# Patient Record
Sex: Female | Born: 1935 | Race: White | Hispanic: No | Smoking: Former smoker
Health system: Southern US, Community
[De-identification: ages and names within clinical notes are randomized; demographics above are authoritative.]

## PROBLEM LIST (undated history)

## (undated) DIAGNOSIS — I739 Peripheral vascular disease, unspecified: Secondary | ICD-10-CM

## (undated) DIAGNOSIS — J449 Chronic obstructive pulmonary disease, unspecified: Secondary | ICD-10-CM

## (undated) HISTORY — DX: Peripheral vascular disease, unspecified: I73.9

---

## 1968-11-02 HISTORY — PX: TUBAL LIGATION: SHX77

## 1978-11-02 HISTORY — PX: ABDOMINAL HYSTERECTOMY: SHX81

## 1998-05-17 ENCOUNTER — Ambulatory Visit: Admission: RE | Admit: 1998-05-17 | Discharge: 1998-05-17 | Payer: Self-pay | Admitting: Pulmonary Disease

## 1999-02-03 ENCOUNTER — Other Ambulatory Visit: Admission: RE | Admit: 1999-02-03 | Discharge: 1999-02-03 | Payer: Self-pay | Admitting: Gynecology

## 2000-06-18 ENCOUNTER — Encounter: Payer: Self-pay | Admitting: Internal Medicine

## 2000-06-18 ENCOUNTER — Encounter: Admission: RE | Admit: 2000-06-18 | Discharge: 2000-06-18 | Payer: Self-pay | Admitting: Internal Medicine

## 2000-07-12 ENCOUNTER — Other Ambulatory Visit: Admission: RE | Admit: 2000-07-12 | Discharge: 2000-07-12 | Payer: Self-pay | Admitting: Gynecology

## 2001-09-01 ENCOUNTER — Other Ambulatory Visit: Admission: RE | Admit: 2001-09-01 | Discharge: 2001-09-01 | Payer: Self-pay | Admitting: Gynecology

## 2001-09-08 ENCOUNTER — Encounter: Payer: Self-pay | Admitting: Gynecology

## 2001-09-08 ENCOUNTER — Encounter: Admission: RE | Admit: 2001-09-08 | Discharge: 2001-09-08 | Payer: Self-pay | Admitting: Gynecology

## 2001-12-10 ENCOUNTER — Encounter: Payer: Self-pay | Admitting: Emergency Medicine

## 2001-12-10 ENCOUNTER — Emergency Department (HOSPITAL_COMMUNITY): Admission: EM | Admit: 2001-12-10 | Discharge: 2001-12-11 | Payer: Self-pay | Admitting: Emergency Medicine

## 2001-12-11 ENCOUNTER — Encounter: Payer: Self-pay | Admitting: Emergency Medicine

## 2001-12-13 ENCOUNTER — Inpatient Hospital Stay (HOSPITAL_COMMUNITY): Admission: EM | Admit: 2001-12-13 | Discharge: 2001-12-22 | Payer: Self-pay | Admitting: Emergency Medicine

## 2001-12-13 ENCOUNTER — Encounter (INDEPENDENT_AMBULATORY_CARE_PROVIDER_SITE_OTHER): Payer: Self-pay

## 2001-12-13 ENCOUNTER — Encounter: Payer: Self-pay | Admitting: Emergency Medicine

## 2001-12-15 ENCOUNTER — Encounter: Payer: Self-pay | Admitting: Critical Care Medicine

## 2001-12-16 ENCOUNTER — Encounter: Payer: Self-pay | Admitting: Internal Medicine

## 2001-12-19 ENCOUNTER — Encounter: Payer: Self-pay | Admitting: Critical Care Medicine

## 2001-12-20 ENCOUNTER — Encounter: Payer: Self-pay | Admitting: Critical Care Medicine

## 2001-12-21 ENCOUNTER — Encounter: Payer: Self-pay | Admitting: Critical Care Medicine

## 2001-12-24 ENCOUNTER — Inpatient Hospital Stay (HOSPITAL_COMMUNITY): Admission: EM | Admit: 2001-12-24 | Discharge: 2002-01-03 | Payer: Self-pay | Admitting: Emergency Medicine

## 2001-12-24 ENCOUNTER — Encounter: Payer: Self-pay | Admitting: Emergency Medicine

## 2001-12-26 ENCOUNTER — Encounter: Payer: Self-pay | Admitting: Pulmonary Disease

## 2001-12-27 ENCOUNTER — Encounter: Payer: Self-pay | Admitting: Critical Care Medicine

## 2002-09-07 ENCOUNTER — Other Ambulatory Visit: Admission: RE | Admit: 2002-09-07 | Discharge: 2002-09-07 | Payer: Self-pay | Admitting: Gynecology

## 2002-09-18 ENCOUNTER — Encounter: Payer: Self-pay | Admitting: Gynecology

## 2002-09-18 ENCOUNTER — Encounter: Admission: RE | Admit: 2002-09-18 | Discharge: 2002-09-18 | Payer: Self-pay | Admitting: Gynecology

## 2003-10-23 ENCOUNTER — Encounter: Admission: RE | Admit: 2003-10-23 | Discharge: 2003-10-23 | Payer: Self-pay | Admitting: Gynecology

## 2004-09-18 ENCOUNTER — Encounter: Admission: RE | Admit: 2004-09-18 | Discharge: 2004-09-18 | Payer: Self-pay | Admitting: Internal Medicine

## 2004-09-23 ENCOUNTER — Inpatient Hospital Stay (HOSPITAL_COMMUNITY): Admission: EM | Admit: 2004-09-23 | Discharge: 2004-09-26 | Payer: Self-pay | Admitting: Emergency Medicine

## 2004-09-23 ENCOUNTER — Ambulatory Visit: Payer: Self-pay | Admitting: Internal Medicine

## 2004-10-01 ENCOUNTER — Ambulatory Visit: Payer: Self-pay | Admitting: Internal Medicine

## 2004-10-15 ENCOUNTER — Ambulatory Visit: Payer: Self-pay | Admitting: Internal Medicine

## 2004-11-12 ENCOUNTER — Ambulatory Visit: Payer: Self-pay | Admitting: Internal Medicine

## 2004-11-20 ENCOUNTER — Encounter: Admission: RE | Admit: 2004-11-20 | Discharge: 2004-11-20 | Payer: Self-pay | Admitting: Gynecology

## 2004-11-28 ENCOUNTER — Ambulatory Visit: Payer: Self-pay | Admitting: Internal Medicine

## 2004-12-12 ENCOUNTER — Ambulatory Visit: Payer: Self-pay | Admitting: Internal Medicine

## 2005-02-02 ENCOUNTER — Ambulatory Visit: Payer: Self-pay | Admitting: Internal Medicine

## 2005-03-17 ENCOUNTER — Ambulatory Visit: Payer: Self-pay | Admitting: Internal Medicine

## 2005-06-05 ENCOUNTER — Ambulatory Visit: Payer: Self-pay | Admitting: Internal Medicine

## 2005-06-25 ENCOUNTER — Other Ambulatory Visit: Admission: RE | Admit: 2005-06-25 | Discharge: 2005-06-25 | Payer: Self-pay | Admitting: Gynecology

## 2005-09-03 ENCOUNTER — Ambulatory Visit: Payer: Self-pay | Admitting: Internal Medicine

## 2005-10-29 ENCOUNTER — Ambulatory Visit: Payer: Self-pay | Admitting: Internal Medicine

## 2005-12-10 ENCOUNTER — Ambulatory Visit: Payer: Self-pay | Admitting: Internal Medicine

## 2006-01-06 ENCOUNTER — Encounter: Admission: RE | Admit: 2006-01-06 | Discharge: 2006-01-06 | Payer: Self-pay | Admitting: Gynecology

## 2006-02-12 ENCOUNTER — Ambulatory Visit: Payer: Self-pay | Admitting: Internal Medicine

## 2006-04-22 ENCOUNTER — Ambulatory Visit: Payer: Self-pay | Admitting: Internal Medicine

## 2006-07-06 ENCOUNTER — Ambulatory Visit: Payer: Self-pay | Admitting: Internal Medicine

## 2006-07-16 ENCOUNTER — Ambulatory Visit: Payer: Self-pay | Admitting: Pulmonary Disease

## 2006-07-26 ENCOUNTER — Ambulatory Visit: Payer: Self-pay | Admitting: Internal Medicine

## 2006-08-24 ENCOUNTER — Ambulatory Visit: Payer: Self-pay | Admitting: Internal Medicine

## 2006-10-05 ENCOUNTER — Ambulatory Visit: Payer: Self-pay | Admitting: Internal Medicine

## 2006-10-21 ENCOUNTER — Ambulatory Visit: Payer: Self-pay | Admitting: Internal Medicine

## 2006-12-02 ENCOUNTER — Encounter: Admission: RE | Admit: 2006-12-02 | Discharge: 2006-12-02 | Payer: Self-pay | Admitting: Internal Medicine

## 2006-12-03 ENCOUNTER — Emergency Department (HOSPITAL_COMMUNITY): Admission: EM | Admit: 2006-12-03 | Discharge: 2006-12-03 | Payer: Self-pay | Admitting: Emergency Medicine

## 2007-01-19 ENCOUNTER — Ambulatory Visit: Payer: Self-pay | Admitting: Internal Medicine

## 2007-03-23 ENCOUNTER — Encounter: Admission: RE | Admit: 2007-03-23 | Discharge: 2007-03-23 | Payer: Self-pay | Admitting: Gynecology

## 2007-09-05 DIAGNOSIS — J438 Other emphysema: Secondary | ICD-10-CM

## 2007-09-05 DIAGNOSIS — E039 Hypothyroidism, unspecified: Secondary | ICD-10-CM | POA: Insufficient documentation

## 2007-09-05 DIAGNOSIS — J209 Acute bronchitis, unspecified: Secondary | ICD-10-CM

## 2007-09-05 DIAGNOSIS — J449 Chronic obstructive pulmonary disease, unspecified: Secondary | ICD-10-CM

## 2007-09-05 DIAGNOSIS — M26629 Arthralgia of temporomandibular joint, unspecified side: Secondary | ICD-10-CM

## 2008-01-21 ENCOUNTER — Emergency Department (HOSPITAL_COMMUNITY): Admission: EM | Admit: 2008-01-21 | Discharge: 2008-01-21 | Payer: Self-pay | Admitting: Emergency Medicine

## 2008-04-10 ENCOUNTER — Encounter: Admission: RE | Admit: 2008-04-10 | Discharge: 2008-04-10 | Payer: Self-pay | Admitting: Gynecology

## 2009-04-18 ENCOUNTER — Encounter: Admission: RE | Admit: 2009-04-18 | Discharge: 2009-04-18 | Payer: Self-pay | Admitting: Gynecology

## 2010-03-15 ENCOUNTER — Emergency Department (HOSPITAL_COMMUNITY): Admission: EM | Admit: 2010-03-15 | Discharge: 2010-03-15 | Payer: Self-pay | Admitting: Emergency Medicine

## 2010-04-25 ENCOUNTER — Encounter: Admission: RE | Admit: 2010-04-25 | Discharge: 2010-04-25 | Payer: Self-pay | Admitting: Gynecology

## 2011-03-20 NOTE — Discharge Summary (Signed)
[facility]  Patient:    Sharon Mcdaniel, Sharon Mcdaniel Visit Number: [id number] MRN: [id number]          Service Type: MED Location: [id number] [phone] Attending Physician:  Sharon Mcdaniel Dictated by:   Sharon Favor, RN, MSN, ACNP Admit Date:  12/24/2001 Discharge Date: 01/03/2002   CC:         Sharon Mcdaniel. Sharon Mcdaniel., M.D.   Discharge Summary  DATE OF BIRTH:  19-Feb-1936.  DISCHARGE DIAGNOSIS: 1. Chronic obstructive pulmonary disease. 2. 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 3. Allergic reaction.  HISTORY OF PRESENT ILLNESS:  The patient is a 75 year old white female patient of Sharon Mcdaniel who was discharged from the hospital December 22, 2001, after a prolonged hospitalization of pneumonia complicated by acute lung injury and asthmatic bronchitis.  She was seen in the office following discharge and at that time had developed a drug rash that was noted to be on the torso.  The majority of her pharmaceuticals were discontinued at that time including hydrocodone, vioxx, Diflucan, and tequin.  Despite the change in her medications she became extremely arrhythmic to the point of being painful. She also became anxious and quite fearful, therefore she was admitted for further evaluation and treatment.  LABORATORY DATA:  ANA is negative.  WBC is 24.1, hemoglobin 12.0, hematocrit 34.9, white blood cell 455, ESR is 30.  Sodium 131, potassium 4.0, chloride 97, CO2 is 27, glucose 181.  BUN 16, creatinine 0.8, calcium 8.1, total protein 5.5, albumin 2.3, AST 33, ALT 44, ALP 110, total bilirubin 0.4, IgE serum is 10.2.  Chest x-ray 2 views shows slight suggestion of some improvement of aeration on the right with an area of patchy dense lesions in the left upper lobe regions which may represent increased bronchopneumonia, dated December 26, 2001.  CT of the chest on December 27, 2001, further improvement of right upper  lobe pneumonia, almost complete, clearing the right middle lobe pneumonia, increased interstitial pneumonitis in both upper lobes in the superior segment of the left lower lobe and may be secondary to patients allergic reaction to antimicrobial therapy.  HOSPITAL COURSE:  #1 - CHRONIC OBSTRUCTIVE PULMONARY DISEASE WITH RECENT PNEUMONIA:  Sharon Mcdaniel was admitted to [facility] after having been treated earlier in the month with pneumonia and acute lung injury.  She continued on prednisone therapy and it was no longer on antimicrobial therapy secondary to an allergic reaction.  She was treated with nebulized bronchodilators along with prednisone therapy and required oxygen therapy for O2 saturations of 86 on room air.  She reached maximal hospital benefit by discharge day and was discharged home in improved condition.  #2 - ACUTE LUNG INJURY VERSUS BRONCHIOLITIS OBLITERANS ORGANIZED PNEUMONIA: The patient was started on high dose steroids for suspected BOOP.  She was continued on a very slow steroid taper after discharge.  CT scan was suggestive of allergic respiratory inflammatory response therefore again she will be on a slow steroid taper.  #3 - ALLERGIC SKIN REACTION:  The patient was evaluated in the office and in the hospital with a truncal rash that was extremely uncomfortable for her. She started on high dose IV steroids and then on slow p.o. steroid taper.  Her rash that was truncal and maculopapular in nature had been completely obliterated by the day of discharge and therefore no longer a contributing factor to her illness by the day of discharge.  MEDICATIONS: 1. Oxygen 2 liters 24 hours a day.  2. Albuterol 2.5 hand-held nebulizer 4 times a day. 3. Atrovent 0.5 hand-held nebulizer 4 times a day. 4. Synthroid 125 mcg 1 q.d. 5. Ventolin 20 mg 1 q.d. 6. Climara patch 0.05 mg change daily. 7. Atarax 50 mg 1 tab q.8h. p.r.n. as needed for rash. 8. Prednisone 10 mg  tablets 40 mg for 5 days, 35 mg for 5 days, 30 mg for 5    days, 25 mg for 5 days, 20 mg a day and then stay.  ACTIVITY:  As tolerated.  DIET:  No restriction.  WOUND CARE:  Not applicable.  FOLLOWUP:  Follow up with Sharon Favor, RN, MSN, ACNP, on January 06, 2002.  CONDITION ON DISCHARGE:  She was discharged home in improved condition.  Her rash is completely resolved.  Her hypoxia has been addressed with the continuation of her oxygen.  Her chronic obstructive pulmonary disease has been addressed with nebulized bronchodilators.  She is discharged home in improved condition. Dictated by:   Sharon Favor, RN, MSN, ACNP Attending Physician:  Sharon Mcdaniel DD:  01/09/02 TD:  01/09/02 Job: [id number]

## 2011-03-20 NOTE — Assessment & Plan Note (Signed)
[facility]                               PULMONARY OFFICE NOTE   Sharon Mcdaniel, Sharon Mcdaniel                        MRN:          [id number]  DATE:07/26/2006                            DOB:          Oct 16, 1936    This is a pulmonary/acute extended office visit.   HISTORY:  A 75 year old white female who was just seen on September 4 with  relatively well controlled COPD, albeit with a 15% improvement after  bronchodilators consistent with an asthmatic component for which she has  been maintained on Pulmicort nebulizer 0.25 mg b.i.d. along with DuoNeb  b.i.d. plus extra doses p.r.n.  She comes in today acutely worse since the  13th with sore throat, congestion, fever, yellow sputum, for which she was  seen by a nurse practitioner on the 14th and given a course of Omnicef.  She  had already placed herself on a tapering course of prednisone, Mucinex DM,  and tramadol.   She comes back today saying she is no better.  Most of her problem is loss  of voice now and also sensation of head and sinus congestion.  She denies  any dyspnea, fever, chills, sweats, orthopnea, PND, chest pain, or leg  swelling.   For a full list of her medications, please see face sheet dated July 26, 2006.   PHYSICAL EXAMINATION:  GENERAL:  She is a pleasant, ambulatory white female  in no acute distress.  VITAL SIGNS:  She is afebrile with normal vital signs.  HEENT:  Moderate __________ oropharynx.  She does have upper and lower  dentures in place but no thrush.  NECK:  Supple without cervical adenopathy or tinnitus.  LUNGS:  Lung fields reveal inspiratory and expiratory wheezes that are  mostly upper in airway and more pseudo than true.  HEART:  Regular rhythm without murmur, rub or gallop.  ABDOMEN:  Soft, benign.  EXTREMITIES:  Warm without calf tenderness, clubbing, cyanosis or edema.   IMPRESSION:  Upper and lower airway instability, consistent with rhinitis,  sinusitis, and a component of asthmatic bronchitis in a patient previously  documented to have chronic obstructive pulmonary disease with significant  reversible component despite being maintained on Pulmicort, albeit in low  doses, by our most recent PFTs dated July 06, 2006.   There are multiple issues today that I discussed with the patient as follows  and in writing:  1)  We have not eradicated the purulent secretions from her  head, although she has no sore throat of fever, she continues to have marked  hoarseness and yellow secretions.  I recommended Levaquin 750 for five more  days and then follow up with a sinus CT scan if her symptoms are not better.  2)  Chronic obstructive pulmonary disease with an asthma exacerbation.  I  reviewed with her an optimal way to taper prednisone off.  3)  Because there  is so much upper airway stability, she needs to continue to use Zegerid, not  only when she has symptoms of acid reflux but also for any upper respiratory  symptoms.  I specifically gave her samples and asked her to take 40 mg per  day until completely better off prednisone and back to baseline.  4)  Finally, because she still has an asthmatic component based on her most  recent PFTs, I am going to increase the Pulmicort dose to 0.5 b.i.d.  and  review this issue as well with her in writing.   Followup will be at her regularly scheduled appointment unless she fails to  respond to the above measures back to baseline.                                   Sharon Mcdaniel. Sharon Sires, MD, [facility]   MBW/MedQ  DD:  07/26/2006  DT:  07/27/2006  Job #:  [id number]   cc:   [name], M.D.

## 2011-03-20 NOTE — H&P (Signed)
[facility]  Patient:    Sharon Mcdaniel, Sharon Mcdaniel Visit Number: [id number] MRN: [id number]          Service Type: MED Location: [phone] Attending Physician:  Caleb Popp Dictated by:   Oley Balm Sung Amabile, M.D. [facility] Admit Date:  12/24/2001   CC:         Sharon Mcdaniel. Sharon Mcdaniel., M.D.  Sharon Mcdaniel, M.D. [facility]   History and Physical  DATE OF BIRTH:  May 20, 1936  ADMISSION DIAGNOSIS:  Severe drug eruption.  HISTORY OF PRESENT ILLNESS:  [name] was discharged home on December 22, 2001, after a prolonged hospitalization for pneumonia complicated by acute lung injury and asthmatic bronchitis.  On the day following discharge and on the day prior to this admission she was seen in the office with a mild rash. It was felt to be a drug rash, and she was told to discontinue the antibiotic, Tequin.  She was also told to discontinue hydrocodone, Vioxx, and Diflucan. The rash continued to worsen and became extremely erythematous and painful. It affects her trunk predominantly.  It also itches.  She was instructed on the day prior to this evaluation to come to the emergency department if she developed any shortness of breath.  She did, indeed, develop shortness of breath on the morning of admission and, consequently, was seen in the emergency department where she was noted to have room air oxygen saturation of 89%.  This improved some after nebulized bronchodilators.  At this point she is quite anxious and fearful of going back home until the present problem resolves sufficiently.  Therefore, she is admitted for further evaluation and management.  She denies any further fevers, chills, or sweats.  She has no chest pain, pressure, tightness, or heaviness.  There is no cough or sputum production.  No nausea, vomiting, diarrhea, or dysuria.  She does note generalized weakness since her discharge, not surprising after a 12-day hospitalization with a serious  illness.  She also notes dyspnea, as described above.  PAST MEDICAL HISTORY: 1. Hypothyroidism. 2. Asthmatic bronchitis. 3. Also during her hospitalization she did have syndrome of inappropriate    antidiuretic hormone secretion which was mild and was resolving by the    time of discharge.  MEDICATIONS:  Two days ago she was discharged home on: 1. Nebulized bronchodilators.  2. Remeron 7.5 mg q.h.s.  3. Synthroid 125 mcg q.d.  4. Os-Cal + D 500 mg q.d.  5. Protonix 40 mg q.d.  6. Climera 0.5 mg patch twice a week.  7. Diflucan 100 mg p.o. q.d.  8. Tequin 400 mg p.o. q.d.  9. Vioxx 25 mg p.o. q.d. 10. Humibid L.A. 600 mg b.i.d. 11. Pulmicort 4 inhalations b.i.d. 12. Vicodin 1 p.o. q.6h. p.r.n. 13. Prednisone taper.  SOCIAL HISTORY:  She smoked a pack of cigarettes a day up until the time of her recent hospitalization.  Therefore, she has been off of cigarettes for two weeks now.  She has no history of significant occupational or environmental exposure.  FAMILY HISTORY:  This has been reviewed previously and is noncontributory.  REVIEW OF SYSTEMS:  This is as per the history of present illness.  PHYSICAL EXAMINATION:  VITAL SIGNS:  Afebrile, normal vital signs.  Oxygen saturation is 94% on 2 L by nasal cannula.  GENERAL:  She is somewhat anxious-appearing but in no acute cardiac or respiratory distress.  HEENT:  No acute abnormalities.  NECK:  Supple.  Without adenopathy or  jugular venous distention.  CHEST:  Normal percussion noted throughout.  Breath sounds are mildly to moderately diminished.  No wheezes or other adventitious sounds.  She did have some rhonchi, which cleared nicely with cough.  No findings of consolidation are noted.  CARDIAC:  Regular rate and rhythm, with no murmurs.  ABDOMEN:  Soft, with normal bowel sounds.  EXTREMITIES:  No clubbing, cyanosis, or edema.  NEUROLOGIC:  Cranial nerves II-XII intact.  Motor and sensory are normal. Deep  tendon reflexes are 2+ and symmetric.  SKIN:  Diffuse confluent, erythematous rash on the trunk with relative sparing of the extremities, neck, and face.  LABORATORY DATA:  Chest x-ray reveals hyperinflation consistent with emphysema.  She has a minimal residual nodular infiltrate in the right upper lobe.  Her chest x-ray is markedly improved compared to prior films during her recent hospitalization.  Normal chemistries except sodium of 132.  CBC is normal except for a white blood cell count of 17,000 with a predominant of neutrophils.  Eosinophil count is 500.  IMPRESSION AND PLAN: 1. Classic-appearing drug rash:  She was discharged on a multitude of    medications, none of which she had been on previously.  She did have    documented previous allergy to CODEINE, though this was a nonspecific    reaction of lightheadedness.  I think the most likely culprits are Tequin,    Vioxx, hydrocodone, and Diflucan.  Far less likely would be the medications    Protonix, prednisone, or any of the other medications listed above.  For    now, all medications will be discontinued as I really do not think that she    has a need for any of these medications on a chronic basis.  She will be    maintained on inhaled bronchodilators.  She will be treated with    around-the-clock steroids and histamine blockers. 2. Chronic obstructive pulmonary disease:  Low-flow oxygen and bronchodilators    will be administered. 3. Hypothyroidism:  Synthroid will be continued. Dictated by:   Oley Balm Sung Amabile, M.D. [facility] Attending Physician:  Caleb Popp DD:  12/24/01 TD:  12/25/01 Job: [phone] [id number]

## 2011-03-20 NOTE — Assessment & Plan Note (Signed)
[facility]                             PULMONARY OFFICE NOTE   Sharon, Mcdaniel                        MRN:          [id number]  DATE:10/05/2006                            DOB:          Nov 30, 1935    PULMONARY/FOLLOWUP OFFICE VISIT:  The patient is a 75 year old white  female, status post successful smoking cessation in 2003, and generally  doing much better with COPD with asthmatic component on her combination  Pulmicort 0.5 mg b.i.d. plus DuoNeb b.i.d. and then she does have extra  DuoNeb doses to take p.r.n., but has not needed them at all.  She has  had no problems this fall with any flare-ups of coughing, dyspnea or  subjective wheeze on this regimen, which was reviewed with her in  detail.  Note that she did travel to [place] and back without any  problem.   She also has multiple p.r.n. medications, but has not found anything but  occasional nasal saline needed from her list of p.r.n. medications.   PHYSICAL EXAMINATION:  GENERAL:  She is a pleasant, ambulatory, white  female, in no acute distress.  VITAL SIGNS:  Afebrile, normal vital signs.  HEENT reveals minimal nonspecific turbinate edema.  Oropharynx is clear.  No evidence of excessive postnasal drainage.  NECK:  Supple, without cervical adenopathy or tenderness.  Trachea is  midline without thyromegaly.  LUNGS:  Lung fields reveal minimal rhonchi bilaterally, both inspiratory  and expiratory in the bases posteriorly, more in the bases.  Overall air  movement is, however, adequate.  There is regular rhythm without murmur,  gallop or rub.  ABDOMEN:  Soft, benign.  EXTREMITIES:  Warm without calf tenderness, cyanosis, clubbing or edema.   IMPRESSION:  Chronic obstructive pulmonary disease of moderate nature  with definite improvement after bronchodilators, documented July 06, 2006.  My feeling is as long as she maintains off cigarettes and we  continue to treat the asthmatic  component with topical steroids, she  should do fine with no decline in best-day performance nor increased  need for DuoNeb over baseline.   One option would be for the patient to stay on the same regimen and see  Dr. Lendell Caprice for refill or return here for refills.  If this is the  case, I will see her every three to four months.  Alternatively, I would  be happy to defer the refills to Dr. Pincus Sanes capable hands,  especially since the patient has been so stable since she quit smoking.    [name]. [name], MD, [facility]  Electronically Signed   MBW/MedQ  DD: 10/05/2006  DT: 10/06/2006  Job #: [id number]   cc:   [name], M.D.

## 2011-03-20 NOTE — Discharge Summary (Signed)
[facility]  Patient:    Sharon Mcdaniel, Sharon Mcdaniel Visit Number: [id number] MRN: [id number]          Service Type: MED Location: 3W [phone] Attending Physician:  Vashti Hey Dictated by:   Earley Favor, RN, MSN, ACNP Admit Date:  12/13/2001 Disc. Date: 12/22/01   CC:         [name], M.D.   Discharge Summary  DATE OF BIRTH:  May 20, 1936  DISCHARGE DIAGNOSES: 1. Community acquired pneumonia, organisms unspecified. 2. Acute lung injury. 3. Asthmatic bronchitis. 4. Debilitation. 5. Syndrome of inappropriate antidiuretic hormone secretion.  HISTORY OF PRESENT ILLNESS:  Sharon Mcdaniel is a 75 year old white female with past medical history of tobacco abuse who developed right shoulder pain for three days prior to admission.  She was seen in the [facility] Emergency Department and chest x-ray done which showed possible right middle lobe mass. Follow up CT showed that this is most likely secondary to inflammatory process.  She was given Zithromax for her shortness of breath.  Cough progressed.  Her cough is productive of brownish thick phlegm.  She has had bronchitis times two in the past year and with pneumonia in late December. Due to the refractory nature of her disease she was admitted for further evaluation by Dr. Dimas Alexandria.  Due to her continued pulmonary symptomatology, pulmonary critical care was asked to assume her care, ie Dr. Shan Levans.  LABORATORY DATA:  Legionella via sputum is negative.  Bronchoalveolar lavage demonstrates mild yeast.  Arterial blood gas 21%, pH 7.44, pCO2 40, pO2 61. WBC is 22.9, RBC 3.59, hemoglobin 10.8, hematocrit 30.9, platelets 383.  INR is 1.1, PTT 39.  Sodium is 131, potassium 4.8, chloride 95, CO2 29, BUN 19, creatinine 0.9, glucose 103.  Of note, her sodium reached a low of 124 on December 14, 2001.  It reached a high of 134 on December 19, 2001.  AST is 27, ALT 22,  ALP 123.  CK 29, CK MB 6.9, troponin I 0.02.  Blood cultures were negative times two.  Sputum evaluation was negative for Legionella.  Sputum culture positive for abundant yeast.  No AFB was appreciated.  Bronchoalveolar lavage was negative.  CT of the chest shows no pulmonary emboli.  Improved right upper lobe pneumonia.  Interval development pneumonitis left upper lobe, left lower lobe, right middle lobe and right lower lobe.  Chest x-ray shows continued improvement in bilateral pneumonia, only mild residual right upper lobe consolidation persistent.  Bilateral pleural effusions have decreased in size from four days ago.  Post bronchoscopy chest x-ray shows diffuse bilateral pneumonia, superimposed pulmonary edema suspected.  PROCEDURES:  Fiberoptic bronchoscopy per Dr. Shan Levans which showed diffuse tracheal bronchitis without any overt endobronchial lesions.  HOSPITAL COURSE:  #1 - COMMUNITY ACQUIRED PNEUMONIA, NO ORGANISMS SPECIFIED:  Sharon Mcdaniel was admitted to [facility] on December 13, 2001 initially by Dr. Dimas Alexandria.  Pulmonary critical care was asked to follow due to her extensive pneumonia.  She underwent a fiberoptic bronchoscopy with bronchoalveolar lavage being negative.  Airway inspection showed diffuse tracheal bronchitis without overt endobronchial lesions.  She was maintained on appropriate antibiotics along with steroid therapy.  She reached maximal hospital benefit by December 22, 2001 and was discharged home.  #2 - ACUTE LUNG INJURY:  Acute lung injury was evident on CT scan and chest x-rays.  This again was being treated with slow steroid taper.  Her pO2 was adequate.  She does not require supplemental oxygen at this time.  #3 - ASTHMATIC BRONCHITIS:  Asthmatic bronchitis is being addressed with continued antimicrobial therapy along with nebulized bronchodilators and the addition of high dose steroids.  #4 - DEBILITATION:  Debilitation is  being addressed with Home Health R.N. and Home Health PT.  #5 - SYNDROME OF INAPPROPRIATE ANTIDIURETIC HORMONE SECRETION:  Sodium had reached a low of 124.  It was 131 on the day of discharge.  Will be monitored on an outpatient basis.  DISCHARGE MEDICATIONS:  She will be on albuterol and Atrovent 2.5 and 0.5 respectively four times a day.  Remeron 1/2 of 15 mg tablet q.h.s.   Synthroid 120 mcg q.d.  Os-Cal Plus D 500 mg one q.d.   Protonix 40 mg one q.d.  Climara 0.5 mg patch change twice weekly use for villa.  Diflucan 100 mg one q.d. Tequin 400 mg one q.d.  Vioxx 25 mg one q.d.   Humibid LA 2 tablets b.i.d. Pulmicort four puffs b.i.d.  Vicodin one tablet q.6h. p.r.n. for pain. Prednisone on taper 60 mg for four days, 50 mg for four days, 40 mg for four days, 30 mg for four days, 20 mg for four days, 10 mg for four days and then stop.  She has a Home Health PT follow up with [facility].  DIET:  As tolerated.  No restrictions.  FOLLOW-UP:  Appointment with Nurse Practitioner Minor on December 29, 2001 at 2:15 p.m. with a follow up chest x-ray.  DISPOSITION/CONDITION ON DISCHARGE:  The patient no longer requires supplemental oxygen.  Pulmonary status is improved.  She is able to ambulate, although she is weakened from her prolonged hospitalization.  Her other medical problems have been addressed.  Her hypernatremia has been evaluated. She is being discharged home in improved condition. Dictated by:   Earley Favor, RN, MSN, ACNP Attending Physician:  Vashti Hey DD:  12/22/01 TD:  12/22/01 Job: [id number]

## 2011-03-20 NOTE — Discharge Summary (Signed)
NAME:  Sharon Mcdaniel, Sharon Mcdaniel                 ACCOUNT NO.:  [id number]   MEDICAL RECORD NO.:  [id number]          PATIENT TYPE:  INP   LOCATION:  4734                         FACILITY:  [facility]   PHYSICIAN:  [name]. [name], M.D. [facility] OF BIRTH:  08/11/1936   DATE OF ADMISSION:  09/23/2004  DATE OF DISCHARGE:  09/26/2004                                 DISCHARGE SUMMARY   FINAL DIAGNOSES:  1.  Chronic obstructive pulmonary disease exacerbation, with secondary      hypoxemic respiratory failure.  2.  Chronic rhinitis, by CT scan this admission; with no evidence of acute      sinusitis.  3.  Hypothyroidism.  Noted, her TSH was 0.094 this admission and may need to      be adjusted downward as an outpatient.   HISTORY:  Please see H&P.  This is a 75 year old white female, former  smoker, with her first recent episode of COPD exacerbation and associated  with retained secretions.  These were initially purulent; they were  refractory to Biaxin and seemed to clear on Ketek in terms of color, but did  have continued difficulty with severe coughing paroxysms and very congested  cough.  She was therefore admitted for inpatient therapy.   HOSPITAL COURSE:  She received a combination of Solu-Medrol, round-the-clock  nebulizers, supplemental oxygen, as well as Tramadol to eliminate (what I  thought) was a component of functional/cyclical coughing; and, was also  taught how to use a flutter valve.   DISPOSITION:  She dramatically improved during the admission, to the point  where she was ambulate in the hallway without significant dyspnea or cough.  Therefore, she is discharged in improved condition; to maintain a regular  diet and increase activity as tolerated.   LABORATORY DATA:  (On admission) White count 17,000, hematocrit 41%, sodium  128 (which improved to 136 during the hospitalization, with normal BMET at  time of discharge).  Normal BNP.  TSH 0.094 (suggesting excessive  suppression with her  present dose of Synthroid, which will be adjusted as an  outpatient).   DISCHARGE MEDICATIONS:  1.  Ketek take two after supper until gone.  2.  Prednisone four tablets q.a.m. for three days; then three for three      days; two for three days and one for three days then off.  3.  Albuterol and Atrovent per nebulizer q.i.d. (she already has this).  4.  Synthroid 125 mcg (as before) one q.d.  5.  __________ patch as before.  6.  __________ XDM 2B ID.  7.  Protonix 40 mg before breakfast empirically to cover reflux, as the      cause of her refractory cough.  8.  Tramadol 50 mg q.i.d. to eliminate cyclical coughing.  9.  Flutter valve training.  She can use this up to every hour.  10. Oxygen to be worn at bedtime and then p.r.n. during day (This has      already been arranged prior to discharge, so we did not do a nocturnal      pulse oximetry prior to discharge on  room air; however, we did ambulate      her with adequate saturations documented while walking the hallway).      Oxygen during the day should be p.r.n.   FOLLOWUP:  In one week.  She is asked to bring all of her medications, and  will at that time determine long-term recommendations.       MBW/MEDQ  D:  09/26/2004  T:  09/26/2004  Job:  [id number]

## 2011-03-20 NOTE — Procedures (Signed)
[place]  Patient:    Sharon Mcdaniel, Sharon Mcdaniel Visit Number: [id number] MRN: [id number]          Service Type: MED Location: 3W [phone] Attending Physician:  Vashti Hey Dictated by:   Charlcie Cradle Delford Field, M.D. [facility] Proc. Date: 12/19/01 Admit Date:  12/13/2001   CC:         Marcy Salvo C. Eloise Harman., M.D.   Procedure Report  PROCEDURE PERFORMED:  Bronchoscopy.  ENDOSCOPIST:  Charlcie Cradle. Delford Field, M.D. [facility]  CHIEF COMPLAINT:  Bilateral pulmonary infiltrates, evaluate for cause.  ANESTHESIA:  1% Xylocaine local.  PREOP MEDICATIONS:  Versed 4 mg IV push.   Demerol 30 mg IV push.  DESCRIPTION OF PROCEDURE:  The Pentax video bronchoscope was introduced through the right naris.  The upper airways were visualized and were unremarkable.  The entire tracheobronchial tree was visualized and revealed diffuse tracheobronchitis with mucous plugging.  No endobronchial lesions were seen.  Particularly, attention was paid to the right upper lobe and right middle lobe orifices and these areas were free of endobronchial lesion or obstruction.  Bronchial washings were obtained from the right middle lobe.  COMPLICATIONS:  None.  IMPRESSION:  Community acquired pneumonia with associated asthmatic bronchitis and mucous plugging.  RECOMMENDATIONS:  Continue antibiotic therapy, corticosteroid therapy and follow patient expectantly. Dictated by:   Charlcie Cradle Delford Field, M.D. [facility] Attending Physician:  Vashti Hey DD:  12/19/01 TD:  12/19/01 Job: [id number]

## 2011-03-20 NOTE — Assessment & Plan Note (Signed)
[facility]                               PULMONARY OFFICE NOTE   Sharon Mcdaniel, Sharon Mcdaniel                        MRN:          [id number]  DATE:07/06/2006                            DOB:          [birth year]    HISTORY:  A 75 year old white female with severe COPD with minimum asthmatic  component, returns for follow-up stating she can do pretty much everything  she wants as long as she paces herself.  When I asked her if there were  things that she would like to do more of or faster, she said, No, I've  gotten used to living with limitations.   She denies any nocturnal wheeze or variability in terms of exercise  tolerance, fevers, chills, sweats, exertional chest pain, PND or leg  swelling or significant morning coughing.   I did review all the medications in detail on the face sheet column dated  July 06, 2006, noting that her nocturnal oxygen has been stopped since  her previous visit but she continues to use both Pulmicort 0.25 mg and  DuoNeb b.i.d. with minimum need for a rescue therapy.   PHYSICAL EXAMINATION:  GENERAL:  She is a somber but not overtly depressed  ambulatory white female in no acute distress.  VITAL SIGNS:  She has stable vital signs.  HEENT:  Unremarkable.  NECK:  Clear.  CHEST:  Lung fields reveal diminished breath sounds with no wheezing.  CARDIAC:  There is a regular rhythm without murmur, gallop, or rub.  ABDOMEN:  Soft, benign.  EXTREMITIES:  Warm without calf tenderness, cyanosis, clubbing or edema.   Chest x-ray shows COPD only.  PFTs show no decline in FEV1 of around 1.11 or  58% of predicted, since her previous study.  She does have a 15% improvement  after bronchodilators, after last using her nebulizer greater than 6 hours  ago.   IMPRESSION:  Chronic obstructive pulmonary disease with a definite asthmatic  component that could be treated more aggressively if the patient were more  enthusiastic about a higher  level of activity.  Specifically, the option  might be to use q.i.d. nebulizers or switch her over to combination therapy  with Advair and Symbicort to control the asthmatic component to the  __________ more aggressively.  However, the patient is happy with her  present level of function and I left this as an option for later if she  develops any interest in higher-level activity  and needs a higher ventilatory ceiling than presently afforded by the  b.i.d. triple combination therapy she is using.                                   [name]. [name], MD, [facility]   MBW/MedQ  DD:  07/06/2006  DT:  07/07/2006  Job #:  [id number]   cc:   [name], M.D.

## 2011-03-20 NOTE — Assessment & Plan Note (Signed)
[facility]                             PULMONARY OFFICE NOTE   Sharon Mcdaniel, Sharon Mcdaniel                        MRN:          [id number]  DATE:10/21/2006                            DOB:          1936-10-16    PULMONARY/EXTENDED FOLLOW UP OFFICE VISIT.   HISTORY:  This is a very nice 75 year old white female remote smoker  with moderately severe COPD, most recent FEV1 at 58% predicted  documented July 06, 2006.  Comes in today with her first recent  exacerbation with coughing with green sputum production and increased  dyspnea with exertion but not at rest.  She has already followed  instructions regarding the optimal use of Mucinex, tramadol and Nasacort  along with Afrin to improve nasal congestion.   PHYSICAL EXAMINATION:  She is a pleasant ambulatory white female in no  acute distress.  She is afebrile, normal vital signs.  HEENT:  Significant for a full set of dentures.  There is moderate  turbinate edema with erythema and slightly mucopurulent secretions  present with cobblestoning in the upper airway as well.  LUNG FIELDS:  Reveal diminished breath sounds with trace expiratory  wheeze.  Upper airway movement was adequate though.  There was a regular  rate and rhythm without murmur, gallop or rub.  ABDOMEN:  Soft, benign.  EXTREMITIES:  Warm without any calf tenderness, cyanosis, clubbing,  edema.   IMPRESSION:  Acute exacerbation of chronic obstructive pulmonary disease  in the setting of purulent rhinitis/tracheobronchitis.  Despite what  would otherwise be a typical trigger that might land her in the  hospital, she is actually doing well using the contingency plans that  were reviewed with her in writing.  The only changes I made were as  follows:  1. Augmentin 875 for 10 days with precautions regarding trying to      avoid diarrhea and yeast infection by eating yogurt between doses.  2. I reviewed with her in detail in writing both her  maintenance and      her p.r.n.'s from the medication calendars, I spent an extra 15      minutes of a 25 minute visit, in the event that her COPD continues      to exacerbate despite a short course of prednisone that was also      prescribed, 10 mg tablets, #14, to be tapered off over 6 days.      Overall though I think given how well she has already begun using      the medication calendar she should be able to get herself through      this acute illness without the requirement for hospitalization,      which was the rule, rather than the exception, on her previous      exacerbations.   FOLLOW UP:  Every 3 months as previously scheduled.     [name]. [name], MD, [facility]  Electronically Signed   MBW/MedQ  DD: 10/21/2006  DT: 10/22/2006  Job #: [id number]   cc:   [name], M.D.

## 2011-03-20 NOTE — Assessment & Plan Note (Signed)
[facility]                               PULMONARY OFFICE NOTE   Sharon Mcdaniel, Sharon Mcdaniel                        MRN:          [id number]  DATE:07/16/2006                            DOB:          08-Jul-1936    HISTORY OF PRESENT ILLNESS:  The patient is a 75 year old white female  patient of Dr. Thurston Hole, who has a known history of COPD and asthmatic  bronchitis, presents with a 1-week history of productive cough with thick,  yellow sputum, sore throat, nasal congestion and wheezing.  The patient  denies any hemoptysis, chest pain, orthopnea, PND or leg swelling.  The  patient has started using her p.r.n. medications, including Mucinex DM,  tramadol and a prednisone taper, beginning with 20 mg.  The patient reports  that she continues to have thick, yellow sputum.   PHYSICAL EXAMINATION:  The patient is a pleasant female in no acute  distress.  She is afebrile, stable vital signs.  HEENT:  Nasal mucosa shows mild erythema.  The posterior pharynx is clear.  NECK:  Supple without adenopathy.  LUNGS:  Lung sounds reveal coarse breath sounds bilaterally.  No wheezing  noted.  CARDIAC:  Regular rate and rhythm.  ABDOMEN:  Soft and benign.  EXTREMITIES:  Warm without any edema.   IMPRESSION AND PLAN:  Acute exacerbation of asthmatic bronchitis.  The  patient is to begin [facility] for 10 days.  Continue on her regimen of Mucinex  DM, tramadol and a prednisone taper as recommended.  The patient will return  here as scheduled with Dr. [name], or sooner if needed.                                   Rubye Oaks, NP                                [name]. [name], MD, [facility]   TP/MedQ  DD:  07/16/2006  DT:  07/18/2006  Job #:  [id number]

## 2011-03-20 NOTE — Assessment & Plan Note (Signed)
[facility]                             PULMONARY OFFICE NOTE   Sharon Mcdaniel, Sharon Mcdaniel                        MRN:          [id number]  DATE:01/19/2007                            DOB:          11/07/1935    This is a final follow-up office visit.   HISTORY:  This is a very nice 75 year old white female former smoker who  returns as requested for regular follow-up but doing great on the  combination of Pulmicort 0.5 b.i.d. along with b.i.d. Duo-Neb with the  option to use extra Duo-Neb if needed during the day but never needs it.  She denies any significant dyspnea or cough with excellent activity  level.  For a full inventory of medication please see the face sheet  dated January 19, 2007.  Reviewed the patient in detail and corrected as listed with minimum need  for the p.r.n.'s listed at the bottom of the sheet.   PHYSICAL EXAMINATION:  She is a pleasant ambulatory white female in no  acute distress. She has normal vital signs.  HEENT: Unremarkable, anicteric sclerae.  LUNGS: Lung fields reveal diminished breath sounds bilaterally, no  wheezing.  HEART: Regular rhythm without murmurs, rubs, or gallops.  ABDOMEN: Soft, benign.  EXTREMITIES: Warm without calf tenderness, no clubbing, cyanosis or  edema.   Pulmonary function tests reviewed from July 06, 2006 indicated an  FEV1 of 58% predicted with a ratio of 56% consistent with moderate COPD  and 15% improvement after bronchodilator consistent with an asthmatic  component.   IMPRESSION:  This patient has chronic obstructive pulmonary disease with  a definite asthmatic component that seems to be benefitting from the  combination of  Duo-Neb and Pulmicort b.i.d. with minimum use of p.r.n.  Duo-Neb in between the b.i.d. dosing. She has met all criteria for  optimal management of airways disease and therefore follow-up can be on  a p.r.n. basis.   I did go over her contingency plans with her  including the increased Duo-  Neb, Mucinex, and also short courses of prednisone if needed for  exacerbations plus Augmentin to have on hand for a 10-day course p.r.n.  purulent sputum.   Pulmonary follow-up can be p.r.n. or for refills which ever comes first,  or her refills can be arranged through her primary care physician.     [name]. [name], MD, [facility]  Electronically Signed    MBW/MedQ  DD: 01/19/2007  DT: 01/19/2007  Job #: [id number]

## 2011-07-03 ENCOUNTER — Other Ambulatory Visit: Payer: Self-pay | Admitting: Orthopedic Surgery

## 2011-07-03 DIAGNOSIS — M549 Dorsalgia, unspecified: Secondary | ICD-10-CM

## 2011-07-04 HISTORY — PX: BACK SURGERY: SHX140

## 2011-07-07 ENCOUNTER — Ambulatory Visit
Admission: RE | Admit: 2011-07-07 | Discharge: 2011-07-07 | Disposition: A | Discharge status: Home / Self Care | Payer: Medicare Other | Source: Ambulatory Visit | Attending: Orthopedic Surgery | Admitting: Orthopedic Surgery

## 2011-07-07 DIAGNOSIS — M549 Dorsalgia, unspecified: Secondary | ICD-10-CM

## 2011-07-07 DIAGNOSIS — M545 Low back pain: Secondary | ICD-10-CM

## 2011-07-07 MED ORDER — IOHEXOL 180 MG/ML  SOLN
15.0000 mL | Freq: Once | INTRAMUSCULAR | Status: AC | PRN
  Administered 2011-07-07: 15 mL via INTRATHECAL

## 2011-07-07 MED ORDER — DIAZEPAM 2 MG PO TABS
5.0000 mg | ORAL_TABLET | Freq: Once | ORAL | Status: AC
  Administered 2011-07-07: 5 mg via ORAL

## 2011-07-07 NOTE — Progress Notes (Addendum)
Pt states she took 13-hour prep as prescribed, including Benadryl at 8:30 this morning.  jkl

## 2011-07-07 NOTE — Progress Notes (Signed)
Patient resting quietly without complaint.

## 2011-07-10 ENCOUNTER — Inpatient Hospital Stay (INDEPENDENT_AMBULATORY_CARE_PROVIDER_SITE_OTHER)
Admission: RE | Admit: 2011-07-10 | Discharge: 2011-07-10 | Disposition: A | Discharge status: Home / Self Care | Payer: Medicare Other | Source: Ambulatory Visit | Attending: Family Medicine | Admitting: Family Medicine

## 2011-07-10 DIAGNOSIS — T50995A Adverse effect of other drugs, medicaments and biological substances, initial encounter: Secondary | ICD-10-CM

## 2011-07-10 DIAGNOSIS — I10 Essential (primary) hypertension: Secondary | ICD-10-CM

## 2011-07-23 ENCOUNTER — Ambulatory Visit (HOSPITAL_COMMUNITY)
Admission: RE | Admit: 2011-07-23 | Discharge: 2011-07-23 | Disposition: A | Discharge status: Home / Self Care | Payer: Medicare Other | Source: Ambulatory Visit | Attending: Orthopedic Surgery | Admitting: Orthopedic Surgery

## 2011-07-23 ENCOUNTER — Other Ambulatory Visit: Payer: Self-pay | Admitting: Orthopedic Surgery

## 2011-07-23 ENCOUNTER — Encounter (HOSPITAL_COMMUNITY): Payer: Medicare Other

## 2011-07-23 DIAGNOSIS — Z01812 Encounter for preprocedural laboratory examination: Secondary | ICD-10-CM | POA: Insufficient documentation

## 2011-07-23 DIAGNOSIS — M4800 Spinal stenosis, site unspecified: Secondary | ICD-10-CM | POA: Insufficient documentation

## 2011-07-23 DIAGNOSIS — M47817 Spondylosis without myelopathy or radiculopathy, lumbosacral region: Secondary | ICD-10-CM | POA: Insufficient documentation

## 2011-07-23 DIAGNOSIS — Z01818 Encounter for other preprocedural examination: Secondary | ICD-10-CM | POA: Insufficient documentation

## 2011-07-23 DIAGNOSIS — M545 Low back pain, unspecified: Secondary | ICD-10-CM | POA: Insufficient documentation

## 2011-07-23 DIAGNOSIS — M48061 Spinal stenosis, lumbar region without neurogenic claudication: Secondary | ICD-10-CM

## 2011-07-23 LAB — COMPREHENSIVE METABOLIC PANEL
AST: 18 U/L (ref 0–37)
Albumin: 3.7 g/dL (ref 3.5–5.2)
Alkaline Phosphatase: 55 U/L (ref 39–117)
BUN: 12 mg/dL (ref 6–23)
CO2: 30 mEq/L (ref 19–32)
Chloride: 91 mEq/L — ABNORMAL LOW (ref 96–112)
Creatinine, Ser: 0.74 mg/dL (ref 0.50–1.10)
GFR calc non Af Amer: >60 mL/min (ref >60)
Potassium: 4.3 mEq/L (ref 3.5–5.1)
Total Bilirubin: 0.3 mg/dL (ref 0.3–1.2)

## 2011-07-23 LAB — CBC
HCT: 43.3 % (ref 36.0–46.0)
MCH: 29.4 pg (ref 26.0–34.0)
MCHC: 33.3 g/dL (ref 30.0–36.0)
MCV: 88.4 fL (ref 78.0–100.0)
Platelets: 197 10*3/uL (ref 150–400)
RDW: 14.2 % (ref 11.5–15.5)

## 2011-07-23 LAB — URINALYSIS, ROUTINE W REFLEX MICROSCOPIC
Bilirubin Urine: NEGATIVE
Hgb urine dipstick: NEGATIVE
Ketones, ur: NEGATIVE mg/dL
Nitrite: NEGATIVE
Urobilinogen, UA: 0.2 mg/dL (ref 0.0–1.0)

## 2011-07-23 LAB — TYPE AND SCREEN
ABO/RH(D): O NEG
Antibody Screen: NEGATIVE

## 2011-07-23 LAB — DIFFERENTIAL
Eosinophils Absolute: 0.1 10*3/uL (ref 0.0–0.7)
Eosinophils Relative: 1 % (ref 0–5)
Lymphocytes Relative: 22 % (ref 12–46)
Lymphs Abs: 3.2 10*3/uL (ref 0.7–4.0)
Monocytes Absolute: 1.5 10*3/uL — ABNORMAL HIGH (ref 0.1–1.0)
Monocytes Relative: 11 % (ref 3–12)

## 2011-07-23 LAB — SURGICAL PCR SCREEN: Staphylococcus aureus: NEGATIVE

## 2011-07-23 LAB — ABO/RH: ABO/RH(D): O NEG

## 2011-07-24 ENCOUNTER — Inpatient Hospital Stay (HOSPITAL_COMMUNITY): Payer: Medicare Other

## 2011-07-24 ENCOUNTER — Inpatient Hospital Stay (HOSPITAL_COMMUNITY)
Admission: RE | Admit: 2011-07-24 | Discharge: 2011-07-26 | DRG: 491 | Disposition: A | Discharge status: Home / Self Care | Payer: Medicare Other | Source: Ambulatory Visit | Attending: Orthopedic Surgery | Admitting: Orthopedic Surgery

## 2011-07-24 ENCOUNTER — Other Ambulatory Visit: Payer: Self-pay | Admitting: Orthopedic Surgery

## 2011-07-24 DIAGNOSIS — Z79890 Hormone replacement therapy (postmenopausal): Secondary | ICD-10-CM

## 2011-07-24 DIAGNOSIS — Z79899 Other long term (current) drug therapy: Secondary | ICD-10-CM

## 2011-07-24 DIAGNOSIS — J4489 Other specified chronic obstructive pulmonary disease: Secondary | ICD-10-CM | POA: Diagnosis present

## 2011-07-24 DIAGNOSIS — M7130 Other bursal cyst, unspecified site: Secondary | ICD-10-CM | POA: Diagnosis present

## 2011-07-24 DIAGNOSIS — J449 Chronic obstructive pulmonary disease, unspecified: Secondary | ICD-10-CM | POA: Diagnosis present

## 2011-07-24 DIAGNOSIS — E039 Hypothyroidism, unspecified: Secondary | ICD-10-CM | POA: Diagnosis present

## 2011-07-24 DIAGNOSIS — M48061 Spinal stenosis, lumbar region without neurogenic claudication: Principal | ICD-10-CM | POA: Diagnosis present

## 2011-07-24 DIAGNOSIS — M069 Rheumatoid arthritis, unspecified: Secondary | ICD-10-CM | POA: Diagnosis present

## 2011-07-24 DIAGNOSIS — Z01812 Encounter for preprocedural laboratory examination: Secondary | ICD-10-CM

## 2011-07-24 DIAGNOSIS — Z91041 Radiographic dye allergy status: Secondary | ICD-10-CM

## 2011-07-24 DIAGNOSIS — IMO0002 Reserved for concepts with insufficient information to code with codable children: Secondary | ICD-10-CM

## 2011-07-25 NOTE — Op Note (Signed)
Sharon Mcdaniel, Sharon Mcdaniel                 ACCOUNT NO.:  [id number]  MEDICAL RECORD NO.:  [id number]  LOCATION:  1618                         FACILITY:  [facility]  PHYSICIAN:  Georges Lynch. Alexxis Mackert, M.D.DATE OF BIRTH:  September 15, 1936  DATE OF PROCEDURE:  07/24/2011 DATE OF DISCHARGE:                              OPERATIVE REPORT   SURGEON:  Georges Lynch. Darrelyn Hillock, M.D.  ASSISTANT:  Jene Every, M.D.  PREOPERATIVE DIAGNOSES: 1. Severe spinal stenosis, almost a complete block at L4-5. 2. She has a large synovial cyst at L4-5 on the right with right leg     pain only.  OPERATION: 1. Complete decompressive lumbar laminectomy at L4-5 for spinal     stenosis. 2. Excision of a large synovial cyst at L4-5 on the right.  PROCEDURE:  Under general anesthesia with the patient on the spinal frame, a routine orthopedic prep and draping of the back was carried out.  The patient had 1 gram of IV Ancef preop.  Note, the appropriate time-out was carried in the operating room before any procedure was done.  Also, I marked the right side of her back in the holding area. After sterile prep and draping, two needles were placed in the back for localization purposes.  X-ray was taken.  I then carried out an incision over L4-5 and extended it proximally and distally.  Bleeders identified and cauterized.  I stripped the muscle from the lamina and spinous processes bilaterally.  Following that, two clamps were placed on the spinous processes of L4-L5.  Another x-ray was taken.  I then removed the spinous process of L4, a portion of L3, and went down centrally and did a central decompressive lumbar laminectomy, the microscope was used to highlight that.  At this time, the dura was protected with a cottonoid and we completed the decompression by removing the ligamentum flavum.  She had a large synovial cyst that was adherent to the right side of the dura.  We gently teased that off the dura without any side effects.  The  dura now was completely free.  We removed the synovial cyst and sent it to the lab.  Also, we did foraminotomies to make sure the roots of L5 and L4 were open.  We stopped our decompression proximally and distally after we made sure that we had complete freedom of the hockey-stick to pass distally and proximally, so we did not have to go any further distally or proximally with a decompression.  I thoroughly irrigated out the area.  Another x-ray was taken with a hockey-stick and a Penfield #4 in place to further isolate the disk space.  There was no herniated disk.  After irrigating the wound out removing the fluid, I then loosely applied some thrombin-soaked Gelfoam and closed the wound layers in usual fashion.  I left a small distal and proximal deep part of the wound open for drainage purposes.  Subcu was closed with 0 Vicryl.  Skin with metal staples.  A sterile Neosporin dressing was applied.  Estimated blood loss was about 50 cc.  SPECIMEN:  Sent to the lab.  It was a synovial cyst on the right, was sent to pathology.  ______________________________ Georges Lynch Darrelyn Hillock, M.D.     RAG/MEDQ  D:  07/24/2011  T:  07/25/2011  Job:  [id number]  cc:   Massie Maroon, MD Fax: [phone]  Electronically Signed by Ranee Gosselin M.D. on 07/25/2011 09:13:10 AM

## 2011-07-27 LAB — I-STAT 8, (EC8 V) (CONVERTED LAB)
Chloride: 104
HCT: 44
Hemoglobin: 15
Potassium: 3.6
Sodium: 134 — ABNORMAL LOW
TCO2: 26
pH, Ven: 7.412 — ABNORMAL HIGH

## 2011-07-27 LAB — DIFFERENTIAL
Basophils Absolute: 0.1
Basophils Relative: 1
Lymphocytes Relative: 17
Monocytes Absolute: 0.8
Neutro Abs: 8.3 — ABNORMAL HIGH
Neutrophils Relative %: 75

## 2011-07-27 LAB — POCT I-STAT CREATININE
Creatinine, Ser: 0.8
Operator id: 196461

## 2011-07-27 LAB — CBC
MCHC: 34.2
Platelets: 164
RDW: 13.5

## 2011-08-03 NOTE — H&P (Signed)
  NAME:  Sharon Mcdaniel, Sharon Mcdaniel                 ACCOUNT NO.:  [id number]  MEDICAL RECORD NO.:  [id number]  LOCATION:                               FACILITY:  [facility]  PHYSICIAN:  Georges Lynch. Aireal Slater, M.D.DATE OF BIRTH:  14-Oct-1936  DATE OF ADMISSION:  07/24/2011 DATE OF DISCHARGE:                             HISTORY & PHYSICAL   CHIEF COMPLAINT:  Right-sided radiculopathy with lumbar pain.  HISTORY OF PRESENT ILLNESS:  The patient is a 75 year old female with right-sided lumbar pain with radiculopathy down to the right foot.  The patient has known lumbar stenosis and was elected to have a lumbar laminectomy by Dr. Ranee Gosselin to decrease pain and increase function.  PAST MEDICAL HISTORY: 1. Hypothyroidism. 2. COPD.  FAMILY HISTORY:  Congestive heart failure.  SOCIAL HISTORY:  Patient of Dr. Selena Batten.  Does not smoke or use alcohol.  ALLERGIES: 1. TEQUIN. 2. AVELOX. 3. CIPRO. 4. LEVAQUIN. 5. FLUOROQUINOLONE. 6. CONTRAST CT DYE.  MEDICATIONS: 1. Synthroid 112 mcg daily. 2. Albuterol inhaler p.r.n. 3. Vivelle 0.37 mg daily. 4. Oxycodone 5/325 one to two tablets q.4-6 hours p.r.n. pain. 5. Budesonide 0.25 mg daily.  PHYSICAL EXAMINATION:  VITAL SIGNS:  Pulse 70, respiration 14, blood pressure 130/80. GENERAL:  The patient is a healthy-appearing, 75 year old female, in no acute distress.  Pleasant and affect.  Alert and oriented x3. HEAD AND NECK:  She has full range of motion without difficulty. NEURO:  Cranial nerves II through XII grossly intact. CHEST:  Active breath sounds bilaterally.  No wheezes, rhonchi, or rales. HEART:  Regular rate and rhythm.  No murmur. ABDOMEN:  Nontender, nondistended.  Active bowel sounds. EXTREMITIES:  She had moderate pain at the right-sided paraspinal muscles and also some mild pain into the right foot, but her sensations intact distally.  Neurovascularly she is intact distally.  She does have a normal heel-toe gait.  X-rays and MRI showed lumbar  stenosis.  IMPRESSION:  Lumbar stenosis with right-sided radiculopathy.  PLAN OF ACTION:  Lumbar laminectomy with Dr. Ranee Gosselin.     Thomas B. Dixon, P.A.   ______________________________ Georges Lynch Darrelyn Hillock, M.D.    TBD/MEDQ  D:  07/22/2011  T:  07/22/2011  Job:  [id number]  Electronically Signed by Standley Dakins P.A. on 07/27/2011 10:04:36 AM Electronically Signed by Ranee Gosselin M.D. on 08/03/2011 08:12:30 PM

## 2011-08-15 NOTE — Discharge Summary (Signed)
  Sharon Mcdaniel, Sharon Mcdaniel                 ACCOUNT NO.:  [id number]  MEDICAL RECORD NO.:  [id number]  LOCATION:  1618                         FACILITY:  [facility]  PHYSICIAN:  Georges Lynch. Summerlynn Glauser, M.D.DATE OF BIRTH:  [birth year]  DATE OF ADMISSION:  07/24/2011 DATE OF DISCHARGE:  07/26/2011                              DISCHARGE SUMMARY   She was taken to surgery by me on July 24, 2011.  At that time, I did excision of a large synovial cyst at L4-5 on the right.  Postop, her leg pain was markedly improved.  We did a complete decompressive lumbar laminectomy at L4-5 also for spinal stenosis as far as the progress. The following day, she was improved.  She was allowed up to ambulate. On July 25, 2011, she was evaluated by French Ana A. Shuford, P.A., who saw the patient and Foley was discontinued.  The plan was to have her discharge the following day on July 26, 2011.  The following day, she was discharged to be followed in our office.  The patient was recovered.  She was up ambulating, had no specific problem.  The pathology report showed synovial cyst.  It was benign.  She had multiple x-rays done in the operating room to localize the position at the time of surgery.  The pertinent lab studies are really not on the chart, then I confined at this point.  DISCHARGE CONDITION:  Improved.  DISCHARGE DIET:  Remain on the preop diet.  DISCHARGE INSTRUCTIONS:  See me in the office 2 weeks or prior to if there is a problem for suture removal.  The discharge medications were all listed in the discharge manager.  I am sorry, I cannot give you anything else on that.  I cannot find the discharge labs of all the labs on discharge.  The only thing that is on the dictation is the pathology report, otherwise on labs, there is no other labs as far as CBC etc.          ______________________________ Georges Lynch. Darrelyn Hillock, M.D.     RAG/MEDQ  D:  08/11/2011  T:  08/11/2011  Job:   [id number]  Electronically Signed by Ranee Gosselin M.D. on 08/15/2011 10:03:19 AM

## 2011-08-30 ENCOUNTER — Emergency Department (HOSPITAL_COMMUNITY): Payer: Medicare Other

## 2011-08-30 ENCOUNTER — Emergency Department (HOSPITAL_COMMUNITY)
Admission: EM | Admit: 2011-08-30 | Discharge: 2011-08-30 | Disposition: A | Discharge status: Home / Self Care | Payer: Medicare Other | Attending: Emergency Medicine | Admitting: Emergency Medicine

## 2011-08-30 DIAGNOSIS — J45909 Unspecified asthma, uncomplicated: Secondary | ICD-10-CM | POA: Insufficient documentation

## 2011-08-30 DIAGNOSIS — K219 Gastro-esophageal reflux disease without esophagitis: Secondary | ICD-10-CM | POA: Insufficient documentation

## 2011-08-30 DIAGNOSIS — R197 Diarrhea, unspecified: Secondary | ICD-10-CM | POA: Insufficient documentation

## 2011-08-30 DIAGNOSIS — R112 Nausea with vomiting, unspecified: Secondary | ICD-10-CM | POA: Insufficient documentation

## 2011-08-30 DIAGNOSIS — R1013 Epigastric pain: Secondary | ICD-10-CM | POA: Insufficient documentation

## 2011-08-30 DIAGNOSIS — Z79899 Other long term (current) drug therapy: Secondary | ICD-10-CM | POA: Insufficient documentation

## 2011-08-30 LAB — COMPREHENSIVE METABOLIC PANEL
AST: 22 U/L (ref 0–37)
Albumin: 3.2 g/dL — ABNORMAL LOW (ref 3.5–5.2)
Calcium: 9.4 mg/dL (ref 8.4–10.5)
Chloride: 93 mEq/L — ABNORMAL LOW (ref 96–112)
Creatinine, Ser: 0.58 mg/dL (ref 0.50–1.10)
Sodium: 127 mEq/L — ABNORMAL LOW (ref 135–145)

## 2011-08-30 LAB — URINALYSIS, ROUTINE W REFLEX MICROSCOPIC
Ketones, ur: NEGATIVE mg/dL
Leukocytes, UA: NEGATIVE
Nitrite: NEGATIVE
Protein, ur: NEGATIVE mg/dL
pH: 8 (ref 5.0–8.0)

## 2011-08-30 LAB — DIFFERENTIAL
Basophils Absolute: 0 10*3/uL (ref 0.0–0.1)
Eosinophils Relative: 1 % (ref 0–5)
Lymphocytes Relative: 20 % (ref 12–46)
Lymphs Abs: 1 10*3/uL (ref 0.7–4.0)
Neutro Abs: 3.2 10*3/uL (ref 1.7–7.7)
Neutrophils Relative %: 62 % (ref 43–77)

## 2011-08-30 LAB — CBC
HCT: 37.6 % (ref 36.0–46.0)
RBC: 4.29 MIL/uL (ref 3.87–5.11)
RDW: 13.9 % (ref 11.5–15.5)
WBC: 5.2 10*3/uL (ref 4.0–10.5)

## 2011-08-30 LAB — POCT I-STAT TROPONIN I

## 2011-08-31 LAB — URINE CULTURE

## 2011-10-20 ENCOUNTER — Other Ambulatory Visit: Payer: Self-pay | Admitting: Gynecology

## 2011-10-20 DIAGNOSIS — Z1231 Encounter for screening mammogram for malignant neoplasm of breast: Secondary | ICD-10-CM

## 2011-11-03 ENCOUNTER — Other Ambulatory Visit: Payer: Self-pay

## 2011-11-03 ENCOUNTER — Emergency Department (HOSPITAL_COMMUNITY): Payer: Medicare Other

## 2011-11-03 ENCOUNTER — Inpatient Hospital Stay (HOSPITAL_COMMUNITY)
Admission: EM | Admit: 2011-11-03 | Discharge: 2011-11-06 | DRG: 194 | Disposition: A | Discharge status: Home / Self Care | Payer: Medicare Other | Attending: Internal Medicine | Admitting: Internal Medicine

## 2011-11-03 DIAGNOSIS — J40 Bronchitis, not specified as acute or chronic: Secondary | ICD-10-CM

## 2011-11-03 DIAGNOSIS — J438 Other emphysema: Secondary | ICD-10-CM

## 2011-11-03 DIAGNOSIS — J984 Other disorders of lung: Secondary | ICD-10-CM | POA: Diagnosis not present

## 2011-11-03 DIAGNOSIS — J209 Acute bronchitis, unspecified: Secondary | ICD-10-CM

## 2011-11-03 DIAGNOSIS — R05 Cough: Secondary | ICD-10-CM | POA: Diagnosis not present

## 2011-11-03 DIAGNOSIS — J09X2 Influenza due to identified novel influenza A virus with other respiratory manifestations: Secondary | ICD-10-CM | POA: Diagnosis present

## 2011-11-03 DIAGNOSIS — M26629 Arthralgia of temporomandibular joint, unspecified side: Secondary | ICD-10-CM

## 2011-11-03 DIAGNOSIS — E236 Other disorders of pituitary gland: Secondary | ICD-10-CM | POA: Diagnosis present

## 2011-11-03 DIAGNOSIS — R059 Cough, unspecified: Secondary | ICD-10-CM | POA: Diagnosis not present

## 2011-11-03 DIAGNOSIS — J441 Chronic obstructive pulmonary disease with (acute) exacerbation: Secondary | ICD-10-CM | POA: Diagnosis present

## 2011-11-03 DIAGNOSIS — E871 Hypo-osmolality and hyponatremia: Secondary | ICD-10-CM | POA: Diagnosis not present

## 2011-11-03 DIAGNOSIS — E039 Hypothyroidism, unspecified: Secondary | ICD-10-CM | POA: Diagnosis not present

## 2011-11-03 DIAGNOSIS — R0602 Shortness of breath: Secondary | ICD-10-CM | POA: Diagnosis not present

## 2011-11-03 DIAGNOSIS — M2669 Other specified disorders of temporomandibular joint: Secondary | ICD-10-CM | POA: Diagnosis not present

## 2011-11-03 DIAGNOSIS — J449 Chronic obstructive pulmonary disease, unspecified: Secondary | ICD-10-CM

## 2011-11-03 HISTORY — DX: Chronic obstructive pulmonary disease, unspecified: J44.9

## 2011-11-03 LAB — URINALYSIS, ROUTINE W REFLEX MICROSCOPIC
Glucose, UA: NEGATIVE mg/dL
Ketones, ur: NEGATIVE mg/dL
Leukocytes, UA: NEGATIVE
Nitrite: NEGATIVE
Protein, ur: NEGATIVE mg/dL
Urobilinogen, UA: 0.2 mg/dL (ref 0.0–1.0)

## 2011-11-03 LAB — DIFFERENTIAL
Basophils Relative: 0 % (ref 0–1)
Lymphs Abs: 0.9 10*3/uL (ref 0.7–4.0)
Monocytes Absolute: 1.4 10*3/uL — ABNORMAL HIGH (ref 0.1–1.0)
Monocytes Relative: 10 % (ref 3–12)
Neutro Abs: 11.8 10*3/uL — ABNORMAL HIGH (ref 1.7–7.7)

## 2011-11-03 LAB — COMPREHENSIVE METABOLIC PANEL
Albumin: 3.3 g/dL — ABNORMAL LOW (ref 3.5–5.2)
BUN: 15 mg/dL (ref 6–23)
Chloride: 92 mEq/L — ABNORMAL LOW (ref 96–112)
Creatinine, Ser: 0.61 mg/dL (ref 0.50–1.10)
GFR calc Af Amer: >90 mL/min (ref >90)
GFR calc non Af Amer: 88 mL/min — ABNORMAL LOW (ref >90)
Glucose, Bld: 112 mg/dL — ABNORMAL HIGH (ref 70–99)
Total Bilirubin: 0.2 mg/dL — ABNORMAL LOW (ref 0.3–1.2)

## 2011-11-03 LAB — CBC
HCT: 40.3 % (ref 36.0–46.0)
Hemoglobin: 13.8 g/dL (ref 12.0–15.0)
MCH: 29.7 pg (ref 26.0–34.0)
MCHC: 34.2 g/dL (ref 30.0–36.0)

## 2011-11-03 LAB — INFLUENZA PANEL BY PCR (TYPE A & B): Influenza A By PCR: POSITIVE — AB

## 2011-11-03 MED ORDER — IPRATROPIUM BROMIDE 0.02 % IN SOLN
0.5000 mg | Freq: Once | RESPIRATORY_TRACT | Status: DC
  Filled 2011-11-03: qty 2.5

## 2011-11-03 MED ORDER — POLYETHYL GLYCOL-PROPYL GLYCOL 0.4-0.3 % OP SOLN: 1.0000 [drp] | OPHTHALMIC | Status: DC | PRN

## 2011-11-03 MED ORDER — DEXTROSE 5 % IV SOLN
500.0000 mg | Freq: Once | INTRAVENOUS | Status: AC
  Administered 2011-11-03: 500 mg via INTRAVENOUS
  Filled 2011-11-03: qty 500

## 2011-11-03 MED ORDER — DEXTROSE 5 % IV SOLN
1.0000 g | Freq: Once | INTRAVENOUS | Status: AC
  Administered 2011-11-03: 1 g via INTRAVENOUS
  Filled 2011-11-03: qty 10

## 2011-11-03 MED ORDER — ALBUTEROL SULFATE (5 MG/ML) 0.5% IN NEBU
2.5000 mg | INHALATION_SOLUTION | Freq: Two times a day (BID) | RESPIRATORY_TRACT | Status: DC
  Administered 2011-11-03 – 2011-11-04 (×2): 2.5 mg via RESPIRATORY_TRACT
  Filled 2011-11-03 (×2): qty 0.5

## 2011-11-03 MED ORDER — ACETAMINOPHEN 650 MG RE SUPP: 650.0000 mg | Freq: Four times a day (QID) | RECTAL | Status: DC | PRN

## 2011-11-03 MED ORDER — ESTRADIOL 0.05 MG/24HR TD PTWK
0.0500 mg | MEDICATED_PATCH | TRANSDERMAL | Status: DC
  Administered 2011-11-03: 0.05 mg via TRANSDERMAL
  Filled 2011-11-03 (×2): qty 1

## 2011-11-03 MED ORDER — IPRATROPIUM-ALBUTEROL 0.5-2.5 (3) MG/3ML IN SOLN: 3.0000 mL | Freq: Two times a day (BID) | RESPIRATORY_TRACT | Status: DC

## 2011-11-03 MED ORDER — IPRATROPIUM BROMIDE 0.02 % IN SOLN
0.5000 mg | Freq: Two times a day (BID) | RESPIRATORY_TRACT | Status: DC
  Administered 2011-11-03 – 2011-11-04 (×2): 0.5 mg via RESPIRATORY_TRACT
  Filled 2011-11-03 (×2): qty 2.5

## 2011-11-03 MED ORDER — ALBUTEROL SULFATE (5 MG/ML) 0.5% IN NEBU
5.0000 mg | INHALATION_SOLUTION | Freq: Once | RESPIRATORY_TRACT | Status: AC
  Administered 2011-11-03: 5 mg via RESPIRATORY_TRACT
  Filled 2011-11-03: qty 1

## 2011-11-03 MED ORDER — SODIUM CHLORIDE 0.9 % IV BOLUS (SEPSIS)
500.0000 mL | Freq: Once | INTRAVENOUS | Status: AC
  Administered 2011-11-03: 500 mL via INTRAVENOUS

## 2011-11-03 MED ORDER — LEVOTHYROXINE SODIUM 112 MCG PO TABS
112.0000 ug | ORAL_TABLET | Freq: Every day | ORAL | Status: DC
  Administered 2011-11-04 – 2011-11-06 (×3): 112 ug via ORAL
  Filled 2011-11-03 (×5): qty 1

## 2011-11-03 MED ORDER — AZITHROMYCIN 250 MG PO TABS
250.0000 mg | ORAL_TABLET | Freq: Every day | ORAL | Status: DC
  Administered 2011-11-04 – 2011-11-05 (×2): 250 mg via ORAL
  Filled 2011-11-03 (×2): qty 1

## 2011-11-03 MED ORDER — ALBUTEROL SULFATE (5 MG/ML) 0.5% IN NEBU
5.0000 mg | INHALATION_SOLUTION | Freq: Once | RESPIRATORY_TRACT | Status: AC
  Administered 2011-11-03: 5 mg via RESPIRATORY_TRACT
  Filled 2011-11-03: qty 0.5

## 2011-11-03 MED ORDER — OSELTAMIVIR PHOSPHATE 75 MG PO CAPS
75.0000 mg | ORAL_CAPSULE | Freq: Two times a day (BID) | ORAL | Status: DC
  Administered 2011-11-03 – 2011-11-06 (×6): 75 mg via ORAL
  Filled 2011-11-03 (×7): qty 1

## 2011-11-03 MED ORDER — PREDNISONE 20 MG PO TABS
30.0000 mg | ORAL_TABLET | Freq: Every day | ORAL | Status: DC
  Administered 2011-11-03 – 2011-11-06 (×4): 30 mg via ORAL
  Filled 2011-11-03 (×4): qty 1

## 2011-11-03 MED ORDER — ACETAMINOPHEN 325 MG PO TABS: 650.0000 mg | ORAL_TABLET | Freq: Four times a day (QID) | ORAL | Status: DC | PRN

## 2011-11-03 MED ORDER — POLYVINYL ALCOHOL 1.4 % OP SOLN
1.0000 [drp] | OPHTHALMIC | Status: DC | PRN
  Filled 2011-11-03: qty 15

## 2011-11-03 MED ORDER — SODIUM CHLORIDE 0.9 % IJ SOLN: 3.0000 mL | INTRAMUSCULAR | Status: DC | PRN

## 2011-11-03 MED ORDER — ENOXAPARIN SODIUM 40 MG/0.4ML ~~LOC~~ SOLN
40.0000 mg | SUBCUTANEOUS | Status: DC
  Administered 2011-11-03 – 2011-11-05 (×3): 40 mg via SUBCUTANEOUS
  Filled 2011-11-03 (×5): qty 0.4

## 2011-11-03 MED ORDER — SODIUM CHLORIDE 0.9 % IJ SOLN
3.0000 mL | Freq: Two times a day (BID) | INTRAMUSCULAR | Status: DC
  Administered 2011-11-03 – 2011-11-06 (×7): 3 mL via INTRAVENOUS

## 2011-11-03 MED ORDER — DEXTROSE 5 % IV SOLN
1.0000 g | INTRAVENOUS | Status: DC
  Administered 2011-11-04: 1 g via INTRAVENOUS
  Filled 2011-11-03 (×2): qty 10

## 2011-11-03 MED ORDER — SODIUM CHLORIDE 0.9 % IV SOLN: 250.0000 mL | INTRAVENOUS | Status: DC | PRN

## 2011-11-03 MED ORDER — ALBUTEROL SULFATE (5 MG/ML) 0.5% IN NEBU
2.5000 mg | INHALATION_SOLUTION | RESPIRATORY_TRACT | Status: DC | PRN
  Administered 2011-11-04: 2.5 mg via RESPIRATORY_TRACT

## 2011-11-03 NOTE — ED Notes (Signed)
Patient transported to X-ray 

## 2011-11-03 NOTE — ED Notes (Signed)
Report received from [facility].  Pt. Moved to bed 26 in TCU.  Alert and oriented.  No resp. Distress.  Lungs clear.  Family at bedside.

## 2011-11-03 NOTE — ED Provider Notes (Signed)
History     CSN: [id number]  Arrival date & time 11/03/11  1121   First MD Initiated Contact with Patient 11/03/11 1138   12:28 PM HPI Patient reports upper respiratory tract infection for 3 days. States it has gradually been worsening. Reports last night became severely short of breath. Reports shortness of breath is now constant. Symptoms associated with nasal congestion. Denies chest pain, fever or, sore throat, back pain. Patient has a significant history of COPD and pneumonia, denies CHF. Reports she has several admissions for pneumonia in the past. States Dr. Wyn Quaker started her on a protocol should she have  upper respiratory tract infections again. Patient states as soon as coughing began she started using her albuterol and taking prednisone. States this has not helped. Patient is a 76 y.o. female presenting with cough. The history is provided by the patient.  Cough This is a new problem. Episode onset: 3 days ago. The problem occurs every few minutes. The problem has been rapidly worsening. The cough is productive of sputum. There has been no fever. Associated symptoms include rhinorrhea, sore throat, shortness of breath and wheezing. Pertinent negatives include no chest pain, no chills, no ear congestion, no ear pain, no headaches and no myalgias. Treatments tried: Prednisone and albuterol inhaler. The treatment provided mild relief. Her past medical history is significant for COPD and asthma.    Past Medical History  Diagnosis Date  . COPD (chronic obstructive pulmonary disease)     Past Surgical History  Procedure Date  . Back surgery 07/2011    synovial cyst removal    No family history on file.  History  Substance Use Topics  . Smoking status: Former Smoker    Quit date: 11/03/2003  . Smokeless tobacco: Not on file  . Alcohol Use: No    OB History    Grav Para Term Preterm Abortions TAB SAB Ect Mult Living                  Review of Systems  Constitutional:  Positive for fatigue. Negative for fever and chills.  HENT: Positive for congestion, sore throat and rhinorrhea. Negative for ear pain, neck pain, neck stiffness and sinus pressure.   Respiratory: Positive for cough, shortness of breath and wheezing.   Cardiovascular: Negative for chest pain.  Gastrointestinal: Negative for nausea, vomiting and abdominal pain.  Genitourinary: Negative for dysuria, urgency and frequency.  Musculoskeletal: Negative for myalgias and back pain.  Neurological: Negative for dizziness, numbness and headaches.  All other systems reviewed and are negative.    Allergies  Diflucan; Iodinated diagnostic agents; Quinolones; Tequin; and Codeine  Home Medications   Current Outpatient Rx  Name Route Sig Dispense Refill  . ESTRADIOL 0.0375 MG/24HR TD PTTW Transdermal Place 1 patch onto the skin 2 (two) times a week. Wednesday & Sundays    . IPRATROPIUM-ALBUTEROL 0.5-2.5 (3) MG/3ML IN SOLN Nebulization Take 3 mLs by nebulization 2 (two) times daily.      Marland Kitchen LEVOTHYROXINE SODIUM 112 MCG PO TABS Oral Take 112 mcg by mouth daily.     Frazier Butt OP Ophthalmic Apply 1 drop to eye as needed. For dry eyes     . PREDNISONE 10 MG PO TABS Oral Take 20 mg by mouth as needed. For shortness of breath       BP 144/60  Pulse 90  Temp(Src) 97.2 F (36.2 C) (Oral)  Resp 24  Ht 5\' 2"  (1.575 m)  Wt 147 lb (66.679 kg)  BMI  26.89 kg/m2  SpO2 92%  Physical Exam  Vitals reviewed. Constitutional: She is oriented to person, place, and time. Vital signs are normal. She appears well-developed and well-nourished. She appears ill.  HENT:  Head: Normocephalic and atraumatic.  Nose: Nose normal.  Mouth/Throat: Uvula is midline, oropharynx is clear and moist and mucous membranes are normal.  Eyes: Conjunctivae are normal. Pupils are equal, round, and reactive to light.  Neck: Normal range of motion. Neck supple.  Cardiovascular: Normal rate, regular rhythm and normal heart sounds.  Exam  reveals no friction rub.   No murmur heard. Pulmonary/Chest: Tachypnea noted. She has wheezes (diffuse). She has rhonchi (diffuse). She has no rales. She exhibits no tenderness.  Musculoskeletal: Normal range of motion.  Neurological: She is alert and oriented to person, place, and time. Coordination normal.  Skin: Skin is warm and dry. No rash noted. No erythema. No pallor.    ED Course  Procedures   Results for orders placed during the hospital encounter of 11/03/11  CBC      Component Value Range   WBC 14.2 (*) 4.0 - 10.5 (K/uL)   RBC 4.64  3.87 - 5.11 (MIL/uL)   Hemoglobin 13.8  12.0 - 15.0 (g/dL)   HCT 16.1  09.6 - 04.5 (%)   MCV 86.9  78.0 - 100.0 (fL)   MCH 29.7  26.0 - 34.0 (pg)   MCHC 34.2  30.0 - 36.0 (g/dL)   RDW 40.9  81.1 - 91.4 (%)   Platelets 176  150 - 400 (K/uL)  DIFFERENTIAL      Component Value Range   Neutrophils Relative 84 (*) 43 - 77 (%)   Neutro Abs 11.8 (*) 1.7 - 7.7 (K/uL)   Lymphocytes Relative 6 (*) 12 - 46 (%)   Lymphs Abs 0.9  0.7 - 4.0 (K/uL)   Monocytes Relative 10  3 - 12 (%)   Monocytes Absolute 1.4 (*) 0.1 - 1.0 (K/uL)   Eosinophils Relative 0  0 - 5 (%)   Eosinophils Absolute 0.0  0.0 - 0.7 (K/uL)   Basophils Relative 0  0 - 1 (%)   Basophils Absolute 0.0  0.0 - 0.1 (K/uL)  COMPREHENSIVE METABOLIC PANEL      Component Value Range   Sodium 130 (*) 135 - 145 (mEq/L)   Potassium 3.9  3.5 - 5.1 (mEq/L)   Chloride 92 (*) 96 - 112 (mEq/L)   CO2 29  19 - 32 (mEq/L)   Glucose, Bld 112 (*) 70 - 99 (mg/dL)   BUN 15  6 - 23 (mg/dL)   Creatinine, Ser 7.82  0.50 - 1.10 (mg/dL)   Calcium 9.2  8.4 - 95.6 (mg/dL)   Total Protein 6.7  6.0 - 8.3 (g/dL)   Albumin 3.3 (*) 3.5 - 5.2 (g/dL)   AST 25  0 - 37 (U/L)   ALT 24  0 - 35 (U/L)   Alkaline Phosphatase 88  39 - 117 (U/L)   Total Bilirubin 0.2 (*) 0.3 - 1.2 (mg/dL)   GFR calc non Af Amer 88 (*) >90 (mL/min)   GFR calc Af Amer >90  >90 (mL/min)  URINALYSIS, ROUTINE W REFLEX MICROSCOPIC       Component Value Range   Color, Urine YELLOW  YELLOW    APPearance CLOUDY (*) CLEAR    Specific Gravity, Urine 1.021  1.005 - 1.030    pH 6.0  5.0 - 8.0    Glucose, UA NEGATIVE  NEGATIVE (mg/dL)   Hgb urine  dipstick NEGATIVE  NEGATIVE    Bilirubin Urine NEGATIVE  NEGATIVE    Ketones, ur NEGATIVE  NEGATIVE (mg/dL)   Protein, ur NEGATIVE  NEGATIVE (mg/dL)   Urobilinogen, UA 0.2  0.0 - 1.0 (mg/dL)   Nitrite NEGATIVE  NEGATIVE    Leukocytes, UA NEGATIVE  NEGATIVE    Dg Chest 2 View  11/03/2011  *RADIOLOGY REPORT*  Clinical Data: Cough  CHEST - 2 VIEW  Comparison: 07/16/2011  Findings: Cardiomediastinal silhouette is stable.  Stable probable chronic mild interstitial prominence.  No focal infiltrate or pulmonary edema.  Stable osteopenia and mild degenerative changes thoracic spine. Again noted bilateral apical scarring.  IMPRESSION: No active disease.  Stable probable chronic mild interstitial prominence.  Original Report Authenticated By: Natasha Mead, M.D.   ED ECG REPORT   Date: 11/03/2011  EKG Time: 2:40 PM  Rate: 85  Rhythm: normal sinus rhythm,  unchanged from previous tracings  Axis: nml  Intervals:none  ST&T Change: Nonspecific [place] changes  Narrative Interpretation: no cignificant changes since 08/30/2011           MDM    Spoke with Dr. Orson Slick, [facility] hospitalist. Will come see the patient for admission. It admit to team 6. Discussed plan with family and patient and they agree. Reports shortness of breath and wheezing has not improved after one dose of albuterol will order another dose as well as Rocephin and days azithromycin. Dr. Orson Slick requested a flu PCR     Thomasene Lot, [place] 11/03/11 1411  Thomasene Lot, [place] 11/03/11 1441

## 2011-11-03 NOTE — ED Notes (Signed)
Vital signs stable. 

## 2011-11-03 NOTE — ED Notes (Addendum)
Family at bedside.  Placed on cardiac monitor and pulse ox.  Placed on 2L/[place] of o2 for RA sats 91-91%.

## 2011-11-03 NOTE — ED Notes (Signed)
Patient denies pain and is resting comfortably.  

## 2011-11-03 NOTE — ED Notes (Signed)
MD at bedside. 

## 2011-11-03 NOTE — ED Notes (Signed)
Family at bedside. 

## 2011-11-03 NOTE — ED Notes (Signed)
Patient is resting comfortably. 

## 2011-11-03 NOTE — ED Provider Notes (Signed)
Medical screening examination/treatment/procedure(s) were conducted as a shared visit with non-physician practitioner(s) and myself.  I personally evaluated the patient during the encounter  Pt seen and examined.  Bilateral expiratory wheezing.  Some improvement after neb treatment, but this was only transient.  Pt admitted to [facility] for further management  Ethelda Chick, MD 11/03/11 1515

## 2011-11-04 DIAGNOSIS — J438 Other emphysema: Secondary | ICD-10-CM | POA: Diagnosis not present

## 2011-11-04 DIAGNOSIS — E871 Hypo-osmolality and hyponatremia: Secondary | ICD-10-CM | POA: Diagnosis not present

## 2011-11-04 LAB — BASIC METABOLIC PANEL
CO2: 28 mEq/L (ref 19–32)
Chloride: 94 mEq/L — ABNORMAL LOW (ref 96–112)
Creatinine, Ser: 0.52 mg/dL (ref 0.50–1.10)

## 2011-11-04 LAB — CBC
HCT: 39 % (ref 36.0–46.0)
MCV: 86.9 fL (ref 78.0–100.0)
RBC: 4.49 MIL/uL (ref 3.87–5.11)
RDW: 13.3 % (ref 11.5–15.5)
WBC: 12.3 10*3/uL — ABNORMAL HIGH (ref 4.0–10.5)

## 2011-11-04 MED ORDER — ALBUTEROL SULFATE (5 MG/ML) 0.5% IN NEBU
2.5000 mg | INHALATION_SOLUTION | Freq: Four times a day (QID) | RESPIRATORY_TRACT | Status: DC
  Administered 2011-11-04 – 2011-11-06 (×8): 2.5 mg via RESPIRATORY_TRACT
  Filled 2011-11-04 (×9): qty 0.5

## 2011-11-04 MED ORDER — IPRATROPIUM BROMIDE 0.02 % IN SOLN
0.5000 mg | Freq: Four times a day (QID) | RESPIRATORY_TRACT | Status: DC
  Administered 2011-11-04 – 2011-11-06 (×8): 0.5 mg via RESPIRATORY_TRACT
  Filled 2011-11-04 (×9): qty 2.5

## 2011-11-04 NOTE — H&P (Signed)
Sharon Mcdaniel is an 76 y.o. female.   Chief Complaint: Shortness of breath and cough HPI: Sharon Mcdaniel is a very pleasant 76 yo woman with a history of COPD and hypothyroidism who presents to the ED with several days of worsening cough and shortness of breath.  About three days ago she developed symptoms consistent with an upper respiratory infection but the day before admission, she became significantly more short of breath. She had some prednisone at home which she took (10 or 20 mg) but did not get any relief, nor did her nebs help.  Until this recent URI, her COPD symptoms had been pretty stable.  She is not on oxygen at home. She did get her flu shot this year. She denies recent sick contacts.  Past Medical History  Diagnosis Date  . COPD (chronic obstructive pulmonary disease)     Past Surgical History  Procedure Date  . Back surgery 07/2011    synovial cyst removal    History reviewed. No pertinent family history. Social History:  reports that she quit smoking about 8 years ago. She has never used smokeless tobacco. She reports that she does not drink alcohol or use illicit drugs.  Allergies:  Allergies  Allergen Reactions  . Diflucan Hives and Shortness Of Breath  . Iodinated Diagnostic Agents Hives and Shortness Of Breath  . Quinolones Hives and Shortness Of Breath  . Tequin Hives and Shortness Of Breath  . Codeine Other (See Comments)    Makes her jittery     Medications Prior to Admission  Medication Dose Route Frequency Provider Last Rate Last Dose  . 0.9 %  sodium chloride infusion  250 mL Intravenous PRN Tana Felts      . acetaminophen (TYLENOL) tablet 650 mg  650 mg Oral Q6H PRN Mystic Labo       Or  . acetaminophen (TYLENOL) suppository 650 mg  650 mg Rectal Q6H PRN Tana Felts      . albuterol (PROVENTIL) (5 MG/ML) 0.5% nebulizer solution 2.5 mg  2.5 mg Nebulization Q2H PRN Darius Lundberg   2.5 mg at 11/04/11 1113  . albuterol (PROVENTIL) (5 MG/ML) 0.5%  nebulizer solution 2.5 mg  2.5 mg Nebulization Q6H Novlet Jarrett-Davis      . albuterol (PROVENTIL) (5 MG/ML) 0.5% nebulizer solution 5 mg  5 mg Nebulization Once Thomasene Lot, PA   5 mg at 11/03/11 1250  . albuterol (PROVENTIL) (5 MG/ML) 0.5% nebulizer solution 5 mg  5 mg Nebulization Once Ethelda Chick, MD   5 mg at 11/03/11 1713  . azithromycin (ZITHROMAX) 500 mg in dextrose 5 % 250 mL IVPB  500 mg Intravenous Once Thomasene Lot, PA   500 mg at 11/03/11 1621  . azithromycin (ZITHROMAX) tablet 250 mg  250 mg Oral Daily Sharon Mcdaniel   250 mg at 11/04/11 0924  . cefTRIAXone (ROCEPHIN) 1 g in dextrose 5 % 50 mL IVPB  1 g Intravenous Once Thomasene Lot, PA   1 g at 11/03/11 1515  . cefTRIAXone (ROCEPHIN) 1 g in dextrose 5 % 50 mL IVPB  1 g Intravenous Q24H Juliah Scadden   1 g at 11/04/11 1313  . enoxaparin (LOVENOX) injection 40 mg  40 mg Subcutaneous Q24H Rhiannan Kievit   40 mg at 11/03/11 2053  . estradiol (CLIMARA - Dosed in mg/24 hr) patch 0.05 mg  0.05 mg Transdermal Weekly Avayah Raffety   0.05 mg at 11/03/11 2053  . ipratropium (ATROVENT) nebulizer solution 0.5 mg  0.5  mg Nebulization Q6H Novlet Jarrett-Davis      . levothyroxine (SYNTHROID, LEVOTHROID) tablet 112 mcg  112 mcg Oral Daily Doyce Saling   112 mcg at 11/04/11 0924  . oseltamivir (TAMIFLU) capsule 75 mg  75 mg Oral BID Gery Pray, MD   75 mg at 11/04/11 0924  . polyvinyl alcohol (LIQUIFILM TEARS) 1.4 % ophthalmic solution 1 drop  1 drop Both Eyes PRN Tana Felts      . predniSONE (DELTASONE) tablet 30 mg  30 mg Oral Q breakfast Kesa Birky   30 mg at 11/04/11 0841  . sodium chloride 0.9 % bolus 500 mL  500 mL Intravenous Once Thomasene Lot, PA   500 mL at 11/03/11 1253  . sodium chloride 0.9 % injection 3 mL  3 mL Intravenous Q12H Coraline Talwar   3 mL at 11/04/11 0924  . sodium chloride 0.9 % injection 3 mL  3 mL Intravenous PRN Tana Felts      . DISCONTD: albuterol (PROVENTIL) (5 MG/ML) 0.5% nebulizer  solution 2.5 mg  2.5 mg Nebulization BID Sharlon Pfohl   2.5 mg at 11/04/11 0557  . DISCONTD: ipratropium (ATROVENT) nebulizer solution 0.5 mg  0.5 mg Nebulization Once Ethelda Chick, MD      . DISCONTD: ipratropium (ATROVENT) nebulizer solution 0.5 mg  0.5 mg Nebulization BID Darlene Brozowski   0.5 mg at 11/04/11 0556  . DISCONTD: ipratropium-albuterol (DUONEB) 0.5-2.5 (3) MG/3ML nebulizer solution 3 mL  3 mL Nebulization BID Tana Felts      . DISCONTD: Polyethyl Glycol-Propyl Glycol 0.4-0.3 % SOLN 1 drop  1 drop Ophthalmic PRN Tana Felts       No current outpatient prescriptions on file as of 11/04/2011.    Results for orders placed during the hospital encounter of 11/03/11 (from the past 48 hour(s))  URINALYSIS, ROUTINE W REFLEX MICROSCOPIC     Status: Abnormal   Collection Time   11/03/11 12:20 PM      Component Value Range Comment   Color, Urine YELLOW  YELLOW     APPearance CLOUDY (*) CLEAR     Specific Gravity, Urine 1.021  1.005 - 1.030     pH 6.0  5.0 - 8.0     Glucose, UA NEGATIVE  NEGATIVE (mg/dL)    Hgb urine dipstick NEGATIVE  NEGATIVE     Bilirubin Urine NEGATIVE  NEGATIVE     Ketones, ur NEGATIVE  NEGATIVE (mg/dL)    Protein, ur NEGATIVE  NEGATIVE (mg/dL)    Urobilinogen, UA 0.2  0.0 - 1.0 (mg/dL)    Nitrite NEGATIVE  NEGATIVE     Leukocytes, UA NEGATIVE  NEGATIVE  MICROSCOPIC NOT DONE ON URINES WITH NEGATIVE PROTEIN, BLOOD, LEUKOCYTES, NITRITE, OR GLUCOSE <1000 mg/dL.  CBC     Status: Abnormal   Collection Time   11/03/11  1:00 PM      Component Value Range Comment   WBC 14.2 (*) 4.0 - 10.5 (K/uL)    RBC 4.64  3.87 - 5.11 (MIL/uL)    Hemoglobin 13.8  12.0 - 15.0 (g/dL)    HCT 96.0  45.4 - 09.8 (%)    MCV 86.9  78.0 - 100.0 (fL)    MCH 29.7  26.0 - 34.0 (pg)    MCHC 34.2  30.0 - 36.0 (g/dL)    RDW 11.9  14.7 - 82.9 (%)    Platelets 176  150 - 400 (K/uL)   DIFFERENTIAL     Status: Abnormal   Collection Time  11/03/11  1:00 PM      Component Value Range  Comment   Neutrophils Relative 84 (*) 43 - 77 (%)    Neutro Abs 11.8 (*) 1.7 - 7.7 (K/uL)    Lymphocytes Relative 6 (*) 12 - 46 (%)    Lymphs Abs 0.9  0.7 - 4.0 (K/uL)    Monocytes Relative 10  3 - 12 (%)    Monocytes Absolute 1.4 (*) 0.1 - 1.0 (K/uL)    Eosinophils Relative 0  0 - 5 (%)    Eosinophils Absolute 0.0  0.0 - 0.7 (K/uL)    Basophils Relative 0  0 - 1 (%)    Basophils Absolute 0.0  0.0 - 0.1 (K/uL)   COMPREHENSIVE METABOLIC PANEL     Status: Abnormal   Collection Time   11/03/11  1:00 PM      Component Value Range Comment   Sodium 130 (*) 135 - 145 (mEq/L)    Potassium 3.9  3.5 - 5.1 (mEq/L)    Chloride 92 (*) 96 - 112 (mEq/L)    CO2 29  19 - 32 (mEq/L)    Glucose, Bld 112 (*) 70 - 99 (mg/dL)    BUN 15  6 - 23 (mg/dL)    Creatinine, Ser 1.61  0.50 - 1.10 (mg/dL)    Calcium 9.2  8.4 - 10.5 (mg/dL)    Total Protein 6.7  6.0 - 8.3 (g/dL)    Albumin 3.3 (*) 3.5 - 5.2 (g/dL)    AST 25  0 - 37 (U/L) SLIGHT HEMOLYSIS   ALT 24  0 - 35 (U/L)    Alkaline Phosphatase 88  39 - 117 (U/L)    Total Bilirubin 0.2 (*) 0.3 - 1.2 (mg/dL)    GFR calc non Af Amer 88 (*) >90 (mL/min)    GFR calc Af Amer >90  >90 (mL/min)   INFLUENZA PANEL BY PCR     Status: Abnormal   Collection Time   11/03/11  4:09 PM      Component Value Range Comment   Influenza A By PCR POSITIVE (*) NEGATIVE     Influenza B By PCR NEGATIVE  NEGATIVE     H1N1 flu by pcr NOT DETECTED  NOT DETECTED    BASIC METABOLIC PANEL     Status: Abnormal   Collection Time   11/04/11  3:55 AM      Component Value Range Comment   Sodium 131 (*) 135 - 145 (mEq/L)    Potassium 3.9  3.5 - 5.1 (mEq/L)    Chloride 94 (*) 96 - 112 (mEq/L)    CO2 28  19 - 32 (mEq/L)    Glucose, Bld 137 (*) 70 - 99 (mg/dL)    BUN 9  6 - 23 (mg/dL)    Creatinine, Ser 0.96  0.50 - 1.10 (mg/dL)    Calcium 8.8  8.4 - 10.5 (mg/dL)    GFR calc non Af Amer >90  >90 (mL/min)    GFR calc Af Amer >90  >90 (mL/min)   CBC     Status: Abnormal   Collection Time    11/04/11  3:55 AM      Component Value Range Comment   WBC 12.3 (*) 4.0 - 10.5 (K/uL)    RBC 4.49  3.87 - 5.11 (MIL/uL)    Hemoglobin 13.1  12.0 - 15.0 (g/dL)    HCT 04.5  40.9 - 81.1 (%)    MCV 86.9  78.0 - 100.0 (fL)  MCH 29.2  26.0 - 34.0 (pg)    MCHC 33.6  30.0 - 36.0 (g/dL)    RDW 16.1  09.6 - 04.5 (%)    Platelets 175  150 - 400 (K/uL)    Dg Chest 2 View  11/03/2011  *RADIOLOGY REPORT*  Clinical Data: Cough  CHEST - 2 VIEW  Comparison: 07/16/2011  Findings: Cardiomediastinal silhouette is stable.  Stable probable chronic mild interstitial prominence.  No focal infiltrate or pulmonary edema.  Stable osteopenia and mild degenerative changes thoracic spine. Again noted bilateral apical scarring.  IMPRESSION: No active disease.  Stable probable chronic mild interstitial prominence.  Original Report Authenticated By: Natasha Mead, M.D.    Review of Systems  Constitutional: Positive for fever and malaise/fatigue. Negative for weight loss and diaphoresis.  HENT: Positive for congestion. Negative for hearing loss, ear pain, sore throat, tinnitus and ear discharge.   Eyes: Negative.   Respiratory: Positive for cough, sputum production, shortness of breath and wheezing. Negative for hemoptysis and stridor.   Cardiovascular: Negative.   Gastrointestinal: Negative.   Genitourinary: Negative.   Musculoskeletal: Negative.   Skin: Negative.   Neurological: Negative.  Negative for weakness and headaches.  Psychiatric/Behavioral: Negative.     Blood pressure 144/77, pulse 78, temperature 97.8 F (36.6 C), temperature source Oral, resp. rate 20, height 5\' 2"  (1.575 m), weight 65.998 kg (145 lb 8 oz), SpO2 96.00%. Physical Exam  Constitutional: She is oriented to person, place, and time. She appears well-developed and well-nourished. No distress.       Appears tired and ill but able to speak in full sentences.  HENT:  Head: Normocephalic and atraumatic.  Nose: Nose normal.  Mouth/Throat:  Oropharynx is clear and moist. No oropharyngeal exudate.  Eyes: Conjunctivae and EOM are normal. Pupils are equal, round, and reactive to light. No scleral icterus.  Neck: Normal range of motion. Neck supple.  Cardiovascular: Normal rate, regular rhythm, normal heart sounds and intact distal pulses.  Exam reveals no gallop and no friction rub.   No murmur heard. Respiratory: Effort normal. No stridor. No respiratory distress. She has wheezes. She has rales (coarse breath sounds throughout). She exhibits no tenderness.  GI: Soft. Bowel sounds are normal. She exhibits no distension and no mass. There is no tenderness.  Musculoskeletal: Normal range of motion.  Lymphadenopathy:    She has no cervical adenopathy.  Neurological: She is alert and oriented to person, place, and time. No cranial nerve deficit.  Skin: Skin is warm and dry. No rash noted. She is not diaphoretic.  Psychiatric: She has a normal mood and affect. Her behavior is normal. Judgment and thought content normal.     Assessment/Plan This is a 76 yo woman with COPD experiencing a COPD exacerbation in the context of a likely viral respiratory infection.  We will cover her for community acquired pneumonia and treat for COPD and if her flu test is positive we will treat for this as well.  1. COPD exacerbation: - Start prednisone 30 mg daily. - Nebs prn - Continuous pulse oximetry - Supplemental O2 as needed.  2. Respiratory infection: - Ceftriaxone and azithromycin for CAP. - Test for flu, will treat if positive (ADDENDUM: flu screen was positive for influenza A and the patient was started on oseltamivir on the night of admission). - Sputum culture.  3. Hypothyroidism: Appears euthyroid. Will continue home levothyroxine dose.  Estiven Kohan 11/04/2011, 1:45 PM

## 2011-11-04 NOTE — Progress Notes (Addendum)
Subjective: States that she feels much better compared to yesterday  Objective: Vital signs in last 24 hours: Filed Vitals:   11/03/11 2035 11/03/11 2107 11/04/11 0549 11/04/11 0604  BP: 133/62  144/77   Pulse: 84  78   Temp: 98.6 F (37 C)  97.8 F (36.6 C)   TempSrc: Oral  Oral   Resp: 20  20   Height:      Weight:      SpO2: 93% 95% 96% 94%    Intake/Output Summary (Last 24 hours) at 11/04/11 1041 Last data filed at 11/04/11 0924  Gross per 24 hour  Intake    983 ml  Output   1600 ml  Net   -617 ml    Weight change:   General: Alert, awake, oriented x3, in no acute distress. HEENT: No bruits, no goiter. Heart: Regular rate and rhythm, without murmurs, rubs, gallops. Lungs: Clear to auscultation bilaterally. Abdomen: Soft, nontender, nondistended, positive bowel sounds. Extremities: No clubbing cyanosis or edema with positive pedal pulses. Neuro: Grossly intact, nonfocal.    Lab Results: Results for orders placed during the hospital encounter of 11/03/11 (from the past 24 hour(s))  URINALYSIS, ROUTINE W REFLEX MICROSCOPIC     Status: Abnormal   Collection Time   11/03/11 12:20 PM      Component Value Range   Color, Urine YELLOW  YELLOW    APPearance CLOUDY (*) CLEAR    Specific Gravity, Urine 1.021  1.005 - 1.030    pH 6.0  5.0 - 8.0    Glucose, UA NEGATIVE  NEGATIVE (mg/dL)   Hgb urine dipstick NEGATIVE  NEGATIVE    Bilirubin Urine NEGATIVE  NEGATIVE    Ketones, ur NEGATIVE  NEGATIVE (mg/dL)   Protein, ur NEGATIVE  NEGATIVE (mg/dL)   Urobilinogen, UA 0.2  0.0 - 1.0 (mg/dL)   Nitrite NEGATIVE  NEGATIVE    Leukocytes, UA NEGATIVE  NEGATIVE   CBC     Status: Abnormal   Collection Time   11/03/11  1:00 PM      Component Value Range   WBC 14.2 (*) 4.0 - 10.5 (K/uL)   RBC 4.64  3.87 - 5.11 (MIL/uL)   Hemoglobin 13.8  12.0 - 15.0 (g/dL)   HCT 16.1  09.6 - 04.5 (%)   MCV 86.9  78.0 - 100.0 (fL)   MCH 29.7  26.0 - 34.0 (pg)   MCHC 34.2  30.0 - 36.0 (g/dL)   RDW 40.9  81.1 - 91.4 (%)   Platelets 176  150 - 400 (K/uL)  DIFFERENTIAL     Status: Abnormal   Collection Time   11/03/11  1:00 PM      Component Value Range   Neutrophils Relative 84 (*) 43 - 77 (%)   Neutro Abs 11.8 (*) 1.7 - 7.7 (K/uL)   Lymphocytes Relative 6 (*) 12 - 46 (%)   Lymphs Abs 0.9  0.7 - 4.0 (K/uL)   Monocytes Relative 10  3 - 12 (%)   Monocytes Absolute 1.4 (*) 0.1 - 1.0 (K/uL)   Eosinophils Relative 0  0 - 5 (%)   Eosinophils Absolute 0.0  0.0 - 0.7 (K/uL)   Basophils Relative 0  0 - 1 (%)   Basophils Absolute 0.0  0.0 - 0.1 (K/uL)  COMPREHENSIVE METABOLIC PANEL     Status: Abnormal   Collection Time   11/03/11  1:00 PM      Component Value Range   Sodium 130 (*) 135 - 145 (  mEq/L)   Potassium 3.9  3.5 - 5.1 (mEq/L)   Chloride 92 (*) 96 - 112 (mEq/L)   CO2 29  19 - 32 (mEq/L)   Glucose, Bld 112 (*) 70 - 99 (mg/dL)   BUN 15  6 - 23 (mg/dL)   Creatinine, Ser 1.19  0.50 - 1.10 (mg/dL)   Calcium 9.2  8.4 - 14.7 (mg/dL)   Total Protein 6.7  6.0 - 8.3 (g/dL)   Albumin 3.3 (*) 3.5 - 5.2 (g/dL)   AST 25  0 - 37 (U/L)   ALT 24  0 - 35 (U/L)   Alkaline Phosphatase 88  39 - 117 (U/L)   Total Bilirubin 0.2 (*) 0.3 - 1.2 (mg/dL)   GFR calc non Af Amer 88 (*) >90 (mL/min)   GFR calc Af Amer >90  >90 (mL/min)  INFLUENZA PANEL BY PCR     Status: Abnormal   Collection Time   11/03/11  4:09 PM      Component Value Range   Influenza A By PCR POSITIVE (*) NEGATIVE    Influenza B By PCR NEGATIVE  NEGATIVE    H1N1 flu by pcr NOT DETECTED  NOT DETECTED   BASIC METABOLIC PANEL     Status: Abnormal   Collection Time   11/04/11  3:55 AM      Component Value Range   Sodium 131 (*) 135 - 145 (mEq/L)   Potassium 3.9  3.5 - 5.1 (mEq/L)   Chloride 94 (*) 96 - 112 (mEq/L)   CO2 28  19 - 32 (mEq/L)   Glucose, Bld 137 (*) 70 - 99 (mg/dL)   BUN 9  6 - 23 (mg/dL)   Creatinine, Ser 8.29  0.50 - 1.10 (mg/dL)   Calcium 8.8  8.4 - 56.2 (mg/dL)   GFR calc non Af Amer >90  >90 (mL/min)   GFR  calc Af Amer >90  >90 (mL/min)  CBC     Status: Abnormal   Collection Time   11/04/11  3:55 AM      Component Value Range   WBC 12.3 (*) 4.0 - 10.5 (K/uL)   RBC 4.49  3.87 - 5.11 (MIL/uL)   Hemoglobin 13.1  12.0 - 15.0 (g/dL)   HCT 13.0  86.5 - 78.4 (%)   MCV 86.9  78.0 - 100.0 (fL)   MCH 29.2  26.0 - 34.0 (pg)   MCHC 33.6  30.0 - 36.0 (g/dL)   RDW 69.6  29.5 - 28.4 (%)   Platelets 175  150 - 400 (K/uL)     Micro: No results found for this or any previous visit (from the past 240 hour(s)).  Studies/Results: Dg Chest 2 View  11/03/2011  *RADIOLOGY REPORT*  Clinical Data: Cough  CHEST - 2 VIEW  Comparison: 07/16/2011  Findings: Cardiomediastinal silhouette is stable.  Stable probable chronic mild interstitial prominence.  No focal infiltrate or pulmonary edema.  Stable osteopenia and mild degenerative changes thoracic spine. Again noted bilateral apical scarring.  IMPRESSION: No active disease.  Stable probable chronic mild interstitial prominence.  Original Report Authenticated By: Natasha Mead, M.D.    Medications:  Scheduled Meds:   . albuterol  2.5 mg Nebulization Q6H  . albuterol  5 mg Nebulization Once  . albuterol  5 mg Nebulization Once  . azithromycin (ZITHROMAX) 500 MG IVPB  500 mg Intravenous Once  . azithromycin  250 mg Oral Daily  . cefTRIAXone (ROCEPHIN)  IV  1 g Intravenous Once  . cefTRIAXone (ROCEPHIN)  IV  1 g Intravenous Q24H  . enoxaparin  40 mg Subcutaneous Q24H  . estradiol  0.05 mg Transdermal Weekly  . ipratropium  0.5 mg Nebulization Q6H  . levothyroxine  112 mcg Oral Daily  . oseltamivir  75 mg Oral BID  . predniSONE  30 mg Oral Q breakfast  . sodium chloride  500 mL Intravenous Once  . sodium chloride  3 mL Intravenous Q12H  . DISCONTD: albuterol  2.5 mg Nebulization BID  . DISCONTD: ipratropium  0.5 mg Nebulization Once  . DISCONTD: ipratropium  0.5 mg Nebulization BID  . DISCONTD: ipratropium-albuterol  3 mL Nebulization BID   Continuous Infusions:   PRN Meds:.sodium chloride, acetaminophen, acetaminophen, albuterol, polyvinyl alcohol, sodium chloride, DISCONTD: Polyethyl Glycol-Propyl Glycol Assessment:   #1 COPD exacerbation continue with empiric antibiotics and urine and, positive for influenza, continue Tamiflu. 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

## 2011-11-05 DIAGNOSIS — J438 Other emphysema: Secondary | ICD-10-CM | POA: Diagnosis not present

## 2011-11-05 DIAGNOSIS — E871 Hypo-osmolality and hyponatremia: Secondary | ICD-10-CM | POA: Diagnosis not present

## 2011-11-05 MED ORDER — DOXYCYCLINE HYCLATE 100 MG PO TABS
100.0000 mg | ORAL_TABLET | Freq: Two times a day (BID) | ORAL | Status: DC
  Administered 2011-11-05 – 2011-11-06 (×3): 100 mg via ORAL
  Filled 2011-11-05 (×4): qty 1

## 2011-11-05 MED ORDER — MOXIFLOXACIN HCL 400 MG PO TABS: 400.0000 mg | ORAL_TABLET | Freq: Every day | ORAL | Status: DC

## 2011-11-05 NOTE — Progress Notes (Signed)
Subjective: Feels better, still complains of some cough and chest congestion  Objective: Vital signs in last 24 hours: Filed Vitals:   11/04/11 2107 11/05/11 0143 11/05/11 0511 11/05/11 0934  BP: 119/71  138/73   Pulse: 70  65   Temp: 98.5 F (36.9 C)  98 F (36.7 C)   TempSrc: Oral  Oral   Resp: 16  18   Height:      Weight:      SpO2: 94% 96% 97% 99%    Intake/Output Summary (Last 24 hours) at 11/05/11 1001 Last data filed at 11/05/11 0908  Gross per 24 hour  Intake   1206 ml  Output   2150 ml  Net   -944 ml    Weight change:    General: Alert, awake, oriented x3, in no acute distress. HEENT: No bruits, no goiter. Heart: Regular rate and rhythm, without murmurs, rubs, gallops. Lungs basilar wheezing and rhonchi Abdomen: Soft, nontender, nondistended, positive bowel sounds. Extremities: No clubbing cyanosis or edema with positive pedal pulses. Neuro: Grossly intact, nonfocal.   Lab Results: No results found for this or any previous visit (from the past 24 hour(s)).   Micro: No results found for this or any previous visit (from the past 240 hour(s)).  Studies/Results: Dg Chest 2 View  11/03/2011  *RADIOLOGY REPORT*  Clinical Data: Cough  CHEST - 2 VIEW  Comparison: 07/16/2011  Findings: Cardiomediastinal silhouette is stable.  Stable probable chronic mild interstitial prominence.  No focal infiltrate or pulmonary edema.  Stable osteopenia and mild degenerative changes thoracic spine. Again noted bilateral apical scarring.  IMPRESSION: No active disease.  Stable probable chronic mild interstitial prominence.  Original Report Authenticated By: Natasha Mead, M.D.    Medications:  Scheduled Meds:   . albuterol  2.5 mg Nebulization Q6H  . enoxaparin  40 mg Subcutaneous Q24H  . estradiol  0.05 mg Transdermal Weekly  . ipratropium  0.5 mg Nebulization Q6H  . levothyroxine  112 mcg Oral Daily  . moxifloxacin  400 mg Oral q1800  . oseltamivir  75 mg Oral BID  .  predniSONE  30 mg Oral Q breakfast  . sodium chloride  3 mL Intravenous Q12H  . DISCONTD: azithromycin  250 mg Oral Daily  . DISCONTD: cefTRIAXone (ROCEPHIN)  IV  1 g Intravenous Q24H   Continuous Infusions:  PRN Meds:.sodium chloride, acetaminophen, acetaminophen, albuterol, polyvinyl alcohol, sodium chloride   Assessment:   1. COPD exacerbation:  - Continue prednisone by mouth - Nebs prn  - Continuous pulse oximetry  - Supplemental O2 as needed.   2. Respiratory infection:  - Ceftriaxone and azithromycin for CAP. These have been discontinued. Start Avelox by mouth Continue Tamiflu for 5 days - Sputum culture.   3. Hypothyroidism: Appears euthyroid. Will continue home levothyroxine dose.    disposition check oxygen saturation on room air, set up home O2 prescription if indicated. Anticipate discharge tomorrow     LOS: 2 days   [facility] 11/05/2011, 10:01 AM

## 2011-11-05 NOTE — Progress Notes (Signed)
Spoke with patient at bedside. Still congested but states improved, appetite is good. States lives at home alone, very independent, works a 40h week. Has access to meds, PCP is Dr. Selena Batten. No anticipated d/c needs, will continue to follow. Patient appreciative of visit.

## 2011-11-05 NOTE — Progress Notes (Signed)
Patient ambulated on Room air, O2 sat 97%.

## 2011-11-06 DIAGNOSIS — E871 Hypo-osmolality and hyponatremia: Secondary | ICD-10-CM | POA: Diagnosis not present

## 2011-11-06 DIAGNOSIS — J438 Other emphysema: Secondary | ICD-10-CM | POA: Diagnosis not present

## 2011-11-06 MED ORDER — PREDNISONE 10 MG PO TABS: ORAL_TABLET | ORAL | Status: DC

## 2011-11-06 MED ORDER — OSELTAMIVIR PHOSPHATE 75 MG PO CAPS: 75.0000 mg | ORAL_CAPSULE | Freq: Two times a day (BID) | ORAL | Status: AC

## 2011-11-06 MED ORDER — NYSTATIN 100000 UNIT/GM EX CREA
TOPICAL_CREAM | Freq: Two times a day (BID) | CUTANEOUS | Status: DC
  Administered 2011-11-06: 14:00:00 via TOPICAL
  Filled 2011-11-06: qty 15

## 2011-11-06 MED ORDER — DOXYCYCLINE HYCLATE 100 MG PO TABS: 100.0000 mg | ORAL_TABLET | Freq: Two times a day (BID) | ORAL | Status: AC

## 2011-11-06 NOTE — Discharge Summary (Signed)
Physician Discharge Summary  RYLEAH MIRAMONTES MRN: [id number] DOB/AGE: 1936-09-11 76 y.o.  PCP: Pearson Grippe, MD, MD   Admit date: 11/03/2011 Discharge date: 11/06/2011  Discharge Diagnoses:  COPD exacerbation Hypothyroidism Influenza A    Current Discharge Medication List    START taking these medications   Details  doxycycline (VIBRA-TABS) 100 MG tablet Take 1 tablet (100 mg total) by mouth 2 (two) times daily. Qty: 10 tablet, Refills: 0    oseltamivir (TAMIFLU) 75 MG capsule Take 1 capsule (75 mg total) by mouth 2 (two) times daily. Qty: 4 capsule, Refills: 0      CONTINUE these medications which have CHANGED   Details  predniSONE (DELTASONE) 10 MG tablet 10 mg tablets, 4 tablets for 4 days, 3 tablets for 4 days, 2 tablets for 4 days, one tablet for 4 days, half tablet for 4 days then DC Qty: 100 tablet, Refills: 0      CONTINUE these medications which have NOT CHANGED   Details  ipratropium-albuterol (DUONEB) 0.5-2.5 (3) MG/3ML SOLN Take 3 mLs by nebulization 2 (two) times daily.      levothyroxine (SYNTHROID, LEVOTHROID) 112 MCG tablet Take 112 mcg by mouth daily.     Polyethyl Glycol-Propyl Glycol (SYSTANE OP) Apply 1 drop to eye as needed. For dry eyes       STOP taking these medications     estradiol (VIVELLE-DOT) 0.0375 MG/24HR         Discharge Condition: Stable Disposition: Home or Self Care   Consults: None  Significant Diagnostic Studies: Dg Chest 2 View  11/03/2011  *RADIOLOGY REPORT*  Clinical Data: Cough  CHEST - 2 VIEW  Comparison: 07/16/2011  Findings: Cardiomediastinal silhouette is stable.  Stable probable chronic mild interstitial prominence.  No focal infiltrate or pulmonary edema.  Stable osteopenia and mild degenerative changes thoracic spine. Again noted bilateral apical scarring.  IMPRESSION: No active disease.  Stable probable chronic mild interstitial prominence.  Original Report Authenticated By: Natasha Mead, M.D.      Microbiology: No results found for this or any previous visit (from the past 240 hour(s)).   Labs: No results found for this or any previous visit (from the past 48 hour(s)).   HPI :Ms. Barthel is a very pleasant 76 yo woman with a history of COPD and hypothyroidism who presents to the ED with several days of worsening cough and shortness of breath. About three days ago she developed symptoms consistent with an upper respiratory infection but the day before admission, she became significantly more short of breath. She had some prednisone at home which she took (10 or 20 mg) but did not get any relief, nor did her nebs help. Until this recent URI, her COPD symptoms had been pretty stable. She is not on oxygen at home. She did get her flu shot this year. She denies recent sick contacts.  HOSPITAL COURSE:   #1 COPD exacerbation influenza A PCR was positive, she was treated with Tamiflu, Rocephin and Zithromax. She has been switched to by mouth doxycycline. She will complete Tamiflu on January 6. She will continue with her nebulizer treatments and prednisone taper. Her oxygen requirements were assessed. She was 97% on room air with ambulation. She is being discharged home today.   Discharge Exam:  Blood pressure 122/71, pulse 66, temperature 97.8 F (36.6 C), temperature source Oral, resp. rate 16, height 5\' 2"  (1.575 m), weight 65.998 kg (145 lb 8 oz), SpO2 97.00%.   General: Alert, awake, oriented x3,  in no acute distress. HEENT: No bruits, no goiter. Heart: Regular rate and rhythm, without murmurs, rubs, gallops. Lungs: Clear to auscultation bilaterally. Abdomen: Soft, nontender, nondistended, positive bowel sounds. Extremities: No clubbing cyanosis or edema with positive pedal pulses. Neuro: Grossly intact, nonfocal.    Discharge Orders    Future Orders Please Complete By Expires   Diet - low sodium heart healthy      Increase activity slowly      Call MD for:  temperature >100.4       Call MD for:  severe uncontrolled pain      Call MD for:  difficulty breathing, headache or visual disturbances         Follow-up Information    Follow up with Pearson Grippe, MD .one week          Signed: Richarda Overlie 11/06/2011, 10:36 AM

## 2011-11-06 NOTE — Progress Notes (Signed)
Patient discharged to home, all discharge medications and instructions reviewed and questions answered. Pt to be assisted to vehicle by wheelchair.

## 2011-11-11 DIAGNOSIS — J449 Chronic obstructive pulmonary disease, unspecified: Secondary | ICD-10-CM | POA: Diagnosis not present

## 2012-01-11 DIAGNOSIS — E039 Hypothyroidism, unspecified: Secondary | ICD-10-CM | POA: Diagnosis not present

## 2012-01-11 DIAGNOSIS — E785 Hyperlipidemia, unspecified: Secondary | ICD-10-CM | POA: Diagnosis not present

## 2012-01-11 DIAGNOSIS — Z79899 Other long term (current) drug therapy: Secondary | ICD-10-CM | POA: Diagnosis not present

## 2012-01-14 DIAGNOSIS — E871 Hypo-osmolality and hyponatremia: Secondary | ICD-10-CM | POA: Diagnosis not present

## 2012-01-14 DIAGNOSIS — R209 Unspecified disturbances of skin sensation: Secondary | ICD-10-CM | POA: Diagnosis not present

## 2012-01-14 DIAGNOSIS — J8410 Pulmonary fibrosis, unspecified: Secondary | ICD-10-CM | POA: Diagnosis not present

## 2012-01-14 DIAGNOSIS — E039 Hypothyroidism, unspecified: Secondary | ICD-10-CM | POA: Diagnosis not present

## 2012-01-14 DIAGNOSIS — R238 Other skin changes: Secondary | ICD-10-CM | POA: Diagnosis not present

## 2012-01-22 DIAGNOSIS — H40019 Open angle with borderline findings, low risk, unspecified eye: Secondary | ICD-10-CM | POA: Diagnosis not present

## 2012-01-29 ENCOUNTER — Ambulatory Visit
Admission: RE | Admit: 2012-01-29 | Discharge: 2012-01-29 | Disposition: A | Discharge status: Home / Self Care | Payer: Medicare Other | Source: Ambulatory Visit | Attending: Gynecology | Admitting: Gynecology

## 2012-01-29 DIAGNOSIS — Z1231 Encounter for screening mammogram for malignant neoplasm of breast: Secondary | ICD-10-CM

## 2012-02-02 ENCOUNTER — Other Ambulatory Visit: Payer: Self-pay | Admitting: Gynecology

## 2012-02-02 DIAGNOSIS — M5126 Other intervertebral disc displacement, lumbar region: Secondary | ICD-10-CM | POA: Diagnosis not present

## 2012-02-02 DIAGNOSIS — R928 Other abnormal and inconclusive findings on diagnostic imaging of breast: Secondary | ICD-10-CM

## 2012-02-04 DIAGNOSIS — M899 Disorder of bone, unspecified: Secondary | ICD-10-CM | POA: Diagnosis not present

## 2012-02-04 DIAGNOSIS — M949 Disorder of cartilage, unspecified: Secondary | ICD-10-CM | POA: Diagnosis not present

## 2012-02-04 DIAGNOSIS — Z01419 Encounter for gynecological examination (general) (routine) without abnormal findings: Secondary | ICD-10-CM | POA: Diagnosis not present

## 2012-02-04 DIAGNOSIS — E2839 Other primary ovarian failure: Secondary | ICD-10-CM | POA: Diagnosis not present

## 2012-02-05 ENCOUNTER — Ambulatory Visit
Admission: RE | Admit: 2012-02-05 | Discharge: 2012-02-05 | Disposition: A | Discharge status: Home / Self Care | Payer: Medicare Other | Source: Ambulatory Visit | Attending: Gynecology | Admitting: Gynecology

## 2012-02-05 DIAGNOSIS — R928 Other abnormal and inconclusive findings on diagnostic imaging of breast: Secondary | ICD-10-CM

## 2012-02-10 DIAGNOSIS — M5126 Other intervertebral disc displacement, lumbar region: Secondary | ICD-10-CM | POA: Diagnosis not present

## 2012-02-16 DIAGNOSIS — Z961 Presence of intraocular lens: Secondary | ICD-10-CM | POA: Diagnosis not present

## 2012-02-16 DIAGNOSIS — H40029 Open angle with borderline findings, high risk, unspecified eye: Secondary | ICD-10-CM | POA: Diagnosis not present

## 2012-02-16 DIAGNOSIS — H04129 Dry eye syndrome of unspecified lacrimal gland: Secondary | ICD-10-CM | POA: Diagnosis not present

## 2012-02-16 DIAGNOSIS — D3130 Benign neoplasm of unspecified choroid: Secondary | ICD-10-CM | POA: Diagnosis not present

## 2012-08-05 DIAGNOSIS — H4011X0 Primary open-angle glaucoma, stage unspecified: Secondary | ICD-10-CM | POA: Diagnosis not present

## 2012-10-03 DIAGNOSIS — M999 Biomechanical lesion, unspecified: Secondary | ICD-10-CM | POA: Diagnosis not present

## 2012-10-03 DIAGNOSIS — IMO0002 Reserved for concepts with insufficient information to code with codable children: Secondary | ICD-10-CM | POA: Diagnosis not present

## 2012-10-03 DIAGNOSIS — M5126 Other intervertebral disc displacement, lumbar region: Secondary | ICD-10-CM | POA: Diagnosis not present

## 2012-10-04 DIAGNOSIS — IMO0002 Reserved for concepts with insufficient information to code with codable children: Secondary | ICD-10-CM | POA: Diagnosis not present

## 2012-10-04 DIAGNOSIS — M999 Biomechanical lesion, unspecified: Secondary | ICD-10-CM | POA: Diagnosis not present

## 2012-10-04 DIAGNOSIS — M5126 Other intervertebral disc displacement, lumbar region: Secondary | ICD-10-CM | POA: Diagnosis not present

## 2012-10-06 DIAGNOSIS — M999 Biomechanical lesion, unspecified: Secondary | ICD-10-CM | POA: Diagnosis not present

## 2012-10-06 DIAGNOSIS — M5126 Other intervertebral disc displacement, lumbar region: Secondary | ICD-10-CM | POA: Diagnosis not present

## 2012-10-06 DIAGNOSIS — IMO0002 Reserved for concepts with insufficient information to code with codable children: Secondary | ICD-10-CM | POA: Diagnosis not present

## 2012-10-10 DIAGNOSIS — M999 Biomechanical lesion, unspecified: Secondary | ICD-10-CM | POA: Diagnosis not present

## 2012-10-10 DIAGNOSIS — M5126 Other intervertebral disc displacement, lumbar region: Secondary | ICD-10-CM | POA: Diagnosis not present

## 2012-10-10 DIAGNOSIS — IMO0002 Reserved for concepts with insufficient information to code with codable children: Secondary | ICD-10-CM | POA: Diagnosis not present

## 2012-10-17 DIAGNOSIS — M999 Biomechanical lesion, unspecified: Secondary | ICD-10-CM | POA: Diagnosis not present

## 2012-10-17 DIAGNOSIS — M5020 Other cervical disc displacement, unspecified cervical region: Secondary | ICD-10-CM | POA: Diagnosis not present

## 2012-10-17 DIAGNOSIS — M5124 Other intervertebral disc displacement, thoracic region: Secondary | ICD-10-CM | POA: Diagnosis not present

## 2012-10-17 DIAGNOSIS — M9981 Other biomechanical lesions of cervical region: Secondary | ICD-10-CM | POA: Diagnosis not present

## 2012-10-19 DIAGNOSIS — M9981 Other biomechanical lesions of cervical region: Secondary | ICD-10-CM | POA: Diagnosis not present

## 2012-10-19 DIAGNOSIS — M5020 Other cervical disc displacement, unspecified cervical region: Secondary | ICD-10-CM | POA: Diagnosis not present

## 2012-10-19 DIAGNOSIS — M5124 Other intervertebral disc displacement, thoracic region: Secondary | ICD-10-CM | POA: Diagnosis not present

## 2012-10-19 DIAGNOSIS — M999 Biomechanical lesion, unspecified: Secondary | ICD-10-CM | POA: Diagnosis not present

## 2012-10-20 DIAGNOSIS — M999 Biomechanical lesion, unspecified: Secondary | ICD-10-CM | POA: Diagnosis not present

## 2012-10-20 DIAGNOSIS — M9981 Other biomechanical lesions of cervical region: Secondary | ICD-10-CM | POA: Diagnosis not present

## 2012-10-20 DIAGNOSIS — M5020 Other cervical disc displacement, unspecified cervical region: Secondary | ICD-10-CM | POA: Diagnosis not present

## 2012-10-20 DIAGNOSIS — M5124 Other intervertebral disc displacement, thoracic region: Secondary | ICD-10-CM | POA: Diagnosis not present

## 2012-11-07 DIAGNOSIS — M999 Biomechanical lesion, unspecified: Secondary | ICD-10-CM | POA: Diagnosis not present

## 2012-11-07 DIAGNOSIS — M5020 Other cervical disc displacement, unspecified cervical region: Secondary | ICD-10-CM | POA: Diagnosis not present

## 2012-11-07 DIAGNOSIS — M9981 Other biomechanical lesions of cervical region: Secondary | ICD-10-CM | POA: Diagnosis not present

## 2012-11-07 DIAGNOSIS — M5124 Other intervertebral disc displacement, thoracic region: Secondary | ICD-10-CM | POA: Diagnosis not present

## 2012-11-08 DIAGNOSIS — M5020 Other cervical disc displacement, unspecified cervical region: Secondary | ICD-10-CM | POA: Diagnosis not present

## 2012-11-08 DIAGNOSIS — M999 Biomechanical lesion, unspecified: Secondary | ICD-10-CM | POA: Diagnosis not present

## 2012-11-08 DIAGNOSIS — M5124 Other intervertebral disc displacement, thoracic region: Secondary | ICD-10-CM | POA: Diagnosis not present

## 2012-11-08 DIAGNOSIS — M9981 Other biomechanical lesions of cervical region: Secondary | ICD-10-CM | POA: Diagnosis not present

## 2012-11-09 DIAGNOSIS — M5020 Other cervical disc displacement, unspecified cervical region: Secondary | ICD-10-CM | POA: Diagnosis not present

## 2012-11-09 DIAGNOSIS — M5124 Other intervertebral disc displacement, thoracic region: Secondary | ICD-10-CM | POA: Diagnosis not present

## 2012-11-09 DIAGNOSIS — M999 Biomechanical lesion, unspecified: Secondary | ICD-10-CM | POA: Diagnosis not present

## 2012-11-09 DIAGNOSIS — M9981 Other biomechanical lesions of cervical region: Secondary | ICD-10-CM | POA: Diagnosis not present

## 2012-11-14 DIAGNOSIS — M5124 Other intervertebral disc displacement, thoracic region: Secondary | ICD-10-CM | POA: Diagnosis not present

## 2012-11-14 DIAGNOSIS — M5020 Other cervical disc displacement, unspecified cervical region: Secondary | ICD-10-CM | POA: Diagnosis not present

## 2012-11-14 DIAGNOSIS — M999 Biomechanical lesion, unspecified: Secondary | ICD-10-CM | POA: Diagnosis not present

## 2012-11-14 DIAGNOSIS — M9981 Other biomechanical lesions of cervical region: Secondary | ICD-10-CM | POA: Diagnosis not present

## 2012-11-16 DIAGNOSIS — M9981 Other biomechanical lesions of cervical region: Secondary | ICD-10-CM | POA: Diagnosis not present

## 2012-11-16 DIAGNOSIS — M999 Biomechanical lesion, unspecified: Secondary | ICD-10-CM | POA: Diagnosis not present

## 2012-11-16 DIAGNOSIS — M5020 Other cervical disc displacement, unspecified cervical region: Secondary | ICD-10-CM | POA: Diagnosis not present

## 2012-11-16 DIAGNOSIS — M5124 Other intervertebral disc displacement, thoracic region: Secondary | ICD-10-CM | POA: Diagnosis not present

## 2012-11-28 DIAGNOSIS — R209 Unspecified disturbances of skin sensation: Secondary | ICD-10-CM | POA: Diagnosis not present

## 2012-11-28 DIAGNOSIS — M25549 Pain in joints of unspecified hand: Secondary | ICD-10-CM | POA: Diagnosis not present

## 2012-12-17 IMAGING — RF DG MYELOGRAM LUMBAR
6 of 23 series · 6 of 23 positions shown · IV contrast (omnipaque)
Comparison: None.

MYELOGRAM INJECTION
TECHNIQUE: Informed consent was obtained from the patient prior to
the procedure, including potential complications of headache,
allergy, infection and pain. Specific instructions were given
regarding 24 hour bedrest post procedure to prevent post-LP
headache.  A timeout procedure was performed.  With the patient
prone, the lower back was prepped with Betadine.  1% Lidocaine was
used for local anesthesia.  Lumbar puncture was performed by the
radiologist at the L2-L3 level using a 22 gauge needle with return
of clear CSF.  15 cc of Omnipaque 180 was injected into the
subarachnoid space .
CLINICAL DATA: Low back pain.  Right leg pain.
TECHNIQUE: Multidetector CT imaging of the lumbar spine was
performed following myelography.  Multiplanar CT image
reconstructions were also generated.

[Series 1: (hospital) · 1 of 1 slices shown (1 of 2)]
[im 1/1]
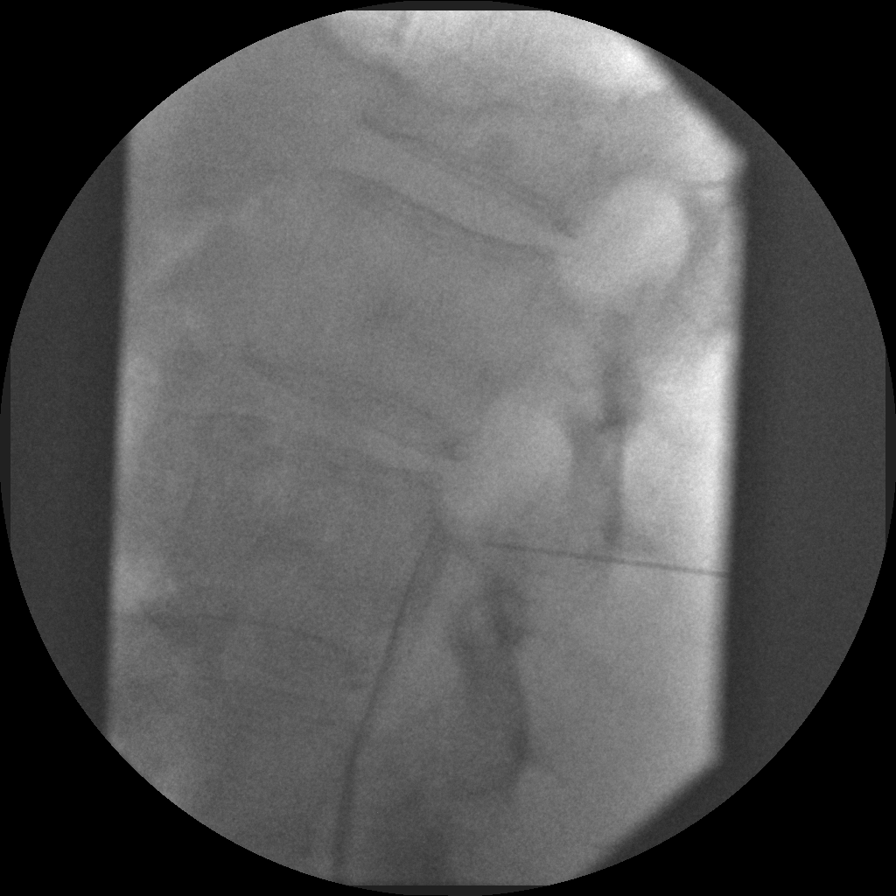

[Series 2: myelogram  white · 1 of 1 slices shown (1 of 4)]
[im 1/1]
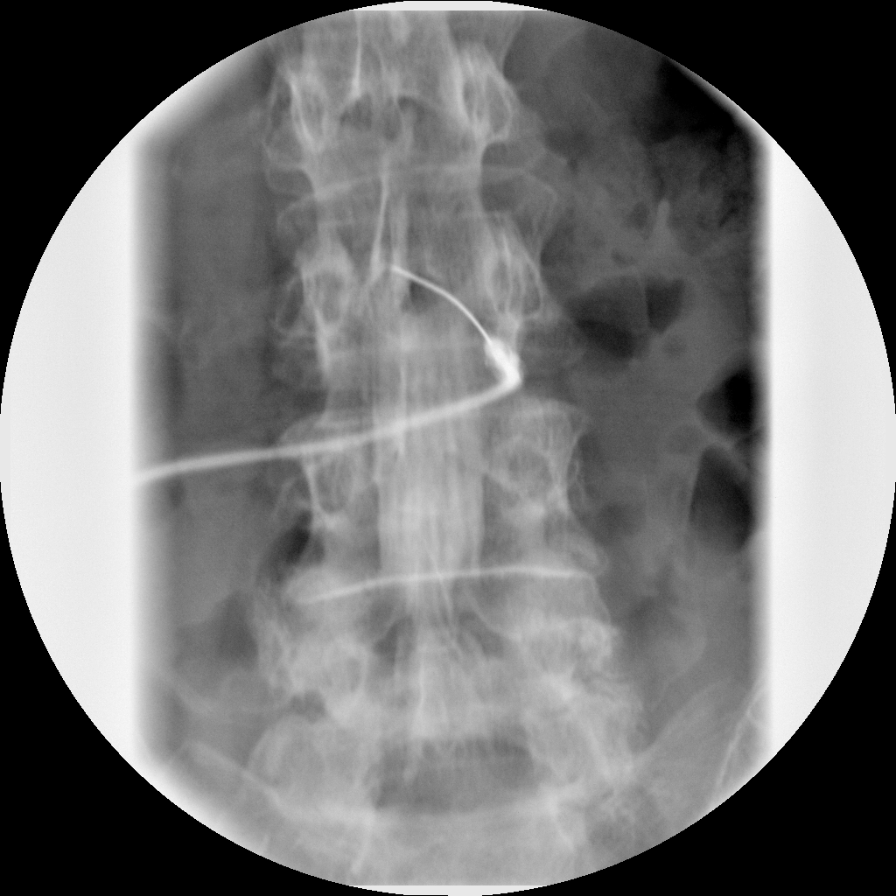

[Series 3: (hospital) · 1 of 1 slices shown (2 of 2)]
[im 1/1]
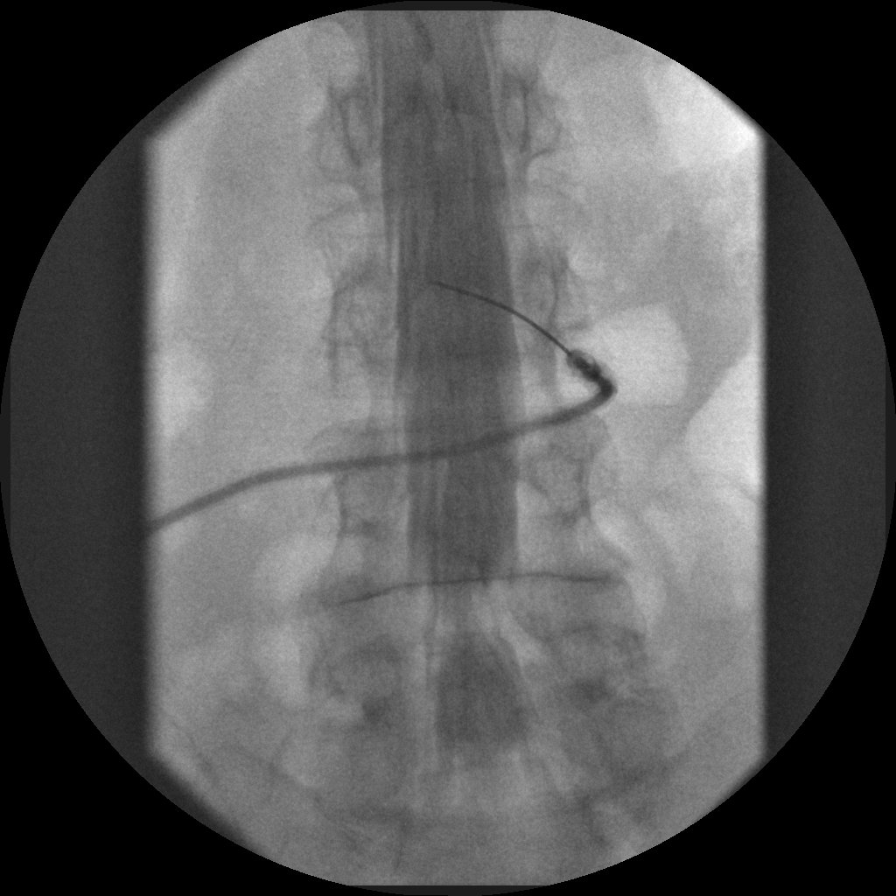

[Series 5: myelogram  white · 1 of 1 slices shown (2 of 4)]
[im 1/1]
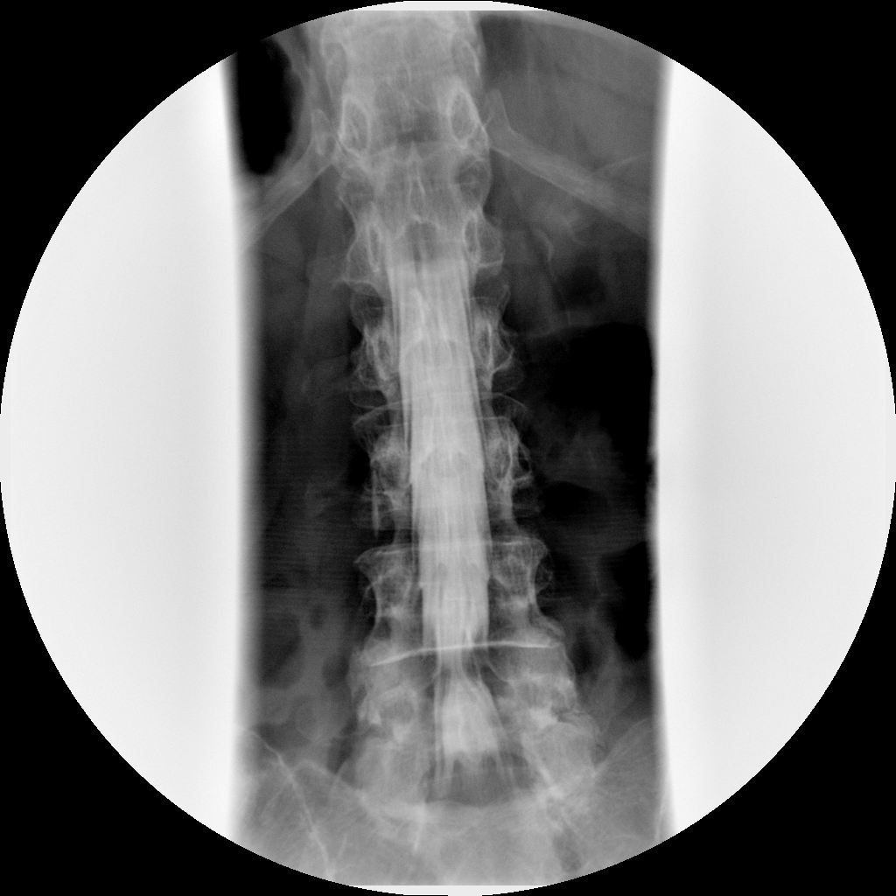

[Series 6: myelogram  white · 1 of 1 slices shown (3 of 4)]
[im 1/1]
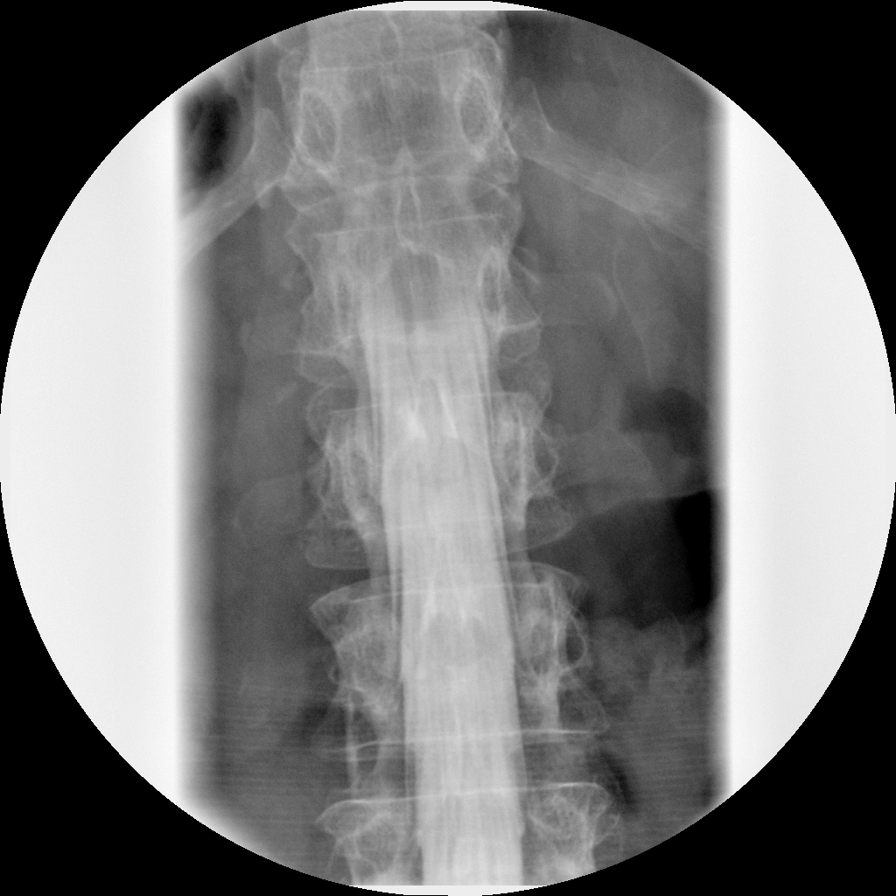

[Series 7: myelogram  white · 1 of 1 slices shown (4 of 4)]
[im 1/1]
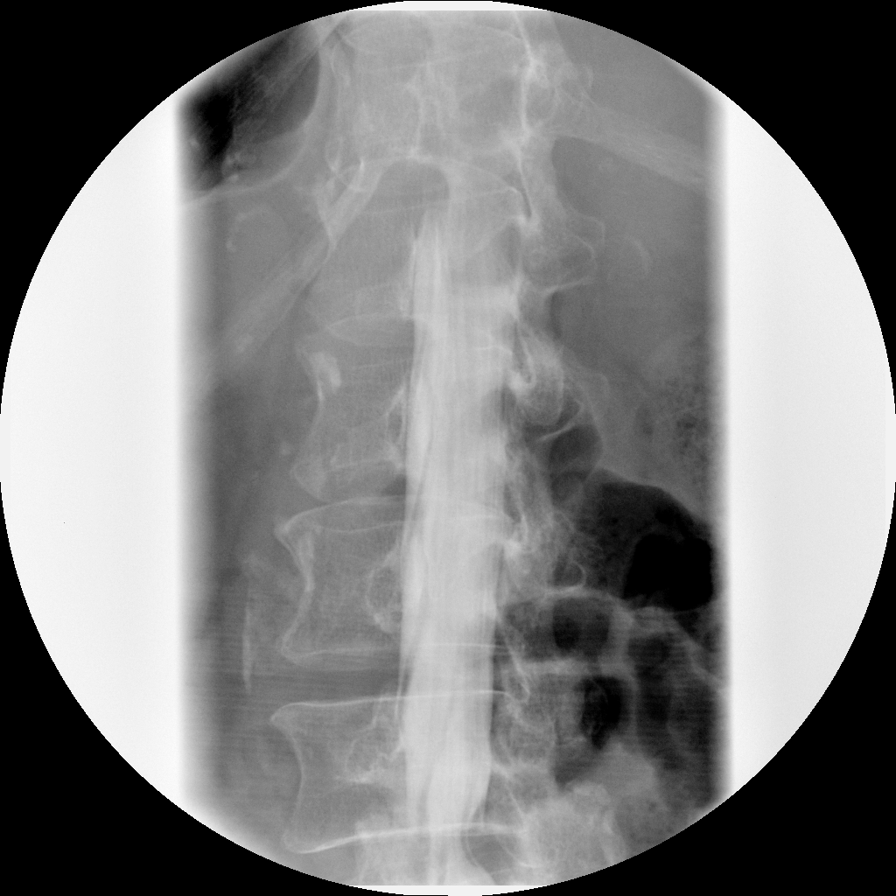

[6 of 23 positions shown; findings below may reference images not displayed]

IMPRESSION: Successful injection of  intrathecal contrast for myelography.

MYELOGRAM LUMBAR
FINDINGS: Good opacification lumbar subarachnoid space.  Waist like
narrowing at L4-5 with compression of the thecal sac and both
exiting L5 nerve roots, worse on the right. Both anterior and
posterior compressive elements are observed.  With the patient
standing, the stenosis worsens slightly.

The alignment is anatomic with the patient prone for myelography.
With the patient upright, there is 2 mm anterolisthesis of L4
forward on L5 in neutral and extension.  In flexion, this
anterolisthesis increases to 3 mm.  There is mild vascular
calcification.

Fluoroscopy Time: 1.03 minutes
IMPRESSION: As above.

CT MYELOGRAPHY LUMBAR SPINE
FINDINGS: Normal conus.  There is nonaneurysmal atherosclerotic
calcification of the aorta.  Moderate extrarenal pelvis on the
right.

L1-2: Normal interspace.

L2-3: Normal interspace.

L3-4: Mild facet arthropathy.  No stenosis or disc protrusion.

L4-5: Advanced facet arthropathy and ligamentum flavum hypertrophy.
This is worse on the right.  There is a  6 x 7 mm synovial cyst
emanating from the right-sided facet joint and projecting medially
into the spinal canal.  There is no significant anterolisthesis L4
on L5.  There is central bulging of the annular fibers.  Right
greater than left L5 nerve root encroachment is present in the
canal.  There is multifactorial neural foraminal narrowing with
right greater than left L4 nerve root encroachment.

L5-S1: Advanced facet arthropathy.  No compressive lesion.  Mild
annular bulging without central protrusion.
IMPRESSION: Multifactorial spinal stenosis at L4-L5 related to posterior
element hypertrophy and central bulging annular fibers.  Right
greater than left L4 and L5 neural encroachment is observed. A 6 x
7 mm right-sided synovial cyst contributes to asymmetric right L5
nerve root encroachment.

Mild dynamic instability with the patient upright at L4-L5.

Bilateral facet arthropathy and central bulging annular fibers at
L5-S1 are non compressive.

## 2013-01-03 IMAGING — CR DG SPINE 1V PORT
1 series · 1 of 1 positions shown · non-contrast
Comparison: Earlier same date and 07/23/2011.

CLINICAL DATA: Lumbar decompression L4-L5.

DG SPINE PORTABLE - 1 VIEW

[lateral]
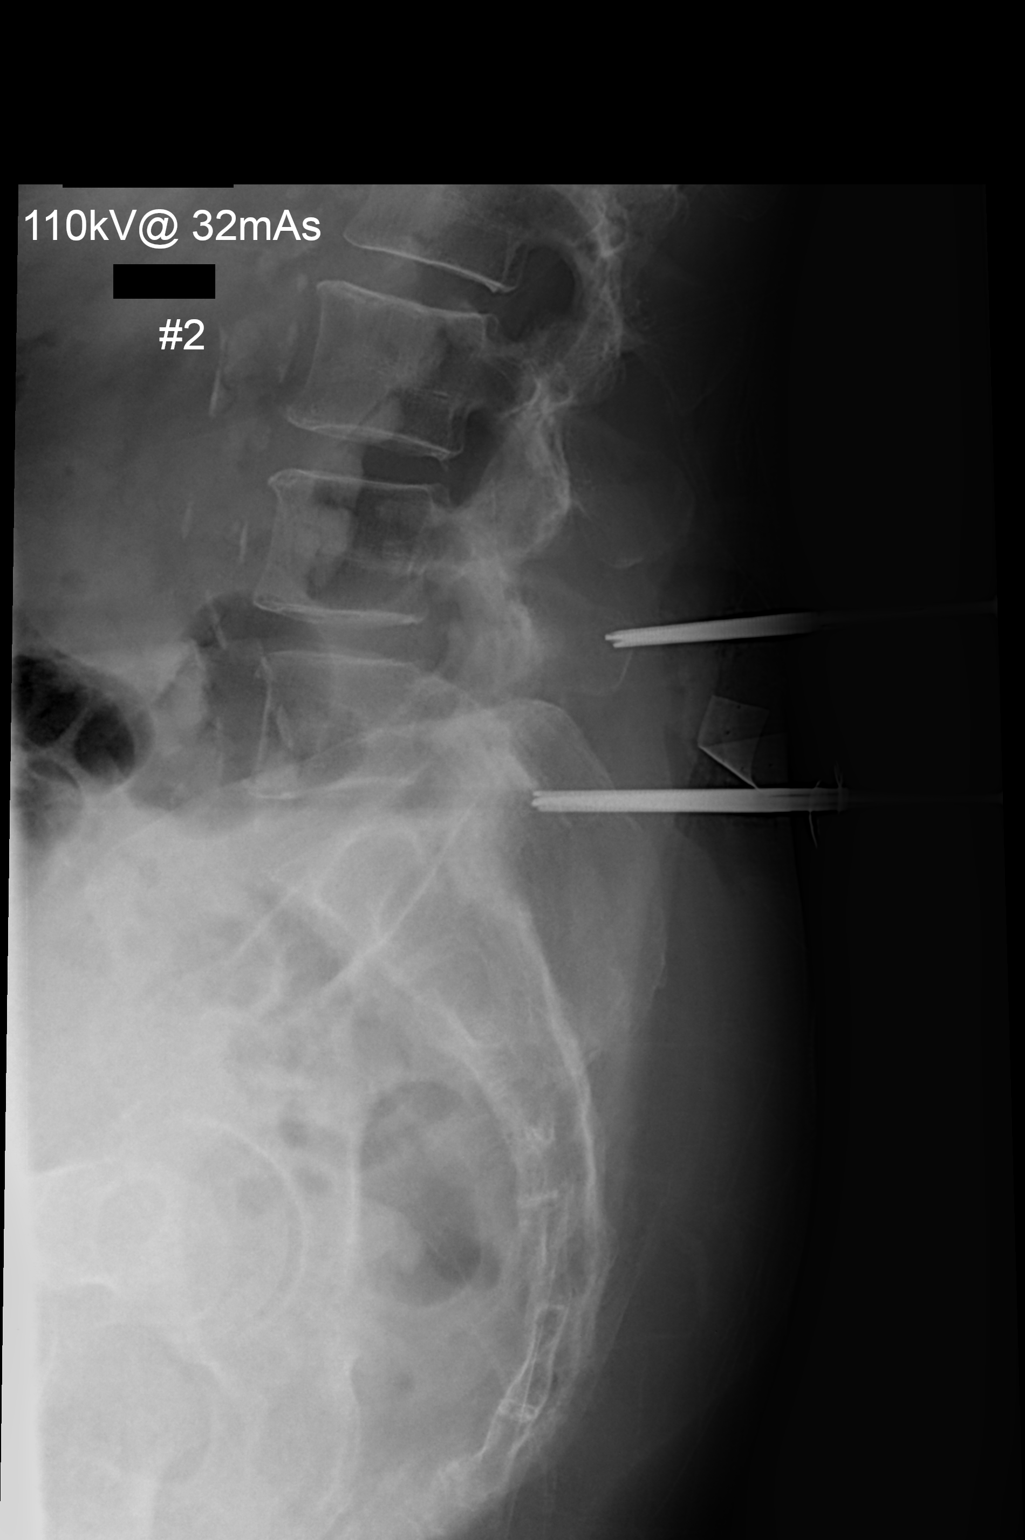

[1 of 1 positions shown; findings below may reference images not displayed]

FINDINGS: Cross-table lateral view labeled #2 at 0709 hours.
Surgical instruments overlie the L4 and L5 spinous processes.  Both
instruments are directed towards the corresponding L4-L5 and L5-S1
disc space levels.
IMPRESSION: Lumbar localization as described.

## 2013-02-10 DIAGNOSIS — L259 Unspecified contact dermatitis, unspecified cause: Secondary | ICD-10-CM | POA: Diagnosis not present

## 2013-02-16 DIAGNOSIS — H04129 Dry eye syndrome of unspecified lacrimal gland: Secondary | ICD-10-CM | POA: Diagnosis not present

## 2013-02-16 DIAGNOSIS — H40029 Open angle with borderline findings, high risk, unspecified eye: Secondary | ICD-10-CM | POA: Diagnosis not present

## 2013-02-16 DIAGNOSIS — Z961 Presence of intraocular lens: Secondary | ICD-10-CM | POA: Diagnosis not present

## 2013-03-08 DIAGNOSIS — E039 Hypothyroidism, unspecified: Secondary | ICD-10-CM | POA: Diagnosis not present

## 2013-03-28 DIAGNOSIS — R05 Cough: Secondary | ICD-10-CM | POA: Diagnosis not present

## 2013-03-28 DIAGNOSIS — E039 Hypothyroidism, unspecified: Secondary | ICD-10-CM | POA: Diagnosis not present

## 2013-03-28 DIAGNOSIS — J309 Allergic rhinitis, unspecified: Secondary | ICD-10-CM | POA: Diagnosis not present

## 2013-03-28 DIAGNOSIS — R42 Dizziness and giddiness: Secondary | ICD-10-CM | POA: Diagnosis not present

## 2013-04-03 ENCOUNTER — Other Ambulatory Visit: Payer: Self-pay

## 2013-04-03 DIAGNOSIS — Z1231 Encounter for screening mammogram for malignant neoplasm of breast: Secondary | ICD-10-CM

## 2013-04-27 ENCOUNTER — Ambulatory Visit: Payer: Medicare Other

## 2013-05-11 ENCOUNTER — Ambulatory Visit: Payer: Medicare Other

## 2013-05-12 ENCOUNTER — Ambulatory Visit
Admission: RE | Admit: 2013-05-12 | Discharge: 2013-05-12 | Disposition: A | Discharge status: Home / Self Care | Payer: Medicare Other | Source: Ambulatory Visit

## 2013-05-12 DIAGNOSIS — Z1231 Encounter for screening mammogram for malignant neoplasm of breast: Secondary | ICD-10-CM | POA: Diagnosis not present

## 2013-07-21 DIAGNOSIS — Z23 Encounter for immunization: Secondary | ICD-10-CM | POA: Diagnosis not present

## 2013-08-29 DIAGNOSIS — H18419 Arcus senilis, unspecified eye: Secondary | ICD-10-CM | POA: Diagnosis not present

## 2013-08-29 DIAGNOSIS — H11159 Pinguecula, unspecified eye: Secondary | ICD-10-CM | POA: Diagnosis not present

## 2013-08-29 DIAGNOSIS — H409 Unspecified glaucoma: Secondary | ICD-10-CM | POA: Diagnosis not present

## 2013-08-29 DIAGNOSIS — H4011X0 Primary open-angle glaucoma, stage unspecified: Secondary | ICD-10-CM | POA: Diagnosis not present

## 2013-10-02 DIAGNOSIS — Z0000 Encounter for general adult medical examination without abnormal findings: Secondary | ICD-10-CM | POA: Diagnosis not present

## 2013-10-02 DIAGNOSIS — E039 Hypothyroidism, unspecified: Secondary | ICD-10-CM | POA: Diagnosis not present

## 2013-11-06 DIAGNOSIS — E785 Hyperlipidemia, unspecified: Secondary | ICD-10-CM | POA: Diagnosis not present

## 2013-11-06 DIAGNOSIS — E039 Hypothyroidism, unspecified: Secondary | ICD-10-CM | POA: Diagnosis not present

## 2013-11-06 DIAGNOSIS — Z0000 Encounter for general adult medical examination without abnormal findings: Secondary | ICD-10-CM | POA: Diagnosis not present

## 2013-11-06 DIAGNOSIS — Z780 Asymptomatic menopausal state: Secondary | ICD-10-CM | POA: Diagnosis not present

## 2014-03-07 DIAGNOSIS — Z961 Presence of intraocular lens: Secondary | ICD-10-CM | POA: Diagnosis not present

## 2014-03-07 DIAGNOSIS — H4011X0 Primary open-angle glaucoma, stage unspecified: Secondary | ICD-10-CM | POA: Diagnosis not present

## 2014-03-07 DIAGNOSIS — D3130 Benign neoplasm of unspecified choroid: Secondary | ICD-10-CM | POA: Diagnosis not present

## 2014-03-07 DIAGNOSIS — H04129 Dry eye syndrome of unspecified lacrimal gland: Secondary | ICD-10-CM | POA: Diagnosis not present

## 2014-03-08 ENCOUNTER — Ambulatory Visit
Admission: RE | Admit: 2014-03-08 | Discharge: 2014-03-08 | Disposition: A | Discharge status: Home / Self Care | Payer: PRIVATE HEALTH INSURANCE | Source: Ambulatory Visit | Attending: Internal Medicine | Admitting: Internal Medicine

## 2014-03-08 ENCOUNTER — Encounter (INDEPENDENT_AMBULATORY_CARE_PROVIDER_SITE_OTHER): Payer: Self-pay

## 2014-03-08 ENCOUNTER — Other Ambulatory Visit: Payer: Self-pay | Admitting: Internal Medicine

## 2014-03-08 DIAGNOSIS — M79605 Pain in left leg: Principal | ICD-10-CM

## 2014-03-08 DIAGNOSIS — M2559 Pain in other specified joint: Secondary | ICD-10-CM | POA: Diagnosis not present

## 2014-03-08 DIAGNOSIS — Z86718 Personal history of other venous thrombosis and embolism: Secondary | ICD-10-CM | POA: Diagnosis not present

## 2014-03-08 DIAGNOSIS — M79604 Pain in right leg: Secondary | ICD-10-CM

## 2014-03-08 DIAGNOSIS — M79609 Pain in unspecified limb: Secondary | ICD-10-CM | POA: Diagnosis not present

## 2014-03-19 DIAGNOSIS — M48061 Spinal stenosis, lumbar region without neurogenic claudication: Secondary | ICD-10-CM | POA: Diagnosis not present

## 2014-04-05 ENCOUNTER — Ambulatory Visit: Payer: Medicare Other | Admitting: Sports Medicine

## 2014-04-30 DIAGNOSIS — Z9889 Other specified postprocedural states: Secondary | ICD-10-CM | POA: Diagnosis not present

## 2014-05-07 DIAGNOSIS — H905 Unspecified sensorineural hearing loss: Secondary | ICD-10-CM | POA: Diagnosis not present

## 2014-05-07 DIAGNOSIS — H7409 Tympanosclerosis, unspecified ear: Secondary | ICD-10-CM | POA: Diagnosis not present

## 2014-05-07 DIAGNOSIS — H93299 Other abnormal auditory perceptions, unspecified ear: Secondary | ICD-10-CM | POA: Diagnosis not present

## 2014-06-04 DIAGNOSIS — R300 Dysuria: Secondary | ICD-10-CM | POA: Diagnosis not present

## 2014-06-04 DIAGNOSIS — N390 Urinary tract infection, site not specified: Secondary | ICD-10-CM | POA: Diagnosis not present

## 2014-06-12 DIAGNOSIS — M545 Low back pain, unspecified: Secondary | ICD-10-CM | POA: Diagnosis not present

## 2014-06-12 DIAGNOSIS — Z9889 Other specified postprocedural states: Secondary | ICD-10-CM | POA: Diagnosis not present

## 2014-06-22 ENCOUNTER — Other Ambulatory Visit: Payer: Self-pay

## 2014-06-22 DIAGNOSIS — Z1231 Encounter for screening mammogram for malignant neoplasm of breast: Secondary | ICD-10-CM

## 2014-06-29 ENCOUNTER — Ambulatory Visit
Admission: RE | Admit: 2014-06-29 | Discharge: 2014-06-29 | Disposition: A | Discharge status: Home / Self Care | Payer: Medicare Other | Source: Ambulatory Visit

## 2014-06-29 DIAGNOSIS — Z1231 Encounter for screening mammogram for malignant neoplasm of breast: Secondary | ICD-10-CM | POA: Diagnosis not present

## 2014-07-05 DIAGNOSIS — H11159 Pinguecula, unspecified eye: Secondary | ICD-10-CM | POA: Diagnosis not present

## 2014-07-05 DIAGNOSIS — H18519 Endothelial corneal dystrophy, unspecified eye: Secondary | ICD-10-CM | POA: Diagnosis not present

## 2014-07-05 DIAGNOSIS — H04129 Dry eye syndrome of unspecified lacrimal gland: Secondary | ICD-10-CM | POA: Diagnosis not present

## 2014-07-05 DIAGNOSIS — H4011X0 Primary open-angle glaucoma, stage unspecified: Secondary | ICD-10-CM | POA: Diagnosis not present

## 2014-07-05 DIAGNOSIS — H02059 Trichiasis without entropian unspecified eye, unspecified eyelid: Secondary | ICD-10-CM | POA: Diagnosis not present

## 2014-07-05 DIAGNOSIS — H409 Unspecified glaucoma: Secondary | ICD-10-CM | POA: Diagnosis not present

## 2014-07-05 DIAGNOSIS — S058X9A Other injuries of unspecified eye and orbit, initial encounter: Secondary | ICD-10-CM | POA: Diagnosis not present

## 2014-07-19 DIAGNOSIS — Z23 Encounter for immunization: Secondary | ICD-10-CM | POA: Diagnosis not present

## 2014-07-26 DIAGNOSIS — E039 Hypothyroidism, unspecified: Secondary | ICD-10-CM | POA: Diagnosis not present

## 2014-10-10 DIAGNOSIS — J441 Chronic obstructive pulmonary disease with (acute) exacerbation: Secondary | ICD-10-CM | POA: Diagnosis not present

## 2014-10-10 DIAGNOSIS — J45909 Unspecified asthma, uncomplicated: Secondary | ICD-10-CM | POA: Diagnosis not present

## 2015-02-11 DIAGNOSIS — E039 Hypothyroidism, unspecified: Secondary | ICD-10-CM | POA: Diagnosis not present

## 2015-02-11 DIAGNOSIS — Z0000 Encounter for general adult medical examination without abnormal findings: Secondary | ICD-10-CM | POA: Diagnosis not present

## 2015-02-18 DIAGNOSIS — Z01419 Encounter for gynecological examination (general) (routine) without abnormal findings: Secondary | ICD-10-CM | POA: Diagnosis not present

## 2015-02-18 DIAGNOSIS — E780 Pure hypercholesterolemia: Secondary | ICD-10-CM | POA: Diagnosis not present

## 2015-02-18 DIAGNOSIS — E039 Hypothyroidism, unspecified: Secondary | ICD-10-CM | POA: Diagnosis not present

## 2015-02-18 DIAGNOSIS — Z1212 Encounter for screening for malignant neoplasm of rectum: Secondary | ICD-10-CM | POA: Diagnosis not present

## 2015-02-18 DIAGNOSIS — J449 Chronic obstructive pulmonary disease, unspecified: Secondary | ICD-10-CM | POA: Diagnosis not present

## 2015-03-04 DIAGNOSIS — R002 Palpitations: Secondary | ICD-10-CM | POA: Diagnosis not present

## 2015-03-05 DIAGNOSIS — R209 Unspecified disturbances of skin sensation: Secondary | ICD-10-CM | POA: Diagnosis not present

## 2015-03-05 DIAGNOSIS — R002 Palpitations: Secondary | ICD-10-CM | POA: Diagnosis not present

## 2015-03-05 DIAGNOSIS — R0989 Other specified symptoms and signs involving the circulatory and respiratory systems: Secondary | ICD-10-CM | POA: Diagnosis not present

## 2015-03-07 DIAGNOSIS — R002 Palpitations: Secondary | ICD-10-CM | POA: Diagnosis not present

## 2015-03-07 DIAGNOSIS — R209 Unspecified disturbances of skin sensation: Secondary | ICD-10-CM | POA: Diagnosis not present

## 2015-03-13 DIAGNOSIS — R0989 Other specified symptoms and signs involving the circulatory and respiratory systems: Secondary | ICD-10-CM | POA: Diagnosis not present

## 2015-03-18 DIAGNOSIS — R002 Palpitations: Secondary | ICD-10-CM | POA: Diagnosis not present

## 2015-03-18 DIAGNOSIS — R209 Unspecified disturbances of skin sensation: Secondary | ICD-10-CM | POA: Diagnosis not present

## 2015-03-18 DIAGNOSIS — R0989 Other specified symptoms and signs involving the circulatory and respiratory systems: Secondary | ICD-10-CM | POA: Diagnosis not present

## 2015-03-28 DIAGNOSIS — R002 Palpitations: Secondary | ICD-10-CM | POA: Diagnosis not present

## 2015-06-25 ENCOUNTER — Other Ambulatory Visit: Payer: Self-pay

## 2015-06-25 DIAGNOSIS — Z1231 Encounter for screening mammogram for malignant neoplasm of breast: Secondary | ICD-10-CM

## 2015-07-04 ENCOUNTER — Ambulatory Visit
Admission: RE | Admit: 2015-07-04 | Discharge: 2015-07-04 | Disposition: A | Discharge status: Home / Self Care | Payer: Medicare Other | Source: Ambulatory Visit

## 2015-07-04 DIAGNOSIS — Z1231 Encounter for screening mammogram for malignant neoplasm of breast: Secondary | ICD-10-CM | POA: Diagnosis not present

## 2015-07-11 DIAGNOSIS — L570 Actinic keratosis: Secondary | ICD-10-CM | POA: Diagnosis not present

## 2015-07-11 DIAGNOSIS — R300 Dysuria: Secondary | ICD-10-CM | POA: Diagnosis not present

## 2015-07-11 DIAGNOSIS — Z23 Encounter for immunization: Secondary | ICD-10-CM | POA: Diagnosis not present

## 2015-07-11 DIAGNOSIS — Z118 Encounter for screening for other infectious and parasitic diseases: Secondary | ICD-10-CM | POA: Diagnosis not present

## 2015-07-11 DIAGNOSIS — N390 Urinary tract infection, site not specified: Secondary | ICD-10-CM | POA: Diagnosis not present

## 2015-07-11 DIAGNOSIS — L821 Other seborrheic keratosis: Secondary | ICD-10-CM | POA: Diagnosis not present

## 2015-07-18 DIAGNOSIS — R197 Diarrhea, unspecified: Secondary | ICD-10-CM | POA: Diagnosis not present

## 2015-09-11 DIAGNOSIS — Z87891 Personal history of nicotine dependence: Secondary | ICD-10-CM | POA: Diagnosis not present

## 2015-09-11 DIAGNOSIS — I6782 Cerebral ischemia: Secondary | ICD-10-CM | POA: Diagnosis not present

## 2015-09-11 DIAGNOSIS — J449 Chronic obstructive pulmonary disease, unspecified: Secondary | ICD-10-CM | POA: Diagnosis not present

## 2015-09-11 DIAGNOSIS — G319 Degenerative disease of nervous system, unspecified: Secondary | ICD-10-CM | POA: Diagnosis not present

## 2015-09-11 DIAGNOSIS — R404 Transient alteration of awareness: Secondary | ICD-10-CM | POA: Diagnosis not present

## 2015-09-11 DIAGNOSIS — H53149 Visual discomfort, unspecified: Secondary | ICD-10-CM | POA: Diagnosis not present

## 2015-09-11 DIAGNOSIS — Z791 Long term (current) use of non-steroidal anti-inflammatories (NSAID): Secondary | ICD-10-CM | POA: Diagnosis not present

## 2015-09-11 DIAGNOSIS — H81392 Other peripheral vertigo, left ear: Secondary | ICD-10-CM | POA: Diagnosis not present

## 2015-09-11 DIAGNOSIS — R112 Nausea with vomiting, unspecified: Secondary | ICD-10-CM | POA: Diagnosis not present

## 2015-09-11 DIAGNOSIS — H81399 Other peripheral vertigo, unspecified ear: Secondary | ICD-10-CM | POA: Diagnosis not present

## 2015-09-11 DIAGNOSIS — R42 Dizziness and giddiness: Secondary | ICD-10-CM | POA: Diagnosis not present

## 2015-09-16 DIAGNOSIS — H9122 Sudden idiopathic hearing loss, left ear: Secondary | ICD-10-CM | POA: Diagnosis not present

## 2015-09-16 DIAGNOSIS — H8302 Labyrinthitis, left ear: Secondary | ICD-10-CM | POA: Diagnosis not present

## 2015-09-16 DIAGNOSIS — H903 Sensorineural hearing loss, bilateral: Secondary | ICD-10-CM | POA: Diagnosis not present

## 2015-10-03 DIAGNOSIS — H9042 Sensorineural hearing loss, unilateral, left ear, with unrestricted hearing on the contralateral side: Secondary | ICD-10-CM | POA: Diagnosis not present

## 2015-10-03 DIAGNOSIS — H9122 Sudden idiopathic hearing loss, left ear: Secondary | ICD-10-CM | POA: Diagnosis not present

## 2015-10-03 DIAGNOSIS — H8302 Labyrinthitis, left ear: Secondary | ICD-10-CM | POA: Diagnosis not present

## 2015-10-24 DIAGNOSIS — R05 Cough: Secondary | ICD-10-CM | POA: Diagnosis not present

## 2015-10-24 DIAGNOSIS — J449 Chronic obstructive pulmonary disease, unspecified: Secondary | ICD-10-CM | POA: Diagnosis not present

## 2015-10-24 DIAGNOSIS — J441 Chronic obstructive pulmonary disease with (acute) exacerbation: Secondary | ICD-10-CM | POA: Diagnosis not present

## 2015-11-26 DIAGNOSIS — R2689 Other abnormalities of gait and mobility: Secondary | ICD-10-CM | POA: Diagnosis not present

## 2015-11-26 DIAGNOSIS — Z87891 Personal history of nicotine dependence: Secondary | ICD-10-CM | POA: Diagnosis not present

## 2015-11-26 DIAGNOSIS — H9120 Sudden idiopathic hearing loss, unspecified ear: Secondary | ICD-10-CM | POA: Diagnosis not present

## 2015-12-19 DIAGNOSIS — H9120 Sudden idiopathic hearing loss, unspecified ear: Secondary | ICD-10-CM | POA: Diagnosis not present

## 2015-12-19 DIAGNOSIS — Z8673 Personal history of transient ischemic attack (TIA), and cerebral infarction without residual deficits: Secondary | ICD-10-CM | POA: Diagnosis not present

## 2016-01-13 DIAGNOSIS — J209 Acute bronchitis, unspecified: Secondary | ICD-10-CM | POA: Diagnosis not present

## 2016-01-16 DIAGNOSIS — Z8701 Personal history of pneumonia (recurrent): Secondary | ICD-10-CM | POA: Diagnosis not present

## 2016-01-16 DIAGNOSIS — J449 Chronic obstructive pulmonary disease, unspecified: Secondary | ICD-10-CM | POA: Diagnosis not present

## 2016-01-16 DIAGNOSIS — J45909 Unspecified asthma, uncomplicated: Secondary | ICD-10-CM | POA: Diagnosis not present

## 2016-01-16 DIAGNOSIS — R05 Cough: Secondary | ICD-10-CM | POA: Diagnosis not present

## 2016-01-20 DIAGNOSIS — R0600 Dyspnea, unspecified: Secondary | ICD-10-CM | POA: Diagnosis not present

## 2016-01-20 DIAGNOSIS — J3489 Other specified disorders of nose and nasal sinuses: Secondary | ICD-10-CM | POA: Diagnosis not present

## 2016-01-20 DIAGNOSIS — J45909 Unspecified asthma, uncomplicated: Secondary | ICD-10-CM | POA: Diagnosis not present

## 2016-03-19 DIAGNOSIS — T148 Other injury of unspecified body region: Secondary | ICD-10-CM | POA: Diagnosis not present

## 2016-04-22 DIAGNOSIS — R918 Other nonspecific abnormal finding of lung field: Secondary | ICD-10-CM | POA: Diagnosis not present

## 2016-04-22 DIAGNOSIS — J329 Chronic sinusitis, unspecified: Secondary | ICD-10-CM | POA: Diagnosis not present

## 2016-04-22 DIAGNOSIS — Z808 Family history of malignant neoplasm of other organs or systems: Secondary | ICD-10-CM | POA: Diagnosis not present

## 2016-04-22 DIAGNOSIS — J441 Chronic obstructive pulmonary disease with (acute) exacerbation: Secondary | ICD-10-CM | POA: Diagnosis not present

## 2016-04-22 DIAGNOSIS — R221 Localized swelling, mass and lump, neck: Secondary | ICD-10-CM | POA: Diagnosis not present

## 2016-04-22 DIAGNOSIS — E039 Hypothyroidism, unspecified: Secondary | ICD-10-CM | POA: Diagnosis not present

## 2016-04-22 DIAGNOSIS — R1310 Dysphagia, unspecified: Secondary | ICD-10-CM | POA: Diagnosis not present

## 2016-04-23 ENCOUNTER — Other Ambulatory Visit: Payer: Self-pay | Admitting: Internal Medicine

## 2016-04-23 DIAGNOSIS — Z808 Family history of malignant neoplasm of other organs or systems: Secondary | ICD-10-CM

## 2016-04-24 ENCOUNTER — Other Ambulatory Visit: Payer: Self-pay | Admitting: Internal Medicine

## 2016-04-24 DIAGNOSIS — R9389 Abnormal findings on diagnostic imaging of other specified body structures: Secondary | ICD-10-CM

## 2016-04-28 ENCOUNTER — Ambulatory Visit
Admission: RE | Admit: 2016-04-28 | Discharge: 2016-04-28 | Disposition: A | Discharge status: Home / Self Care | Payer: Medicare Other | Source: Ambulatory Visit | Attending: Internal Medicine | Admitting: Internal Medicine

## 2016-04-28 DIAGNOSIS — Z808 Family history of malignant neoplasm of other organs or systems: Secondary | ICD-10-CM

## 2016-04-28 DIAGNOSIS — R1310 Dysphagia, unspecified: Secondary | ICD-10-CM | POA: Diagnosis not present

## 2016-04-30 ENCOUNTER — Ambulatory Visit
Admission: RE | Admit: 2016-04-30 | Discharge: 2016-04-30 | Disposition: A | Discharge status: Home / Self Care | Payer: PRIVATE HEALTH INSURANCE | Source: Ambulatory Visit | Attending: Internal Medicine | Admitting: Internal Medicine

## 2016-04-30 DIAGNOSIS — R911 Solitary pulmonary nodule: Secondary | ICD-10-CM | POA: Diagnosis not present

## 2016-04-30 DIAGNOSIS — R9389 Abnormal findings on diagnostic imaging of other specified body structures: Secondary | ICD-10-CM

## 2016-05-04 DIAGNOSIS — R222 Localized swelling, mass and lump, trunk: Secondary | ICD-10-CM | POA: Diagnosis not present

## 2016-05-04 DIAGNOSIS — J9801 Acute bronchospasm: Secondary | ICD-10-CM | POA: Diagnosis not present

## 2016-05-04 DIAGNOSIS — J45909 Unspecified asthma, uncomplicated: Secondary | ICD-10-CM | POA: Diagnosis not present

## 2016-05-04 DIAGNOSIS — J441 Chronic obstructive pulmonary disease with (acute) exacerbation: Secondary | ICD-10-CM | POA: Diagnosis not present

## 2016-05-06 ENCOUNTER — Other Ambulatory Visit (HOSPITAL_COMMUNITY): Payer: Self-pay | Admitting: Internal Medicine

## 2016-05-06 DIAGNOSIS — J9801 Acute bronchospasm: Secondary | ICD-10-CM | POA: Diagnosis not present

## 2016-05-06 DIAGNOSIS — R918 Other nonspecific abnormal finding of lung field: Secondary | ICD-10-CM

## 2016-05-06 DIAGNOSIS — R222 Localized swelling, mass and lump, trunk: Secondary | ICD-10-CM | POA: Diagnosis not present

## 2016-05-06 DIAGNOSIS — J45909 Unspecified asthma, uncomplicated: Secondary | ICD-10-CM

## 2016-05-06 DIAGNOSIS — J449 Chronic obstructive pulmonary disease, unspecified: Secondary | ICD-10-CM

## 2016-05-06 DIAGNOSIS — J441 Chronic obstructive pulmonary disease with (acute) exacerbation: Secondary | ICD-10-CM | POA: Diagnosis not present

## 2016-05-11 ENCOUNTER — Encounter (HOSPITAL_COMMUNITY)
Admission: RE | Admit: 2016-05-11 | Discharge: 2016-05-11 | Disposition: A | Discharge status: Home / Self Care | Payer: Medicare Other | Source: Ambulatory Visit | Attending: Internal Medicine | Admitting: Internal Medicine

## 2016-05-11 ENCOUNTER — Other Ambulatory Visit (HOSPITAL_COMMUNITY): Payer: Self-pay | Admitting: Internal Medicine

## 2016-05-11 DIAGNOSIS — R911 Solitary pulmonary nodule: Secondary | ICD-10-CM | POA: Diagnosis not present

## 2016-05-11 DIAGNOSIS — J449 Chronic obstructive pulmonary disease, unspecified: Secondary | ICD-10-CM | POA: Diagnosis not present

## 2016-05-11 DIAGNOSIS — R918 Other nonspecific abnormal finding of lung field: Secondary | ICD-10-CM | POA: Diagnosis not present

## 2016-05-11 DIAGNOSIS — J45909 Unspecified asthma, uncomplicated: Secondary | ICD-10-CM | POA: Insufficient documentation

## 2016-05-11 LAB — GLUCOSE, CAPILLARY: Glucose-Capillary: 82 mg/dL (ref 65–99)

## 2016-05-11 MED ORDER — FLUDEOXYGLUCOSE F - 18 (FDG) INJECTION
7.8000 | Freq: Once | INTRAVENOUS | Status: AC | PRN
  Administered 2016-05-11: 7.8 via INTRAVENOUS

## 2016-05-26 DIAGNOSIS — M25811 Other specified joint disorders, right shoulder: Secondary | ICD-10-CM | POA: Diagnosis not present

## 2016-06-10 ENCOUNTER — Ambulatory Visit (INDEPENDENT_AMBULATORY_CARE_PROVIDER_SITE_OTHER)
Admission: RE | Admit: 2016-06-10 | Discharge: 2016-06-10 | Disposition: A | Discharge status: Home / Self Care | Payer: Medicare Other | Source: Ambulatory Visit | Attending: Internal Medicine | Admitting: Internal Medicine

## 2016-06-10 ENCOUNTER — Other Ambulatory Visit (INDEPENDENT_AMBULATORY_CARE_PROVIDER_SITE_OTHER): Payer: Medicare Other

## 2016-06-10 ENCOUNTER — Ambulatory Visit (INDEPENDENT_AMBULATORY_CARE_PROVIDER_SITE_OTHER): Payer: Medicare Other | Admitting: Internal Medicine

## 2016-06-10 ENCOUNTER — Encounter: Payer: Self-pay | Admitting: Internal Medicine

## 2016-06-10 VITALS — BP 102/62 | HR 65 | Ht 62.0 in | Wt 164.0 lb

## 2016-06-10 DIAGNOSIS — R059 Cough, unspecified: Secondary | ICD-10-CM

## 2016-06-10 DIAGNOSIS — R0600 Dyspnea, unspecified: Secondary | ICD-10-CM

## 2016-06-10 DIAGNOSIS — R9389 Abnormal findings on diagnostic imaging of other specified body structures: Secondary | ICD-10-CM

## 2016-06-10 DIAGNOSIS — R05 Cough: Secondary | ICD-10-CM | POA: Insufficient documentation

## 2016-06-10 DIAGNOSIS — R0602 Shortness of breath: Secondary | ICD-10-CM | POA: Diagnosis not present

## 2016-06-10 DIAGNOSIS — R938 Abnormal findings on diagnostic imaging of other specified body structures: Secondary | ICD-10-CM | POA: Diagnosis not present

## 2016-06-10 DIAGNOSIS — J449 Chronic obstructive pulmonary disease, unspecified: Secondary | ICD-10-CM | POA: Diagnosis not present

## 2016-06-10 DIAGNOSIS — R0609 Other forms of dyspnea: Secondary | ICD-10-CM | POA: Insufficient documentation

## 2016-06-10 LAB — SEDIMENTATION RATE: Sed Rate: 9 mm/hr (ref 0–30)

## 2016-06-10 LAB — BASIC METABOLIC PANEL
BUN: 17 mg/dL (ref 6–23)
CHLORIDE: 100 meq/L (ref 96–112)
CO2: 25 meq/L (ref 19–32)
CREATININE: 0.71 mg/dL (ref 0.40–1.20)
Calcium: 9.1 mg/dL (ref 8.4–10.5)
GFR: 84.64 mL/min (ref >60.00)
Glucose, Bld: 91 mg/dL (ref 70–99)
POTASSIUM: 4.1 meq/L (ref 3.5–5.1)
SODIUM: 133 meq/L — AB (ref 135–145)

## 2016-06-10 LAB — CBC WITH DIFFERENTIAL/PLATELET
BASOS PCT: 0.7 % (ref 0.0–3.0)
Basophils Absolute: 0.1 10*3/uL (ref 0.0–0.1)
EOS PCT: 1 % (ref 0.0–5.0)
Eosinophils Absolute: 0.1 10*3/uL (ref 0.0–0.7)
HEMATOCRIT: 40.9 % (ref 36.0–46.0)
HEMOGLOBIN: 13.8 g/dL (ref 12.0–15.0)
LYMPHS PCT: 22.4 % (ref 12.0–46.0)
Lymphs Abs: 1.9 10*3/uL (ref 0.7–4.0)
MCHC: 33.8 g/dL (ref 30.0–36.0)
MCV: 85.8 fl (ref 78.0–100.0)
MONOS PCT: 10.2 % (ref 3.0–12.0)
Monocytes Absolute: 0.9 10*3/uL (ref 0.1–1.0)
Neutro Abs: 5.6 10*3/uL (ref 1.4–7.7)
Neutrophils Relative %: 65.7 % (ref 43.0–77.0)
Platelets: 167 10*3/uL (ref 150.0–400.0)
RBC: 4.76 Mil/uL (ref 3.87–5.11)
RDW: 14.4 % (ref 11.5–15.5)
WBC: 8.6 10*3/uL (ref 4.0–10.5)

## 2016-06-10 LAB — TSH: TSH: 0.49 u[IU]/mL (ref 0.35–4.50)

## 2016-06-10 LAB — BRAIN NATRIURETIC PEPTIDE: PRO B NATRI PEPTIDE: 75 pg/mL (ref 0.0–100.0)

## 2016-06-10 NOTE — Patient Instructions (Signed)
Please see patient coordinator before you leave today  to schedule sinus CT  Please remember to go to the lab and x-ray department downstairs for your tests - we will call you with the results when they are available.    GERD (REFLUX)  is an extremely common cause of respiratory symptoms just like yours , many times with no obvious heartburn at all.    It can be treated with medication, but also with lifestyle changes including elevation of the head of your bed (ideally with 6 inch  bed blocks),  Smoking cessation, avoidance of late meals, excessive alcohol, and avoid fatty foods, chocolate, peppermint, colas, red wine, and acidic juices such as orange juice.  NO MINT OR MENTHOL PRODUCTS SO NO COUGH DROPS   USE SUGARLESS CANDY INSTEAD (Jolley ranchers or Stover's or Life Savers) or even ice chips will also do - the key is to swallow to prevent all throat clearing. NO OIL BASED VITAMINS - use powdered substitutes - stop fish oil for now and eat more fish   Please schedule a follow up office visit in 4 weeks, sooner if needed with pfts at [facility] long right before the visit same day

## 2016-06-10 NOTE — Progress Notes (Signed)
Subjective:     Patient ID: Sharon Mcdaniel, female   DOB: 1936/09/09,     MRN: [id number]  HPI  21 yowf quit smoking in 2005 with severe CAP dx as GOLD II copd Sept 2007 and referred to pulmonary clinic 06/10/2016 by Dr Kim/ Thedora Hinders for abn CT chest    06/10/2016 1st [facility] office visit/ Sharon Mcdaniel   Chief Complaint  Patient presents with  . Pulmonary Consult    Pt referred by Dr Epimenio Sarin for COPD/lung mass. Recent PET scan 05/11/16.   onset of new pattern in Dec 2016 with severe cough that comes and goes and a persistent sense of doe that persisteed sine them not back to baseline esp  Worse in  June 2017 but improved p abx Doe = MMRC1 = can walk nl pace, flat grade, can't hurry or go uphills or steps s sob   Since onset of recurrent cough has had unresolved nasal congestion and intermittent purulent nasal drainage correlating with flares of "bronchitis'   pred / abx def help cough but keeps coming back sev weeks later   No obvious patterns in  day to day or daytime variability or assoc   mucus plugs or hemoptysis or cp or chest tightness, subjective wheeze or overt   hb symptoms. No unusual exp hx or h/o childhood pna/ asthma or knowledge of premature birth.  Sleeping ok without nocturnal  or early am exacerbation  of respiratory  c/o's or need for noct saba. Also denies any obvious fluctuation of symptoms with weather or environmental changes or other aggravating or alleviating factors except as outlined above   Current Medications, Allergies, Complete Past Medical History, Past Surgical History, Family History, and Social History were reviewed in [employer] record.  ROS  The following are not active complaints unless bolded sore throat, dysphagia, dental problems, itching, sneezing,  nasal congestion or excess/ purulent secretions, ear ache,   fever, chills, sweats, unintended wt loss, classically pleuritic or exertional cp,  orthopnea pnd or leg  swelling, presyncope, palpitations, abdominal pain, anorexia, nausea, vomiting, diarrhea  or change in bowel or bladder habits, change in stools or urine, dysuria,hematuria,  rash, arthralgias, visual complaints, headache, numbness, weakness or ataxia or problems with walking or coordination,  change in mood/affect or memory.         Review of Systems     Objective:   Physical Exam   amb wf nad   Wt Readings from Last 3 Encounters:  06/10/16 164 lb (74.4 kg)  11/03/11 145 lb 8 oz (66 kg)    Vital signs reviewed     HEENT: full dentures/ nl  turbinates, and oropharynx. Nl external ear canals without cough reflex   NECK :  without JVD/Nodes/TM/ nl carotid upstrokes bilaterally   LUNGS: no acc muscle use,  Nl contour chest which is clear to A and P bilaterally without cough on insp or exp maneuvers   CV:  RRR  no s3 or murmur or increase in P2, no edema   ABD:  soft and nontender with nl inspiratory excursion in the supine position. No bruits or organomegaly, bowel sounds nl  MS:  Nl gait/ ext warm without deformities, calf tenderness, cyanosis or clubbing No obvious joint restrictions   SKIN: warm and dry without lesions    NEURO:  alert, approp, nl sensorium with  no motor deficits    CXR PA and Lateral:   06/10/2016 :    I personally reviewed  images and agree with radiology impression as follows:    Stable nodular densities in the right upper lobe. My impression: no as dz or ILD   Labs ordered/ reviewed:     Chemistry      Component Value Date/Time   NA 133 (L) 06/10/2016 1712   K 4.1 06/10/2016 1712   CL 100 06/10/2016 1712   CO2 25 06/10/2016 1712   BUN 17 06/10/2016 1712   CREATININE 0.71 06/10/2016 1712      Component Value Date/Time   CALCIUM 9.1 06/10/2016 1712   ALKPHOS 88 11/03/2011 1300   AST 25 11/03/2011 1300   ALT 24 11/03/2011 1300   BILITOT 0.2 (L) 11/03/2011 1300        Lab Results  Component Value Date   WBC 8.6 06/10/2016   HGB  13.8 06/10/2016   HCT 40.9 06/10/2016   MCV 85.8 06/10/2016   PLT 167.0 06/10/2016         Lab Results  Component Value Date   TSH 0.49 06/10/2016     Lab Results  Component Value Date   PROBNP 75.0 06/10/2016       Lab Results  Component Value Date   ESRSEDRATE 9 06/10/2016           Assessment:

## 2016-06-11 ENCOUNTER — Telehealth: Payer: Self-pay | Admitting: Internal Medicine

## 2016-06-11 DIAGNOSIS — R9389 Abnormal findings on diagnostic imaging of other specified body structures: Secondary | ICD-10-CM | POA: Insufficient documentation

## 2016-06-11 DIAGNOSIS — R05 Cough: Secondary | ICD-10-CM

## 2016-06-11 DIAGNOSIS — J449 Chronic obstructive pulmonary disease, unspecified: Secondary | ICD-10-CM

## 2016-06-11 DIAGNOSIS — R059 Cough, unspecified: Secondary | ICD-10-CM

## 2016-06-11 NOTE — Assessment & Plan Note (Signed)
06/10/2016  Walked RA x 3 laps @ 185 ft each stopped due to  End of study, nl pace, no sob or desat   Symptoms are  disproportionate to objective findings and not clear this is all  a lung problem but pt does appear to have difficult airway management issues. DDX of  difficult airways management almost all start with A and  include Adherence, Ace Inhibitors, Acid Reflux, Active Sinus Disease, Alpha 1 Antitripsin deficiency, Anxiety masquerading as Airways dz,  ABPA,  Allergy(esp in young), Aspiration (esp in elderly), Adverse effects of meds,  Active smokers, A bunch of PE's (a small clot burden can't cause this syndrome unless there is already severe underlying pulm or vascular dz with poor reserve) plus two Bs  = Bronchiectasis and Beta blocker use..and one C= CHF  Adherence is always the initial "prime suspect" and is a multilayered concern that requires a "trust but verify" approach in every patient - starting with knowing how to use medications, especially inhalers, correctly, keeping up with refills and understanding the fundamental difference between maintenance and prns vs those medications only taken for a very short course and then stopped and not refilled.   ? Acid (or non-acid) GERD > always difficult to exclude as up to 75% of pts in some series report no assoc GI/ Heartburn symptoms> rec  diet restrictions/ reviewed and instructions given in writing - hold off meds for now   ? Active sinus dz > check sinus CT   ? chf > excluded by cxr and hx and bnp << 100    Will bring pt back for full pfts and go from there  Total time devoted to counseling  = 35/38m review case with pt/ discussion of options/alternatives/ personally creating written instructions  in presence of pt  then going over those specific  Instructions directly with the pt including how to use all of the meds but in particular covering each new medication in detail and the difference between the maintenance/automatic meds and the  prns using an action plan format for the latter.    ? Allergy/ asthma  >  Allergy profile

## 2016-06-11 NOTE — Telephone Encounter (Signed)
PFT order has been placed.

## 2016-06-11 NOTE — Assessment & Plan Note (Signed)
PFTs July 06, 2006    FEV1 of 58% predicted with a ratio of 56%  And 15% improvement after bronchodilator consistent with an asthmatic component.   When respiratory symptoms begin or become refractory well after a patient reports complete smoking cessation,  Especially when this wasn't the case while they were smoking, a red flag is raised based on the work of Dr Kris Mouton which states:  if you quit smoking when your best day FEV1 is still well preserved it is highly unlikely you will progress to severe disease.  That is to say, once the smoking stops,  the symptoms should not suddenly erupt or markedly worsen.  If so, the differential diagnosis should include  obesity/deconditioning,  LPR/Reflux/Aspiration syndromes,  occult CHF, or  especially side effect of medications commonly used in this population.   For now hold off on rx and repeat pfts and then regroup

## 2016-06-11 NOTE — Assessment & Plan Note (Signed)
Allergy profile 06/10/2016 >  Eos 0. /  IgE   - Sinus CT 06/10/2016 >>>   Most likely this is  Classic Upper airway cough syndrome, so named because it's frequently impossible to sort out how much is  CR/sinusitis with freq throat clearing (which can be related to primary GERD)   vs  causing  secondary (" extra esophageal")  GERD from wide swings in gastric pressure that occur with throat clearing, often  promoting self use of mint and menthol lozenges that reduce the lower esophageal sphincter tone and exacerbate the problem further in a cyclical fashion.   These are the same pts (now being labeled as having "irritable larynx syndrome" by some cough centers) who not infrequently have a history of having failed to tolerate ace inhibitors,  dry powder inhalers or biphosphonates or report having atypical reflux symptoms that don't respond to standard doses of PPI , and are easily confused as having aecopd or asthma flares by even experienced allergists/ pulmonologists.   For now rx for gerd with diet/ explore sinus CT next

## 2016-06-11 NOTE — Telephone Encounter (Signed)
I scheduled pft & ov for pt.  I spoke to her & gave her appt info for CT, pft & ov.  Nothing further needed.

## 2016-06-11 NOTE — Telephone Encounter (Signed)
We need an Order placed for the PFTs so we can  schedule those same day as ROV in 4 wks.

## 2016-06-11 NOTE — Assessment & Plan Note (Signed)
See CT chest 04/30/16 and PET 05/11/16 Right upper lobe subpleural nodular density shows absence of metabolic activity, suggesting benign etiology.  Bilateral multi lobar areas of airspace disease and reticulonodular opacities, which show metabolic activity and are new since recent CT, most consistent with acute infectious or inflammatory etiology. - chest X-ray 06/10/2016  No as dz p rx with abx/pred  With nl exam/improved symptoms and esr so low this was most likely inflammatory as dz and no need to f/u the nodule as per PET so will focus on symptoms of cough/ sob first and repeat CT chest at a later date but do HRCT if still issue of relation to cough/sob as this would be better study for occult  ILD/ bronchiectasis not seen on plain cxr or ct

## 2016-06-11 NOTE — Progress Notes (Signed)
Spoke with pt and notified of results per Dr. Wert. Pt verbalized understanding and denied any questions. 

## 2016-06-15 ENCOUNTER — Ambulatory Visit (INDEPENDENT_AMBULATORY_CARE_PROVIDER_SITE_OTHER)
Admission: RE | Admit: 2016-06-15 | Discharge: 2016-06-15 | Disposition: A | Discharge status: Home / Self Care | Payer: Medicare Other | Source: Ambulatory Visit | Attending: Internal Medicine | Admitting: Internal Medicine

## 2016-06-15 DIAGNOSIS — R05 Cough: Secondary | ICD-10-CM | POA: Diagnosis not present

## 2016-06-15 DIAGNOSIS — R059 Cough, unspecified: Secondary | ICD-10-CM

## 2016-06-15 DIAGNOSIS — J342 Deviated nasal septum: Secondary | ICD-10-CM | POA: Diagnosis not present

## 2016-06-16 LAB — RESPIRATORY ALLERGY PROFILE REGION II ~~LOC~~
Allergen, C. Herbarum, M2: <0.1 kU/L
Allergen, Cedar tree, t12: <0.1 kU/L
Allergen, Comm Silver Birch, t9: <0.1 kU/L
Allergen, Cottonwood, t14: <0.1 kU/L
Allergen, Mouse Urine Protein, e78: <0.1 kU/L
Bermuda Grass: <0.1 kU/L
Dog Dander: <0.1 kU/L
Elm IgE: <0.1 kU/L
IgE (Immunoglobulin E), Serum: 7 kU/L (ref <115)

## 2016-07-03 ENCOUNTER — Other Ambulatory Visit: Payer: Self-pay

## 2016-07-08 ENCOUNTER — Ambulatory Visit (HOSPITAL_COMMUNITY)
Admission: RE | Admit: 2016-07-08 | Discharge: 2016-07-08 | Disposition: A | Discharge status: Home / Self Care | Payer: Medicare Other | Source: Ambulatory Visit | Attending: Internal Medicine | Admitting: Internal Medicine

## 2016-07-08 ENCOUNTER — Ambulatory Visit (INDEPENDENT_AMBULATORY_CARE_PROVIDER_SITE_OTHER): Payer: Medicare Other | Admitting: Internal Medicine

## 2016-07-08 ENCOUNTER — Encounter: Payer: Self-pay | Admitting: Internal Medicine

## 2016-07-08 ENCOUNTER — Telehealth: Payer: Self-pay | Admitting: Pulmonary Disease

## 2016-07-08 VITALS — BP 110/80 | HR 71 | Temp 97.5°F | Ht 62.0 in | Wt 161.8 lb

## 2016-07-08 DIAGNOSIS — R05 Cough: Secondary | ICD-10-CM | POA: Insufficient documentation

## 2016-07-08 DIAGNOSIS — J45909 Unspecified asthma, uncomplicated: Secondary | ICD-10-CM

## 2016-07-08 DIAGNOSIS — J449 Chronic obstructive pulmonary disease, unspecified: Secondary | ICD-10-CM | POA: Diagnosis not present

## 2016-07-08 DIAGNOSIS — R059 Cough, unspecified: Secondary | ICD-10-CM

## 2016-07-08 LAB — PULMONARY FUNCTION TEST
DL/VA % PRED: 71 %
DL/VA: 3.24 ml/min/mmHg/L
DLCO unc % pred: 52 %
DLCO unc: 11.23 ml/min/mmHg
FEF 25-75 POST: 1.19 L/s
FEF 25-75 Pre: 0.82 L/sec
FEF2575-%Change-Post: 44 %
FEF2575-%PRED-POST: 84 %
FEF2575-%Pred-Pre: 58 %
FEV1-%Change-Post: 10 %
FEV1-%PRED-PRE: 72 %
FEV1-%Pred-Post: 79 %
FEV1-PRE: 1.32 L
FEV1-Post: 1.45 L
FEV1FVC-%CHANGE-POST: 5 %
FEV1FVC-%PRED-PRE: 94 %
FEV6-%Change-Post: 4 %
FEV6-%Pred-Post: 83 %
FEV6-%Pred-Pre: 80 %
FEV6-Post: 1.95 L
FEV6-Pre: 1.87 L
FEV6FVC-%Pred-Post: 105 %
FEV6FVC-%Pred-Pre: 105 %
FVC-%Change-Post: 4 %
FVC-%PRED-POST: 79 %
FVC-%PRED-PRE: 76 %
FVC-POST: 1.96 L
FVC-PRE: 1.87 L
POST FEV1/FVC RATIO: 74 %
PRE FEV1/FVC RATIO: 70 %
Post FEV6/FVC ratio: 100 %
Pre FEV6/FVC Ratio: 100 %
RV % pred: 112 %
RV: 2.53 L
TLC % pred: 91 %
TLC: 4.37 L

## 2016-07-08 MED ORDER — AZITHROMYCIN 250 MG PO TABS: 250.0000 mg | ORAL_TABLET | Freq: Every day | ORAL | 2 refills | Status: DC

## 2016-07-08 MED ORDER — BUDESONIDE 0.25 MG/2ML IN SUSP: RESPIRATORY_TRACT | Status: DC

## 2016-07-08 MED ORDER — FAMOTIDINE 20 MG PO TABS: ORAL_TABLET | ORAL | 2 refills | Status: DC

## 2016-07-08 MED ORDER — ALBUTEROL SULFATE (2.5 MG/3ML) 0.083% IN NEBU
2.5000 mg | INHALATION_SOLUTION | Freq: Once | RESPIRATORY_TRACT | Status: AC
  Administered 2016-07-08: 2.5 mg via RESPIRATORY_TRACT

## 2016-07-08 MED ORDER — PANTOPRAZOLE SODIUM 40 MG PO TBEC: 40.0000 mg | DELAYED_RELEASE_TABLET | Freq: Every day | ORAL | 2 refills | Status: DC

## 2016-07-08 NOTE — Telephone Encounter (Signed)
Called by [facility] d/t confusion with prescription for Azithromycin. Instructions for standard Z pack. 250 mg, 2 tabs day one, then 1 tab for next for days. However, Disp: #30 written. I told pharmacist to dispense a standard Z pack and the patient or pharmacy could clarify what Dr. Melvyn Novas desired with Dr. Melvyn Novas himself in the AM.

## 2016-07-08 NOTE — Progress Notes (Signed)
Subjective:     Patient ID: Sharon Mcdaniel, female   DOB: [birth year]      MRN: [id number]  Brief patient profile:   42 yowf quit smoking in 2005 with severe CAP dx as GOLD II copd Sept 2007 and referred to pulmonary clinic 06/10/2016 by Dr Kim/ Thedora Hinders for abn CT chest    History of Present Illness  06/10/2016 1st [facility] office visit/ Wert   Chief Complaint  Patient presents with  . Pulmonary Consult    Pt referred by Dr Epimenio Sarin for COPD/lung mass. Recent PET scan 05/11/16.   onset of new pattern in Dec 2016 with severe cough that comes and goes and a persistent sense of doe that persisteed since them not back to baseline esp Worse in  June 2017 but improved p abx Doe = MMRC1 = can walk nl pace, flat grade, can't hurry or go uphills or steps s sob Since onset of recurrent cough has had unresolved nasal congestion and intermittent purulent nasal drainage correlating with flares of "bronchitis' pred / abx def help cough but keeps coming back sev weeks later  rec Please see patient coordinator before you leave today  to schedule sinus CT Please remember to go to the lab and x-ray department downstairs for your tests - we will call you with the results when they are available.  GERD  Diet   07/08/2016  f/u ov/Wert re:AB/ does not meet criteria for copd ? Bronchiectatic ? Chief Complaint  Patient presents with  . Follow-up    PFT done today. She woke up with stuffy head and rhinits. She denies any changes in her breathing.   typical scenario is nasty nasal drainage gradually settles  into chest over a week then better p abx and pred x multiple episodes since Oct 2016  In between flares feels fine maintained on albuterol/ ipratropium/ bud twice daily and Not limited by breathing from desired activities    No obvious patterns in  day to day or daytime variability or assoc   mucus plugs or hemoptysis or cp or chest tightness, subjective wheeze or overt   hb symptoms. No unusual  exp hx or h/o childhood pna/ asthma or knowledge of premature birth.  Sleeping ok without nocturnal  or early am exacerbation  of respiratory  c/o's or need for noct saba. Also denies any obvious fluctuation of symptoms with weather or environmental changes or other aggravating or alleviating factors except as outlined above   Current Medications, Allergies, Complete Past Medical History, Past Surgical History, Family History, and Social History were reviewed in [employer] record.  ROS  The following are not active complaints unless bolded sore throat, dysphagia, dental problems, itching, sneezing,  nasal congestion or excess/ purulent secretions, ear ache,   fever, chills, sweats, unintended wt loss, classically pleuritic or exertional cp,  orthopnea pnd or leg swelling, presyncope, palpitations, abdominal pain, anorexia, nausea, vomiting, diarrhea  or change in bowel or bladder habits, change in stools or urine, dysuria,hematuria,  rash, arthralgias, visual complaints, headache, numbness, weakness or ataxia or problems with walking or coordination,  change in mood/affect or memory.           Objective:   Physical Exam   amb wf nad  Wt Readings from Last 3 Encounters:  07/08/16 161 lb 12.8 oz (73.4 kg)  06/10/16 164 lb (74.4 kg)  11/03/11 145 lb 8 oz (66 kg)    Vital signs reviewed  HEENT: full dentures/ nl   and oropharynx. Nl external ear canals without cough reflex- moderate bilateral non-specific turbinate edema     NECK :  without JVD/Nodes/TM/ nl carotid upstrokes bilaterally   LUNGS: no acc muscle use,  Nl contour chest which is clear to A and P bilaterally without cough on insp or exp maneuvers   CV:  RRR  no s3 or murmur or increase in P2, no edema   ABD:  soft and nontender with nl inspiratory excursion in the supine position. No bruits or organomegaly, bowel sounds nl  MS:  Nl gait/ ext warm without deformities, calf tenderness, cyanosis  or clubbing No obvious joint restrictions   SKIN: warm and dry without lesions    NEURO:  alert, approp, nl sensorium with  no motor deficits    CXR PA and Lateral:   06/10/2016 :    I personally reviewed images and agree with radiology impression as follows:    Stable nodular densities in the right upper lobe. My impression: no as dz or ILD   Labs ordered/ reviewed:     Chemistry      Component Value Date/Time   NA 133 (L) 06/10/2016 1712   K 4.1 06/10/2016 1712   CL 100 06/10/2016 1712   CO2 25 06/10/2016 1712   BUN 17 06/10/2016 1712   CREATININE 0.71 06/10/2016 1712      Component Value Date/Time   CALCIUM 9.1 06/10/2016 1712   ALKPHOS 88 11/03/2011 1300   AST 25 11/03/2011 1300   ALT 24 11/03/2011 1300   BILITOT 0.2 (L) 11/03/2011 1300        Lab Results  Component Value Date   WBC 8.6 06/10/2016   HGB 13.8 06/10/2016   HCT 40.9 06/10/2016   MCV 85.8 06/10/2016   PLT 167.0 06/10/2016         Lab Results  Component Value Date   TSH 0.49 06/10/2016     Lab Results  Component Value Date   PROBNP 75.0 06/10/2016       Lab Results  Component Value Date   ESRSEDRATE 9 06/10/2016           Assessment:

## 2016-07-08 NOTE — Patient Instructions (Addendum)
zithromax 250 mg daily   Pantoprazole (protonix) 40 mg   Take  30-60 min before first meal of the day and Pepcid (famotidine)  20 mg one @  bedtime until return to office - this is the best way to tell whether stomach acid is contributing to your problem.    Only take the am dose of budesonide/albuterol and ipaptropium then the other doses at lunch / supper / bedtime are just the albuterol/ipatropium as needed for cough or wheeze or short of breath  For cough use your flutter valve as much as possible   Please schedule a follow up office visit in 4 weeks, sooner if needed with HRCT of chest

## 2016-07-09 ENCOUNTER — Other Ambulatory Visit: Payer: Self-pay | Admitting: Internal Medicine

## 2016-07-09 DIAGNOSIS — R059 Cough, unspecified: Secondary | ICD-10-CM

## 2016-07-09 DIAGNOSIS — R05 Cough: Secondary | ICD-10-CM

## 2016-07-09 NOTE — Assessment & Plan Note (Signed)
PFTs July 06, 2006    FEV1 of 58% predicted with a ratio of 56%  And 15% improvement after bronchodilator consistent with an asthmatic component. - PFT's  07/08/2016  FEV1 1.45 (79 % ) ratio 74  p 10 % improvement from saba p duoneb > 6 h  prior to study with DLCO  52 % corrects to 71  % for alv volume    So on her good days she improves to nl but having flares typical of sinus dz or bronchiectasis - since we've eliminated the former but not the latter and she continues to flare on high dose ICS/ rec lower the dose of ics to once daily and add zmax daily x one month then return here for HRCT Chest  I had an extended discussion with the patient reviewing all relevant studies completed to date and  lasting 15 to 20 minutes of a 25 minute visit    Each maintenance medication was reviewed in detail including most importantly the difference between maintenance and prns and under what circumstances the prns are to be triggered using an action plan format that is not reflected in the computer generated alphabetically organized AVS.    Please see instructions for details which were reviewed in writing and the patient given a copy highlighting the part that I personally wrote and discussed at today's ov.

## 2016-07-09 NOTE — Assessment & Plan Note (Signed)
Allergy profile 06/10/2016 >  Eos 0.1/  IgE 7 RAST neg  - Sinus CT 06/15/2016 > neg     Encouraged to use flutter she already possesses and mucinex dm prn

## 2016-07-09 NOTE — Telephone Encounter (Signed)
rx is as per instructions: take one daily #30

## 2016-07-10 ENCOUNTER — Telehealth: Payer: Self-pay | Admitting: Internal Medicine

## 2016-07-10 NOTE — Telephone Encounter (Signed)
zithromax 250 mg daily   Pantoprazole (protonix) 40 mg   Take  30-60 min before first meal of the day and Pepcid (famotidine)  20 mg one @  bedtime until return to office - this is the best way to tell whether stomach acid is contributing to your problem.   Only take the am dose of budesonide/albuterol and ipaptropium then the other doses at lunch / supper / bedtime are just the albuterol/ipatropium as needed for cough or wheeze or short of breath For cough use your flutter valve as much as possible  Please schedule a follow up office visit in 4 weeks, sooner if needed with HRCT of chest  --  [facility] and clarified Rx for [facility]ithromax. Nothing further needed

## 2016-07-22 ENCOUNTER — Encounter: Payer: Self-pay | Admitting: Cardiovascular Disease

## 2016-07-27 ENCOUNTER — Telehealth: Payer: Self-pay | Admitting: Internal Medicine

## 2016-07-27 NOTE — Telephone Encounter (Signed)
Spoke with pt. States at her last OV, MW prescribed azithromycin to take daily. This is causing her diarrhea. She would like another medication.  MW - please advise. Thanks.

## 2016-07-27 NOTE — Telephone Encounter (Signed)
Called and spoke with pt and she is aware of MW recs.  Pt voiced her understanding and nothing further is needed.

## 2016-07-27 NOTE — Telephone Encounter (Signed)
Since we don't know for sure she needs abx, leave to leave off them off for now entirely as there aren't any great options

## 2016-08-05 ENCOUNTER — Ambulatory Visit (INDEPENDENT_AMBULATORY_CARE_PROVIDER_SITE_OTHER): Payer: Medicare Other | Admitting: Cardiovascular Disease

## 2016-08-05 ENCOUNTER — Encounter: Payer: Self-pay | Admitting: Cardiovascular Disease

## 2016-08-05 VITALS — BP 150/72 | HR 66 | Ht 62.0 in | Wt 157.8 lb

## 2016-08-05 DIAGNOSIS — Z7689 Persons encountering health services in other specified circumstances: Secondary | ICD-10-CM | POA: Diagnosis not present

## 2016-08-05 DIAGNOSIS — R0989 Other specified symptoms and signs involving the circulatory and respiratory systems: Secondary | ICD-10-CM

## 2016-08-05 NOTE — Progress Notes (Signed)
Cardiology Office Note   Date:  08/05/2016   ID:  Sharon Mcdaniel, DOB 17-Feb-1936, MRN [id number]  PCP:  Jani Gravel, MD  Cardiologist:   Jenkins Rouge, MD   No chief complaint on file.     History of Present Illness: Sharon Mcdaniel is a 80 y.o. female who presents for cardiology evaluation. 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 COPD (chronic obstructive pulmonary disease) ([place])     Past Surgical History:  Procedure Laterality Date  . ABDOMINAL HYSTERECTOMY  1980  . BACK SURGERY  07/2011   synovial cyst removal  . TUBAL LIGATION  1970     Current Outpatient Prescriptions  Medication Sig Dispense Refill  . ATORVASTATIN CALCIUM PO Take 1 tablet by mouth daily. Unsure of strength    . azithromycin (ZITHROMAX) 250 MG tablet Take 1 tablet (250 mg total) by mouth daily. Take 2 on day one then 1 daily x 4 days 30 tablet 2  . budesonide (PULMICORT) 0.25 MG/2ML nebulizer solution One daily    . famotidine (PEPCID) 20 MG tablet One at bedtime 30 tablet 2  . ipratropium-albuterol (DUONEB) 0.5-2.5 (3) MG/3ML SOLN Take 3 mLs by nebulization 2 (two) times daily.      Marland Kitchen levothyroxine (SYNTHROID, LEVOTHROID) 112 MCG tablet Take 112 mcg by mouth daily.     . pantoprazole (PROTONIX) 40 MG tablet Take 1 tablet (40 mg total) by mouth daily. Take 30-60 min before first meal of the day 30 tablet 2  . Polyethyl Glycol-Propyl Glycol (SYSTANE OP) Apply 1 drop to eye as needed. For dry eyes      No current facility-administered medications for this visit.     Allergies:   Fluconazole in dextrose;  Gatifloxacin; Iodinated diagnostic agents; Ioxaglate; Quinolones; Tequin; and Codeine    Social History:  The patient  reports that she quit smoking about 12 years ago. She has a 30.00 pack-year smoking history. She has never used smokeless tobacco. She reports that she does not drink alcohol or use drugs.   Family History:  The patient's family history includes Asthma in her father; Heart disease in her father; Lung cancer in her mother; Rheum arthritis in her mother.    ROS:  Please see the history of present illness.   Otherwise, review of systems are positive for none.   All other systems are reviewed and negative.    PHYSICAL EXAM: VS:  There were no vitals taken for this visit. , BMI There is no height or weight on file to calculate BMI. Affect appropriate Healthy:  appears stated age 58: normal Neck supple with no adenopathy JVP normal lef  bruits no thyromegaly Lungs clear with no wheezing and good diaphragmatic motion Heart:  S1/S2 no murmur, no rub, gallop or click PMI normal Abdomen: benighn, BS positve, no tenderness, no AAA no bruit.  No HSM or HJR Distal pulses intact with no bruits No edema Neuro non-focal Skin warm and dry No muscular weakness    EKG:   NSR mild LAE normal    Recent Labs:  06/10/2016: BUN 17; Creatinine, Ser 0.71; Hemoglobin 13.8; Platelets 167.0; Potassium 4.1; Pro B Natriuretic peptide (BNP) 75.0; Sodium 133; TSH 0.49    Lipid Panel No results found for: CHOL, TRIG, HDL, CHOLHDL, VLDL, LDLCALC, LDLDIRECT    Wt Readings from Last 3 Encounters:  07/08/16 73.4 kg (161 lb 12.8 oz)  06/10/16 74.4 kg (164 lb)  11/03/11 66 kg (145 lb 8 oz)      Other studies Reviewed: Additional studies/ records that were reviewed today include: Notes Dr Einar Gip Echo and duplex .    ASSESSMENT AND PLAN:  1.  Facial Paresthesia etiology not clear but low likelyhood of TIA doubt PAF resolved 2.  Left Carotid Bruit last duplex plaque no stenosis will  repeat  3. Thyroid  Euthyroid continue current replacement dose TSH with primary  4. GERD low carb diet pepcid    Current medicines are reviewed at length with the patient today.  The patient does not have concerns regarding medicines.  The following changes have been made:  no change  Labs/ tests ordered today include: Carotid Duplex  No orders of the defined types were placed in this encounter.    Disposition:   FU with me in a year      Signed, Jenkins Rouge, MD  08/05/2016 9:13 AM    [facility] [address], [place], [place]  [address] Phone: [phone]; Fax: [phone]

## 2016-08-05 NOTE — Patient Instructions (Signed)
Medication Instructions:  Your physician recommends that you continue on your current medications as directed. Please refer to the Current Medication list given to you today.  Labwork: NONE  Testing/Procedures: Your physician has requested that you have a carotid duplex. This test is an ultrasound of the carotid arteries in your neck. It looks at blood flow through these arteries that supply the brain with blood. Allow one hour for this exam. There are no restrictions or special instructions.  Follow-Up: Your physician wants you to follow-up in: 12 months with Dr. Nishan. You will receive a reminder letter in the mail two months in advance. If you don't receive a letter, please call our office to schedule the follow-up appointment.   If you need a refill on your cardiac medications before your next appointment, please call your pharmacy.    

## 2016-08-06 ENCOUNTER — Ambulatory Visit (INDEPENDENT_AMBULATORY_CARE_PROVIDER_SITE_OTHER)
Admission: RE | Admit: 2016-08-06 | Discharge: 2016-08-06 | Disposition: A | Discharge status: Home / Self Care | Payer: Medicare Other | Source: Ambulatory Visit | Attending: Internal Medicine | Admitting: Internal Medicine

## 2016-08-06 DIAGNOSIS — R059 Cough, unspecified: Secondary | ICD-10-CM

## 2016-08-06 DIAGNOSIS — R05 Cough: Secondary | ICD-10-CM

## 2016-08-06 DIAGNOSIS — J984 Other disorders of lung: Secondary | ICD-10-CM | POA: Diagnosis not present

## 2016-08-06 DIAGNOSIS — Z23 Encounter for immunization: Secondary | ICD-10-CM | POA: Diagnosis not present

## 2016-08-07 ENCOUNTER — Ambulatory Visit (HOSPITAL_COMMUNITY)
Admission: RE | Admit: 2016-08-07 | Discharge: 2016-08-07 | Disposition: A | Discharge status: Home / Self Care | Payer: Medicare Other | Source: Ambulatory Visit | Attending: Cardiology | Admitting: Cardiology

## 2016-08-07 DIAGNOSIS — I6523 Occlusion and stenosis of bilateral carotid arteries: Secondary | ICD-10-CM | POA: Diagnosis not present

## 2016-08-07 DIAGNOSIS — R0989 Other specified symptoms and signs involving the circulatory and respiratory systems: Secondary | ICD-10-CM | POA: Insufficient documentation

## 2016-08-07 NOTE — Progress Notes (Signed)
Spoke with pt and notified of results per Dr. Wert. Pt verbalized understanding and denied any questions. 

## 2016-08-10 ENCOUNTER — Telehealth: Payer: Self-pay

## 2016-08-10 NOTE — Telephone Encounter (Signed)
Notes Recorded by Josue Hector, MD on 08/07/2016 at 5:40 PM EDT Plaque no stenosis good f/udupelx in 2 years

## 2016-08-14 ENCOUNTER — Ambulatory Visit (INDEPENDENT_AMBULATORY_CARE_PROVIDER_SITE_OTHER): Payer: Medicare Other | Admitting: Internal Medicine

## 2016-08-14 ENCOUNTER — Inpatient Hospital Stay: Admission: RE | Admit: 2016-08-14 | Payer: Medicare Other | Source: Ambulatory Visit

## 2016-08-14 ENCOUNTER — Encounter: Payer: Self-pay | Admitting: Internal Medicine

## 2016-08-14 VITALS — BP 120/70 | HR 60 | Ht 62.0 in | Wt 156.8 lb

## 2016-08-14 DIAGNOSIS — R059 Cough, unspecified: Secondary | ICD-10-CM

## 2016-08-14 DIAGNOSIS — R938 Abnormal findings on diagnostic imaging of other specified body structures: Secondary | ICD-10-CM

## 2016-08-14 DIAGNOSIS — R05 Cough: Secondary | ICD-10-CM | POA: Diagnosis not present

## 2016-08-14 DIAGNOSIS — J449 Chronic obstructive pulmonary disease, unspecified: Secondary | ICD-10-CM | POA: Diagnosis not present

## 2016-08-14 DIAGNOSIS — R9389 Abnormal findings on diagnostic imaging of other specified body structures: Secondary | ICD-10-CM

## 2016-08-14 NOTE — Progress Notes (Signed)
Subjective:     Patient ID: Sharon Mcdaniel, female   DOB: 27-Apr-1936      MRN: [id number]    Brief patient profile:   9 yowf quit smoking in 2005 with severe CAP dx as GOLD II copd Sept 2007 and referred to pulmonary clinic 06/10/2016 by Dr Kim/ Thedora Hinders for abn CT chest    History of Present Illness  06/10/2016 1st [facility] office visit/ Wert   Chief Complaint  Patient presents with  . Pulmonary Consult    Pt referred by Dr Epimenio Sarin for COPD/lung mass. Recent PET scan 05/11/16.   onset of new pattern in Dec 2016 with severe cough that comes and goes and a persistent sense of doe that persisteed since them not back to baseline esp Worse in  June 2017 but improved p abx Doe = MMRC1 = can walk nl pace, flat grade, can't hurry or go uphills or steps s sob Since onset of recurrent cough has had unresolved nasal congestion and intermittent purulent nasal drainage correlating with flares of "bronchitis' pred / abx def help cough but keeps coming back sev weeks later  rec  schedule sinus CT> neg 06/15/16  GERD  Diet   07/08/2016  f/u ov/Wert re:AB/ does not meet criteria for copd ? Bronchiectatic ? Chief Complaint  Patient presents with  . Follow-up    PFT done today. She woke up with stuffy head and rhinits. She denies any changes in her breathing.   typical scenario is nasty nasal drainage gradually settles  into chest over a week then better p abx and pred x multiple episodes since Oct 2016  In between flares feels fine maintained on albuterol/ ipratropium/ bud twice daily and Not limited by breathing from desired activities   rec zithromax 250 mg daily  Pantoprazole (protonix) 40 mg   Take  30-60 min before first meal of the day and Pepcid (famotidine)  20 mg one @  bedtime until return to office - this is the best way to tell whether stomach acid is contributing to your problem.   Only take the am dose of budesonide/albuterol and ipaptropium then the other doses at lunch /  supper / bedtime are just the albuterol/ipatropium as needed for cough or wheeze or short of breath For  cough use your flutter valve as much as possible  Please schedule a follow up office visit in 4 weeks, sooner if needed with HRCT of chest done 08/06/16 1. No evidence of interstitial lung disease. 2. Stable biapical pleural-parenchymal scarring, unchanged back to 2005. Subpleural nodular focus of consolidation in the peripheral right upper lobe is also stable back to 2005 and benign. 3. Previously described small focus of consolidation in the medial right upper lobe and foci of tree-in-bud opacity and ground-glass attenuation in the medial right lower lobe and peripheral left upper lobe on the 05/11/2016 PET-CT study have resolved.     08/14/2016  f/u ov/Wert re: AB/ HRCT s bronchiectasis or ILD on pulmonicort 0.25 duoneb each am only  Chief Complaint  Patient presents with  . Follow-up    Breathing has improved since her last visit. She is no longer coughing. No new co's today.    developed diarrhea p zpak x one week so d/c'd but cough has resolved. Not limited by breathing from desired activities      No obvious patterns in  day to day or daytime variability or assoc   mucus plugs or hemoptysis or cp or chest  tightness, subjective wheeze or overt   hb symptoms. No unusual exp hx or h/o childhood pna/ asthma or knowledge of premature birth.  Sleeping ok without nocturnal  or early am exacerbation  of respiratory  c/o's or need for noct saba. Also denies any obvious fluctuation of symptoms with weather or environmental changes or other aggravating or alleviating factors except as outlined above   Current Medications, Allergies, Complete Past Medical History, Past Surgical History, Family History, and Social History were reviewed in [employer] record.  ROS  The following are not active complaints unless bolded sore throat, dysphagia, dental problems, itching,  sneezing,  nasal congestion or excess/ purulent secretions, ear ache,   fever, chills, sweats, unintended wt loss, classically pleuritic or exertional cp,  orthopnea pnd or leg swelling, presyncope, palpitations, abdominal pain, anorexia, nausea, vomiting, diarrhea  or change in bowel or bladder habits, change in stools or urine, dysuria,hematuria,  rash, arthralgias, visual complaints, headache, numbness, weakness or ataxia or problems with walking or coordination,  change in mood/affect or memory.           Objective:   Physical Exam   amb wf nad     08/14/2016     157   07/08/16 161 lb 12.8 oz (73.4 kg)  06/10/16 164 lb (74.4 kg)  11/03/11 145 lb 8 oz (66 kg)    Vital signs reviewed - Note on arrival 02 sats  97% on RA      HEENT: full dentures/ nl   and oropharynx. Nl external ear canals without cough reflex- moderate bilateral non-specific turbinate edema     NECK :  without JVD/Nodes/TM/ nl carotid upstrokes bilaterally   LUNGS: no acc muscle use,  Nl contour chest which is clear to A and P bilaterally without cough on insp or exp maneuvers   CV:  RRR  no s3 or murmur or increase in P2, no edema   ABD:  soft and nontender with nl inspiratory excursion in the supine position. No bruits or organomegaly, bowel sounds nl  MS:  Nl gait/ ext warm without deformities, calf tenderness, cyanosis or clubbing No obvious joint restrictions   SKIN: warm and dry without lesions    NEURO:  alert, approp, nl sensorium with  no motor deficits        Labs reviewed:     Chemistry      Component Value Date/Time   NA 133 (L) 06/10/2016 1712   K 4.1 06/10/2016 1712   CL 100 06/10/2016 1712   CO2 25 06/10/2016 1712   BUN 17 06/10/2016 1712   CREATININE 0.71 06/10/2016 1712      Component Value Date/Time   CALCIUM 9.1 06/10/2016 1712   ALKPHOS 88 11/03/2011 1300   AST 25 11/03/2011 1300   ALT 24 11/03/2011 1300   BILITOT 0.2 (L) 11/03/2011 1300        Lab Results   Component Value Date   WBC 8.6 06/10/2016   HGB 13.8 06/10/2016   HCT 40.9 06/10/2016   MCV 85.8 06/10/2016   PLT 167.0 06/10/2016         Lab Results  Component Value Date   TSH 0.49 06/10/2016     Lab Results  Component Value Date   PROBNP 75.0 06/10/2016       Lab Results  Component Value Date   ESRSEDRATE 9 06/10/2016           Assessment:

## 2016-08-14 NOTE — Patient Instructions (Signed)
No change in your medications.     Please schedule a follow up visit in 6 months but call sooner if needed  

## 2016-08-16 NOTE — Assessment & Plan Note (Signed)
PFTs July 06, 2006    FEV1 of 58% predicted with a ratio of 56%  And 15% improvement after bronchodilator consistent with an asthmatic component. - PFT's  07/08/2016  FEV1 1.45 (79 % ) ratio 74  p 10 % improvement from saba p duoneb > 6 h  prior to study with DLCO  52 % corrects to 71  % for alv volume    Controlled on min ICS neb form and has duoneb to use up to qid but only uses once daily and this is effective so no need to change rx at this point   I had an extended discussion with the patient reviewing all relevant studies completed to date and  lasting 15 to 20 minutes of a 25 minute visit    Each maintenance medication was reviewed in detail including most importantly the difference between maintenance and prns and under what circumstances the prns are to be triggered using an action plan format that is not reflected in the computer generated alphabetically organized AVS.    Please see instructions for details which were reviewed in writing and the patient given a copy highlighting the part that I personally wrote and discussed at today's ov.

## 2016-08-16 NOTE — Assessment & Plan Note (Signed)
Allergy profile 06/10/2016 >  Eos 0.1/  IgE 7 RAST neg  - Sinus CT 06/15/2016 > neg     rx for gerd /AB effective > no change needed

## 2016-08-16 NOTE — Assessment & Plan Note (Addendum)
See CT chest 04/30/16 and PET 05/11/16 Right upper lobe subpleural nodular density shows absence of metabolic activity, suggesting benign etiology.  Bilateral multi lobar areas of airspace disease and reticulonodular opacities, which show metabolic activity and are new since recent CT, most consistent with acute infectious or inflammatory etiology. - chest X-ray 06/10/2016  No as dz p rx with abx/pred - HRCT 08/06/16 : 1. No evidence of interstitial lung disease. 2. Stable biapical pleural-parenchymal scarring, unchanged back to 2005. Subpleural nodular focus of consolidation in the peripheral right upper lobe is also stable back to 2005 and benign. 3. Previously described small focus of consolidation in the medial right upper lobe and foci of tree-in-bud opacity and ground-glass attenuation in the medial right lower lobe and peripheral left upper lobe on the 05/11/2016 PET-CT study have resolved. No acute consolidative airspace disease > no further f/u needed  Discussed also the implication of athrosclerosis incidentally found on CT> already on statin > Follow up per Primary Care planned

## 2016-08-17 ENCOUNTER — Encounter: Payer: Self-pay | Admitting: Vascular Surgery

## 2016-08-21 ENCOUNTER — Encounter: Payer: Medicare Other | Admitting: Vascular Surgery

## 2016-08-21 ENCOUNTER — Encounter (HOSPITAL_COMMUNITY): Payer: Medicare Other

## 2016-09-21 ENCOUNTER — Other Ambulatory Visit: Payer: Self-pay | Admitting: *Deleted

## 2016-09-21 DIAGNOSIS — I83812 Varicose veins of left lower extremities with pain: Secondary | ICD-10-CM

## 2016-09-23 ENCOUNTER — Encounter: Payer: Self-pay | Admitting: Vascular Surgery

## 2016-09-30 ENCOUNTER — Ambulatory Visit (HOSPITAL_COMMUNITY)
Admission: RE | Admit: 2016-09-30 | Discharge: 2016-09-30 | Disposition: A | Discharge status: Home / Self Care | Payer: Medicare Other | Source: Ambulatory Visit | Attending: Vascular Surgery | Admitting: Vascular Surgery

## 2016-09-30 ENCOUNTER — Other Ambulatory Visit: Payer: Self-pay | Admitting: *Deleted

## 2016-09-30 ENCOUNTER — Encounter: Payer: Self-pay | Admitting: Vascular Surgery

## 2016-09-30 ENCOUNTER — Ambulatory Visit (INDEPENDENT_AMBULATORY_CARE_PROVIDER_SITE_OTHER): Payer: Medicare Other | Admitting: Vascular Surgery

## 2016-09-30 VITALS — BP 134/70 | HR 65 | Temp 98.3°F | Resp 16 | Ht 62.5 in | Wt 159.0 lb

## 2016-09-30 DIAGNOSIS — I83812 Varicose veins of left lower extremities with pain: Secondary | ICD-10-CM

## 2016-09-30 DIAGNOSIS — I83892 Varicose veins of left lower extremities with other complications: Secondary | ICD-10-CM

## 2016-09-30 NOTE — Progress Notes (Signed)
[facility] [place]  Patient name: Sharon Mcdaniel MRN: [id number] DOB: [birth year] Sex: female  REASON FOR CONSULT: Painful varicosities left posterior calf  HPI: Sharon Mcdaniel is a 80 y.o. female, who is seen today for evaluation of painful varicosities. 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 She reports these are increasingly painful. They've been present for a number of years but are now to the point where she has a great deal of discomfort with prolonged standing. She has warn knee-high compression with the no relief. Eyes any prior history of bleeding from these. She does have a remote history of venous thrombosis in 1968. Is difficult to tell whether this was superficial thrombophlebitis or deep venous thrombosis. She reports that she was placed at bedrest for several weeks following this. History of PE and no other history of venous pathology. She is otherwise very healthy and quite active.  Past Medical History:  Diagnosis Date  . COPD (chronic obstructive pulmonary disease) (HCC)     Family History  Problem Relation Age of Onset  . Lung cancer Mother   . Rheum arthritis Mother   . Asthma Father   . Heart disease Father     SOCIAL HISTORY: Social History   Social History  . Marital status: Widowed    Spouse name: N/A  . Number of children: N/A  . Years of education: N/A   Occupational History  . retired    Social History Main Topics  . Smoking status: Former Smoker    Packs/day: 1.00    Years: 30.00    Quit date: 11/03/2003  . Smokeless tobacco: Never Used  . Alcohol use No  . Drug use: No  . Sexual activity: No   Other Topics Concern  . Not on file   Social History Narrative  . No narrative on file    Allergies  Allergen Reactions  . Fluconazole In Dextrose Hives and Shortness Of Breath  . Gatifloxacin Hives and Shortness Of Breath  . Iodinated  Diagnostic Agents Hives and Shortness Of Breath  . Ioxaglate Hives and Shortness Of Breath  . Quinolones Hives and Shortness Of Breath  . Tequin Hives and Shortness Of Breath  . Zithromax [Azithromycin]     GI Upset  . Codeine Other (See Comments)    Makes her jittery     Current Outpatient Prescriptions  Medication Sig Dispense Refill  . ATORVASTATIN CALCIUM PO Take 1 tablet by mouth daily. Unsure of strength    . budesonide (PULMICORT) 0.25 MG/2ML nebulizer solution Take 0.25 mg by nebulization daily.    . famotidine (PEPCID) 20 MG tablet Take 20 mg by mouth daily.    Marland Kitchen ipratropium-albuterol (DUONEB) 0.5-2.5 (3) MG/3ML SOLN Take 3 mLs by nebulization daily.     Marland Kitchen levothyroxine (SYNTHROID, LEVOTHROID) 112 MCG tablet Take 112 mcg by mouth daily.     . pantoprazole (PROTONIX) 40 MG tablet Take 1 tablet (40 mg total) by mouth daily. Take 30-60 min before first meal of the day 30 tablet 2  . Polyethyl Glycol-Propyl Glycol (SYSTANE OP) Apply 1 drop to eye as needed. For dry eyes      No current facility-administered medications for this visit.     REVIEW OF SYSTEMS:  [X]  denotes positive finding, [ ]  denotes negative finding Cardiac  Comments:  Chest pain or chest pressure:    Shortness of breath upon exertion: x   Short of breath  when lying flat:    Irregular heart rhythm:        Vascular    Pain in calf, thigh, or hip brought on by ambulation: x   Pain in feet at night that wakes you up from your sleep:     Blood clot in your veins:    Leg swelling:         Pulmonary    Oxygen at home:    Productive cough:     Wheezing:         Neurologic    Sudden weakness in arms or legs:     Sudden numbness in arms or legs:     Sudden onset of difficulty speaking or slurred speech:    Temporary loss of vision in one eye:     Problems with dizziness:         Gastrointestinal    Blood in stool:     Vomited blood:         Genitourinary    Burning when urinating:     Blood in  urine:        Psychiatric    Major depression:         Hematologic    Bleeding problems:    Problems with blood clotting too easily:        Skin    Rashes or ulcers:        Constitutional    Fever or chills:      PHYSICAL EXAM: Vitals:   09/30/16 1457  BP: 134/70  Pulse: 65  Resp: 16  Temp: 98.3 F (36.8 C)  SpO2: 97%  Weight: 159 lb (72.1 kg)  Height: 5' 2.5" (1.588 m)    GENERAL: The patient is a well-nourished female, in no acute distress. The vital signs are documented above. CARDIOVASCULAR: [place] radial and 2+ dorsalis pedis pulses bilaterally area carotid arteries without bruits bilaterally PULMONARY: There is good air exchange  ABDOMEN: Soft and non-tender  MUSCULOSKELETAL: There are no major deformities or cyanosis. NEUROLOGIC: No focal weakness or paresthesias are detected. SKIN: There are no ulcers or rashes noted. PSYCHIATRIC: The patient has a normal affect. Does have a plexus of prominent varicosities in her left posterior calf some mild tenderness over this.  DATA:  Venous duplex reveals no incompetence in her great saphenous vein. She does have incompetence in her small saphenous vein with dilatation over 5 mm.  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 I have instructed her on the importance of elevation and compression. We have repair for thigh-high graduated compression garments. Instructed her on the use of these. We will see her again in 3 months to determine if this is assisting her pain. Instructed her on the importance of elevation when possible and ibuprofen for discomfort. We'll see her again in 3 months for continued discussion   Rosetta Posner, MD [facility] Tel [phone] Pager [phone]

## 2016-10-19 DIAGNOSIS — H18413 Arcus senilis, bilateral: Secondary | ICD-10-CM | POA: Diagnosis not present

## 2016-10-19 DIAGNOSIS — H01112 Allergic dermatitis of right lower eyelid: Secondary | ICD-10-CM | POA: Diagnosis not present

## 2016-10-19 DIAGNOSIS — H04123 Dry eye syndrome of bilateral lacrimal glands: Secondary | ICD-10-CM | POA: Diagnosis not present

## 2016-10-19 DIAGNOSIS — H11441 Conjunctival cysts, right eye: Secondary | ICD-10-CM | POA: Diagnosis not present

## 2016-10-19 DIAGNOSIS — H40023 Open angle with borderline findings, high risk, bilateral: Secondary | ICD-10-CM | POA: Diagnosis not present

## 2016-10-19 DIAGNOSIS — H11153 Pinguecula, bilateral: Secondary | ICD-10-CM | POA: Diagnosis not present

## 2016-10-19 DIAGNOSIS — H11423 Conjunctival edema, bilateral: Secondary | ICD-10-CM | POA: Diagnosis not present

## 2016-11-10 DIAGNOSIS — Z9849 Cataract extraction status, unspecified eye: Secondary | ICD-10-CM | POA: Diagnosis not present

## 2016-11-10 DIAGNOSIS — H04123 Dry eye syndrome of bilateral lacrimal glands: Secondary | ICD-10-CM | POA: Diagnosis not present

## 2016-11-10 DIAGNOSIS — H11423 Conjunctival edema, bilateral: Secondary | ICD-10-CM | POA: Diagnosis not present

## 2016-11-10 DIAGNOSIS — H18413 Arcus senilis, bilateral: Secondary | ICD-10-CM | POA: Diagnosis not present

## 2016-11-10 DIAGNOSIS — H11153 Pinguecula, bilateral: Secondary | ICD-10-CM | POA: Diagnosis not present

## 2016-11-10 DIAGNOSIS — Z961 Presence of intraocular lens: Secondary | ICD-10-CM | POA: Diagnosis not present

## 2016-11-10 DIAGNOSIS — H1851 Endothelial corneal dystrophy: Secondary | ICD-10-CM | POA: Diagnosis not present

## 2016-11-10 DIAGNOSIS — H40023 Open angle with borderline findings, high risk, bilateral: Secondary | ICD-10-CM | POA: Diagnosis not present

## 2016-11-10 DIAGNOSIS — H40003 Preglaucoma, unspecified, bilateral: Secondary | ICD-10-CM | POA: Diagnosis not present

## 2016-12-29 ENCOUNTER — Encounter: Payer: Self-pay | Admitting: Vascular Surgery

## 2017-01-05 ENCOUNTER — Ambulatory Visit: Payer: Medicare Other | Admitting: Vascular Surgery

## 2017-02-03 ENCOUNTER — Other Ambulatory Visit: Payer: Self-pay | Admitting: Internal Medicine

## 2017-02-03 DIAGNOSIS — Z1231 Encounter for screening mammogram for malignant neoplasm of breast: Secondary | ICD-10-CM

## 2017-02-04 ENCOUNTER — Telehealth: Payer: Self-pay | Admitting: Internal Medicine

## 2017-02-04 NOTE — Telephone Encounter (Signed)
Left message to call back to reschedule her 03/05/17 appt. Dr. Melvyn Novas will be in a meeting that afternoon.

## 2017-02-05 ENCOUNTER — Ambulatory Visit
Admission: RE | Admit: 2017-02-05 | Discharge: 2017-02-05 | Disposition: A | Discharge status: Home / Self Care | Payer: Medicare Other | Source: Ambulatory Visit | Attending: Internal Medicine | Admitting: Internal Medicine

## 2017-02-05 DIAGNOSIS — Z1231 Encounter for screening mammogram for malignant neoplasm of breast: Secondary | ICD-10-CM

## 2017-02-05 NOTE — Telephone Encounter (Signed)
Pt called back to reschedule.

## 2017-02-11 ENCOUNTER — Encounter: Payer: Self-pay | Admitting: Vascular Surgery

## 2017-02-12 ENCOUNTER — Ambulatory Visit: Payer: Medicare Other | Admitting: Internal Medicine

## 2017-02-23 ENCOUNTER — Encounter: Payer: Self-pay | Admitting: Vascular Surgery

## 2017-02-23 ENCOUNTER — Ambulatory Visit (INDEPENDENT_AMBULATORY_CARE_PROVIDER_SITE_OTHER): Payer: Medicare Other | Admitting: Vascular Surgery

## 2017-02-23 VITALS — BP 133/68 | HR 60 | Temp 98.0°F | Resp 16 | Ht 62.0 in | Wt 150.0 lb

## 2017-02-23 DIAGNOSIS — I83892 Varicose veins of left lower extremities with other complications: Secondary | ICD-10-CM

## 2017-02-23 NOTE — Progress Notes (Signed)
[facility] [place]  Patient name: Sharon Mcdaniel MRN: [id number] DOB: April 12, 1936 Sex: female  REASON FOR VISIT: Follow-up painful left leg varicosities  HPI: Sharon Mcdaniel is a 81 y.o. female here today for follow-up. She has been compliant with her compression and elevation. 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 Portions she has no history of DVT. She does have swelling in her lower extremity but no hyperpigmentation.  Past Medical History:  Diagnosis Date  . COPD (chronic obstructive pulmonary disease) (HCC)     Family History  Problem Relation Age of Onset  . Lung cancer Mother   . Rheum arthritis Mother   . Asthma Father   . Heart disease Father     SOCIAL HISTORY: Social History  Substance Use Topics  . Smoking status: Former Smoker    Packs/day: 1.00    Years: 30.00    Quit date: 11/03/2003  . Smokeless tobacco: Never Used  . Alcohol use No    Allergies  Allergen Reactions  . Fluconazole In Dextrose Hives and Shortness Of Breath  . Gatifloxacin Hives and Shortness Of Breath  . Iodinated Diagnostic Agents Hives and Shortness Of Breath  . Ioxaglate Hives and Shortness Of Breath  . Quinolones Hives and Shortness Of Breath  . Tequin Hives and Shortness Of Breath  . Zithromax [Azithromycin]     GI Upset  . Codeine Other (See Comments)    Makes her jittery     Current Outpatient Prescriptions  Medication Sig Dispense Refill  . ATORVASTATIN CALCIUM PO Take 1 tablet by mouth daily. Unsure of strength    . budesonide (PULMICORT) 0.25 MG/2ML nebulizer solution Take 0.25 mg by nebulization daily.    . famotidine (PEPCID) 20 MG tablet Take 20 mg by mouth daily.    Marland Kitchen ipratropium-albuterol (DUONEB) 0.5-2.5 (3) MG/3ML SOLN Take 3 mLs by nebulization daily.     Marland Kitchen levothyroxine (SYNTHROID, LEVOTHROID) 112 MCG tablet Take 112 mcg by  mouth daily.     . pantoprazole (PROTONIX) 40 MG tablet Take 1 tablet (40 mg total) by mouth daily. Take 30-60 min before first meal of the day 30 tablet 2  . Polyethyl Glycol-Propyl Glycol (SYSTANE OP) Apply 1 drop to eye as needed. For dry eyes      No current facility-administered medications for this visit.     REVIEW OF SYSTEMS:  [X]  denotes positive finding, [ ]  denotes negative finding Cardiac  Comments:  Chest pain or chest pressure:    Shortness of breath upon exertion: x   Short of breath when lying flat:    Irregular heart rhythm:        Vascular    Pain in calf, thigh, or hip brought on by ambulation:    Pain in feet at night that wakes you up from your sleep:     Blood clot in your veins:    Leg swelling:           PHYSICAL EXAM: Vitals:   02/23/17 1514  BP: 133/68  Pulse: 60  Resp: 16  Temp: 98 F (36.7 C)  SpO2: 97%  Weight: 150 lb (68 kg)  Height: 5\' 2"  (1.575 m)    GENERAL: The patient is a well-nourished female, in no acute distress. The vital signs are documented above. CARDIOVASCULAR: Palpable dorsalis pedis pulse. Venous varicosities in her left posterior calf PULMONARY: There is good air exchange  MUSCULOSKELETAL: There  are no major deformities or cyanosis. NEUROLOGIC: No focal weakness or paresthesias are detected. SKIN: There are no ulcers or rashes noted. PSYCHIATRIC: The patient has a normal affect.  DATA:  Reviewed her duplex from 09/30/2016 revealing reflux in the small saphenous vein with dilatation over 5 mm.  MEDICAL ISSUES: Aortic conservative therapy of reflux in her small saphenous vein extending into painful varicosities. 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 She understands this is under local anesthesia in our office. She wishes to schedule this in [place] June after some commitments she has in the month of May. I did discuss the very slight risk  of DVT associated with procedure    Rosetta Posner, MD FACS [facility] [phone] Pager [phone]

## 2017-03-05 ENCOUNTER — Ambulatory Visit: Payer: Medicare Other | Admitting: Internal Medicine

## 2017-03-10 ENCOUNTER — Ambulatory Visit (INDEPENDENT_AMBULATORY_CARE_PROVIDER_SITE_OTHER): Payer: Medicare Other | Admitting: Internal Medicine

## 2017-03-10 ENCOUNTER — Encounter: Payer: Self-pay | Admitting: Internal Medicine

## 2017-03-10 VITALS — BP 118/70 | HR 58 | Ht 62.0 in | Wt 152.0 lb

## 2017-03-10 DIAGNOSIS — R938 Abnormal findings on diagnostic imaging of other specified body structures: Secondary | ICD-10-CM

## 2017-03-10 DIAGNOSIS — R9389 Abnormal findings on diagnostic imaging of other specified body structures: Secondary | ICD-10-CM

## 2017-03-10 DIAGNOSIS — J449 Chronic obstructive pulmonary disease, unspecified: Secondary | ICD-10-CM

## 2017-03-10 DIAGNOSIS — J4489 Other specified chronic obstructive pulmonary disease: Secondary | ICD-10-CM

## 2017-03-10 NOTE — Patient Instructions (Signed)
Please schedule a follow up visit in 12  months but call sooner if needed  

## 2017-03-10 NOTE — Assessment & Plan Note (Addendum)
PFTs July 06, 2006   FEV1 of 58% predicted with a ratio of 56%  And 15% improvement after bronchodilator consistent with an asthmatic component. Allergy profile 06/10/2016 >  Eos 0.1/  IgE 7 RAST neg  - Sinus CT 06/15/2016 > neg    - PFT's  07/08/2016  FEV1 1.45 (79 % ) ratio 74  p 10 % improvement from saba p duoneb > 6 h  prior to study with DLCO  52 % corrects to 71  % for alv volume    All goals of chronic asthma control met including optimal function and elimination of symptoms with minimal need for rescue therapy.  Contingencies discussed in full including contacting this office immediately if not controlling the symptoms using the rule of two's.      Each maintenance medication was reviewed in detail including most importantly the difference between maintenance and as needed and under what circumstances the prns are to be used.  Please see AVS for specific  Instructions which are unique to this visit and I personally typed out  which were reviewed in detail in writing with the patient and a copy provided.

## 2017-03-10 NOTE — Assessment & Plan Note (Addendum)
See CT chest 04/30/16 and PET 05/11/16 Right upper lobe subpleural nodular density shows absence of metabolic activity, suggesting benign etiology.  Bilateral multi lobar areas of airspace disease and reticulonodular opacities, which show metabolic activity and are new since recent CT, most consistent with acute infectious or inflammatory etiology. - chest X-ray 06/10/2016  No as dz p rx with abx/pred - HRCT 08/06/16 : 1. No evidence of interstitial lung disease. 2. Stable biapical pleural-parenchymal scarring, unchanged back to 2005. Subpleural nodular focus of consolidation in the peripheral right upper lobe is also stable back to 2005 and benign. 3. Previously described small focus of consolidation in the medial right upper lobe and foci of tree-in-bud opacity and ground-glass attenuation in the medial right lower lobe and peripheral left upper lobe on the 05/11/2016 PET-CT study have resolved. No acute consolidative airspace disease > no further f/u needed    We will just see her here yearly for refills on her neb solutions as she is on a bit of an unusual combination but very effective so no changes rec

## 2017-03-10 NOTE — Progress Notes (Signed)
Subjective:     Patient ID: Sharon Mcdaniel, female   DOB: [birth year]      MRN: [id number]    Brief patient profile:   8 yowf quit smoking in 2005 with severe CAP dx as GOLD II copd Sept 2007 and referred to pulmonary clinic 06/10/2016 by Sharon Mcdaniel/ Sharon Mcdaniel for abn CT chest    History of Present Illness  06/10/2016 1st [place] [facility] office visit/ Sharon Mcdaniel   Chief Complaint  Patient presents with  . Pulmonary Consult    Pt referred by Sharon Mcdaniel for COPD/lung mass. Recent PET scan 05/11/16.   onset of new pattern in Dec 2016 with severe cough that comes and goes and a persistent sense of doe that persisteed since them not back to baseline esp Worse in  June 2017 but improved p abx Doe = MMRC1 = can walk nl pace, flat grade, can't hurry or go uphills or steps s sob Since onset of recurrent cough has had unresolved nasal congestion and intermittent purulent nasal drainage correlating with flares of "bronchitis' pred / abx def help cough but keeps coming back sev weeks later  rec  schedule sinus CT> neg 06/15/16  GERD  Diet   07/08/2016  f/u ov/Sharon Mcdaniel re:AB/ does not meet criteria for copd ? Bronchiectatic ? Chief Complaint  Patient presents with  . Follow-up    PFT done today. She woke up with stuffy head and rhinits. She denies any changes in her breathing.   typical scenario is nasty nasal drainage gradually settles  into chest over a week then better p abx and pred x multiple episodes since Oct 2016  In between flares feels fine maintained on albuterol/ ipratropium/ bud twice daily and Not limited by breathing from desired activities  rec zithromax 250 mg daily  Pantoprazole (protonix) 40 mg   Take  30-60 min before first meal of the day and Pepcid (famotidine)  20 mg one @  bedtime until return to office - this is the best way to tell whether stomach acid is contributing to your problem.   Only take the am dose of budesonide/albuterol and ipaptropium then the other doses at lunch /  supper / bedtime are just the albuterol/ipatropium as needed for cough or wheeze or short of breath For  cough use your flutter valve as much as possible  Please schedule a follow up office visit in 4 weeks, sooner if needed with HRCT of chest done 08/06/16 1. No evidence of interstitial lung disease. 2. Stable biapical pleural-parenchymal scarring, unchanged back to 2005. Subpleural nodular focus of consolidation in the peripheral right upper lobe is also stable back to 2005 and benign. 3. Previously described small focus of consolidation in the medial right upper lobe and foci of tree-in-bud opacity and ground-glass attenuation in the medial right lower lobe and peripheral left upper lobe on the 05/11/2016 PET-CT study have resolved.      03/10/2017  f/u ov/Sharon Mcdaniel re:  AB on pulmocort and duoneb daily and occ a second dose in pm  Chief Complaint  Patient presents with  . Follow-up    Breathing is overall doing well and no new co's.   Not limited by breathing from desired activities    No obvious day to day or daytime variability or assoc excess/ purulent sputum or mucus plugs or hemoptysis or cp or chest tightness, subjective wheeze or overt sinus or hb symptoms. No unusual exp hx or h/o childhood pna/ asthma or knowledge of premature birth.  Sleeping ok without nocturnal  or early am exacerbation  of respiratory  c/o's or need for noct saba. Also denies any obvious fluctuation of symptoms with weather or environmental changes or other aggravating or alleviating factors except as outlined above   Current Medications, Allergies, Complete Past Medical History, Past Surgical History, Family History, and Social History were reviewed in [employer] record.  ROS  The following are not active complaints unless bolded sore throat, dysphagia, dental problems, itching, sneezing,  nasal congestion or excess/ purulent secretions, ear ache,   fever, chills, sweats, unintended wt  loss, classically pleuritic or exertional cp,  orthopnea pnd or leg swelling, presyncope, palpitations, abdominal pain, anorexia, nausea, vomiting, diarrhea  or change in bowel or bladder habits, change in stools or urine, dysuria,hematuria,  rash, arthralgias, visual complaints, headache, numbness, weakness or ataxia or problems with walking or coordination,  change in mood/affect or memory.                Objective:   Physical Exam   amb robust wf nad  03/10/2017          152   08/14/2016     157   07/08/16 161 lb 12.8 oz (73.4 kg)  06/10/16 164 lb (74.4 kg)  11/03/11 145 lb 8 oz (66 kg)    Vital signs reviewed - - Note on arrival 02 sats  96% on RA    HEENT: full dentures/ nl   and oropharynx. Nl external ear canals without cough reflex- mild  bilateral non-specific turbinate edema     NECK :  without JVD/Nodes/TM/ nl carotid upstrokes bilaterally   LUNGS: no acc muscle use,  Nl contour chest which is clear to A and P bilaterally without cough on insp or exp maneuvers   CV:  RRR  no s3 or murmur or increase in P2, no edema   ABD:  soft and nontender with nl inspiratory excursion in the supine position. No bruits or organomegaly, bowel sounds nl  MS:  Nl gait/ ext warm without deformities, calf tenderness, cyanosis or clubbing No obvious joint restrictions   SKIN: warm and dry without lesions    NEURO:  alert, approp, nl sensorium with  no motor deficits               Assessment:       Outpatient Encounter Prescriptions as of 03/10/2017  Medication Sig  . ATORVASTATIN CALCIUM PO Take 1 tablet by mouth daily. Unsure of strength  . budesonide (PULMICORT) 0.25 MG/2ML nebulizer solution Take 0.25 mg by nebulization daily.  . famotidine (PEPCID) 20 MG tablet Take 20 mg by mouth daily.  Marland Kitchen ipratropium-albuterol (DUONEB) 0.5-2.5 (3) MG/3ML SOLN Take 3 mLs by nebulization daily.   Marland Kitchen levothyroxine (SYNTHROID, LEVOTHROID) 112 MCG tablet Take 112 mcg by mouth daily.   .  pantoprazole (PROTONIX) 40 MG tablet Take 1 tablet (40 mg total) by mouth daily. Take 30-60 min before first meal of the day  . Polyethyl Glycol-Propyl Glycol (SYSTANE OP) Apply 1 drop to eye as needed. For dry eyes    No facility-administered encounter medications on file as of 03/10/2017.

## 2017-03-31 ENCOUNTER — Telehealth: Payer: Self-pay | Admitting: *Deleted

## 2017-03-31 DIAGNOSIS — I739 Peripheral vascular disease, unspecified: Principal | ICD-10-CM

## 2017-03-31 DIAGNOSIS — E7849 Other hyperlipidemia: Secondary | ICD-10-CM

## 2017-03-31 DIAGNOSIS — I779 Disorder of arteries and arterioles, unspecified: Secondary | ICD-10-CM

## 2017-03-31 NOTE — Telephone Encounter (Signed)
Patient called and requested an refill on atorvastatin 10 mg qd. She stated that this was refilled previously by Dr Einar Gip. She does not have a recent lipid panel. Okay to refill? Please advise. Thanks, MI

## 2017-03-31 NOTE — Telephone Encounter (Signed)
Left message for patient to call back  

## 2017-04-01 MED ORDER — ATORVASTATIN CALCIUM 10 MG PO TABS: 10.0000 mg | ORAL_TABLET | Freq: Every day | ORAL | 3 refills | Status: DC

## 2017-04-01 NOTE — Telephone Encounter (Signed)
Follow up    Pt is calling for Pam

## 2017-04-01 NOTE — Telephone Encounter (Signed)
Called patient about lab work and refill. Patient will come in on 04/12/17 for fasting lab work.

## 2017-04-05 ENCOUNTER — Encounter: Payer: Self-pay | Admitting: Vascular Surgery

## 2017-04-08 ENCOUNTER — Encounter: Payer: Self-pay | Admitting: Vascular Surgery

## 2017-04-08 ENCOUNTER — Ambulatory Visit (INDEPENDENT_AMBULATORY_CARE_PROVIDER_SITE_OTHER): Payer: Medicare Other | Admitting: Vascular Surgery

## 2017-04-08 VITALS — BP 130/64 | HR 60 | Temp 96.9°F | Resp 16 | Ht 62.0 in | Wt 147.0 lb

## 2017-04-08 DIAGNOSIS — I83892 Varicose veins of left lower extremities with other complications: Secondary | ICD-10-CM

## 2017-04-08 DIAGNOSIS — I868 Varicose veins of other specified sites: Secondary | ICD-10-CM

## 2017-04-08 NOTE — Progress Notes (Signed)
     Laser Ablation Procedure    Date: 04/08/2017   Sharon Mcdaniel DOB:1935/11/29  Consent signed: Yes    Surgeon:  Dr. Sherren Mocha Gisselle Galvis  Procedure: Laser Ablation: left Small Saphenous Vein  BP 130/64 (BP Location: Right Arm, Patient Position: Sitting, Cuff Size: Normal)   Pulse 60   Temp (!) 96.9 F (36.1 C)   Resp 16   Ht 5\' 2"  (1.575 m)   Wt 147 lb (66.7 kg)   SpO2 100%   BMI 26.89 kg/m   Tumescent Anesthesia: 250 cc 0.9% NaCl with 50 cc Lidocaine HCL with 1% Epi and 15 cc 8.4% NaHCO3  Local Anesthesia: 2 cc Lidocaine HCL and NaHCO3 (ratio 2:1)  15 watts continuous mode        Total energy: 539.34   Total time: :36    Stab Phlebectomy: 5 Sites: Calf  Patient tolerated procedure well  Notes:   Description of Procedure:  After marking the course of the secondary varicosities, the patient was placed on the operating table in the prone position, and the left leg was prepped and draped in sterile fashion.   Local anesthetic was administered and under ultrasound guidance the saphenous vein was accessed with a micro needle and guide wire; then the mirco puncture sheath was placed.  A guide wire was inserted saphenopopliteal junction , followed by a 5 french sheath.  The position of the sheath and then the laser fiber below the junction was confirmed using the ultrasound.  Tumescent anesthesia was administered along the course of the saphenous vein using ultrasound guidance. The patient was placed in Trendelenburg position and protective laser glasses were placed on patient and staff, and the laser was fired at 15 watts continuous mode advancing 1-54mm/second for a total of 539.34 joules.   For stab phlebectomies, local anesthetic was administered at the previously marked varicosities, and tumescent anesthesia was administered around the vessels.  Five stab wounds were made using the tip of an 11 blade. And using the vein hook, the phlebectomies were performed using a hemostat to avulse  the varicosities.  Adequate hemostasis was achieved.     Steri strips were applied to the stab wounds and ABD pads and thigh high compression stockings were applied.  Ace wrap bandages were applied over the phlebectomy sites and at the top of the saphenopopliteal junction. Blood loss was less than 15 cc.  The patient ambulated out of the operating room having tolerated the procedure well.

## 2017-04-09 ENCOUNTER — Encounter: Payer: Self-pay | Admitting: Vascular Surgery

## 2017-04-12 ENCOUNTER — Other Ambulatory Visit: Payer: Medicare Other

## 2017-04-13 ENCOUNTER — Other Ambulatory Visit: Payer: Self-pay

## 2017-04-13 NOTE — Addendum Note (Signed)
Addended by: Lianne Cure A on: 04/13/2017 09:31 AM   Modules accepted: Orders

## 2017-04-15 ENCOUNTER — Ambulatory Visit (HOSPITAL_COMMUNITY)
Admission: RE | Admit: 2017-04-15 | Discharge: 2017-04-15 | Disposition: A | Discharge status: Home / Self Care | Payer: Medicare Other | Source: Ambulatory Visit | Attending: Vascular Surgery | Admitting: Vascular Surgery

## 2017-04-15 ENCOUNTER — Encounter (HOSPITAL_COMMUNITY): Payer: Medicare Other

## 2017-04-15 ENCOUNTER — Ambulatory Visit (INDEPENDENT_AMBULATORY_CARE_PROVIDER_SITE_OTHER): Payer: Medicare Other | Admitting: Vascular Surgery

## 2017-04-15 ENCOUNTER — Ambulatory Visit: Payer: Medicare Other | Admitting: Vascular Surgery

## 2017-04-15 ENCOUNTER — Encounter: Payer: Self-pay | Admitting: Vascular Surgery

## 2017-04-15 ENCOUNTER — Other Ambulatory Visit: Payer: Medicare Other | Admitting: *Deleted

## 2017-04-15 VITALS — BP 145/68 | HR 55 | Temp 97.4°F | Resp 16 | Ht 62.0 in | Wt 149.0 lb

## 2017-04-15 DIAGNOSIS — E7849 Other hyperlipidemia: Secondary | ICD-10-CM

## 2017-04-15 DIAGNOSIS — I83892 Varicose veins of left lower extremities with other complications: Secondary | ICD-10-CM

## 2017-04-15 DIAGNOSIS — E784 Other hyperlipidemia: Secondary | ICD-10-CM | POA: Diagnosis not present

## 2017-04-15 DIAGNOSIS — I739 Peripheral vascular disease, unspecified: Principal | ICD-10-CM

## 2017-04-15 DIAGNOSIS — I779 Disorder of arteries and arterioles, unspecified: Secondary | ICD-10-CM

## 2017-04-15 LAB — LIPID PANEL
CHOLESTEROL TOTAL: 139 mg/dL (ref 100–199)
Chol/HDL Ratio: 1.6 ratio (ref 0.0–4.4)
HDL: 85 mg/dL (ref >39)
LDL Calculated: 41 mg/dL (ref 0–99)
Triglycerides: 66 mg/dL (ref 0–149)
VLDL CHOLESTEROL CAL: 13 mg/dL (ref 5–40)

## 2017-04-15 LAB — HEPATIC FUNCTION PANEL
ALK PHOS: 88 IU/L (ref 39–117)
ALT: 13 IU/L (ref 0–32)
AST: 14 IU/L (ref 0–40)
Albumin: 3.9 g/dL (ref 3.5–4.8)
BILIRUBIN TOTAL: 0.3 mg/dL (ref 0.0–1.2)
BILIRUBIN, DIRECT: 0.08 mg/dL (ref 0.00–0.40)
Total Protein: 5.9 g/dL — ABNORMAL LOW (ref 6.0–8.5)

## 2017-04-15 NOTE — Progress Notes (Signed)
[facility] [place]  Patient name: Sharon Mcdaniel MRN: [id number] DOB: 1936/07/22 Sex: female  REASON FOR VISIT: One-week follow-up of left small saphenous vein laser ablation and phlebectomy of several tributaries just below this  HPI: Sharon Mcdaniel is a 81 y.o. female here for follow-up. Did extremely well. Reports minimal discomfort associated with her ablation. Has been compliant with her compression  Past Medical History:  Diagnosis Date  . COPD (chronic obstructive pulmonary disease) ([facility])     Family History  Problem Relation Age of Onset  . Lung cancer Mother   . Rheum arthritis Mother   . Asthma Father   . Heart disease Father     SOCIAL HISTORY: Social History  Substance Use Topics  . Smoking status: Former Smoker    Packs/day: 1.00    Years: 30.00    Quit date: 11/03/2003  . Smokeless tobacco: Never Used  . Alcohol use No    Allergies  Allergen Reactions  . Fluconazole In Dextrose Hives and Shortness Of Breath  . Gatifloxacin Hives and Shortness Of Breath  . Iodinated Diagnostic Agents Hives and Shortness Of Breath  . Ioxaglate Hives and Shortness Of Breath  . Quinolones Hives and Shortness Of Breath  . Tequin Hives and Shortness Of Breath  . Zithromax [Azithromycin]     GI Upset  . Codeine Other (See Comments)    Makes her jittery     Current Outpatient Prescriptions  Medication Sig Dispense Refill  . atorvastatin (LIPITOR) 10 MG tablet Take 1 tablet (10 mg total) by mouth daily at 6 PM. Unsure of strength 90 tablet 3  . budesonide (PULMICORT) 0.25 MG/2ML nebulizer solution Take 0.25 mg by nebulization daily.    . famotidine (PEPCID) 20 MG tablet Take 20 mg by mouth daily.    Marland Kitchen ipratropium-albuterol (DUONEB) 0.5-2.5 (3) MG/3ML SOLN Take 3 mLs by nebulization daily.     Marland Kitchen levothyroxine (SYNTHROID, LEVOTHROID) 112 MCG tablet Take 112 mcg by mouth daily.     . pantoprazole (PROTONIX) 40 MG tablet  Take 1 tablet (40 mg total) by mouth daily. Take 30-60 min before first meal of the day 30 tablet 2  . Polyethyl Glycol-Propyl Glycol (SYSTANE OP) Apply 1 drop to eye as needed. For dry eyes      No current facility-administered medications for this visit.     REVIEW OF SYSTEMS:  [X]  denotes positive finding, [ ]  denotes negative finding Cardiac  Comments:  Chest pain or chest pressure:    Shortness of breath upon exertion:    Short of breath when lying flat:    Irregular heart rhythm:        Vascular    Pain in calf, thigh, or hip brought on by ambulation:    Pain in feet at night that wakes you up from your sleep:     Blood clot in your veins:    Leg swelling:           PHYSICAL EXAM: Vitals:   04/15/17 1000 04/15/17 1001  BP: (!) 149/69 (!) 145/68  Pulse: (!) 55   Resp: 16   Temp: 97.4 F (36.3 C)   SpO2: 98%   Weight: 149 lb (67.6 kg)   Height: 5\' 2"  (1.575 m)     GENERAL: The patient is a well-nourished female, in no acute distress. The vital signs are documented above. CARDIOVASCULAR: Palpable dorsalis pedis pulse. PULMONARY: There is good air exchange  MUSCULOSKELETAL: There are no major  deformities or cyanosis. NEUROLOGIC: No focal weakness or paresthesias are detected. SKIN: There are no ulcers or rashes noted. PSYCHIATRIC: The patient has a normal affect. Minimal bruising in her posterior calf on the left. Does have a Steri-Strips intact over the stab phlebectomy sites. Minimal erythema.  DATA:  Duplex shows closure of her small saphenous vein to approximate 4 cm below her saphenofemoral junction. No evidence of DVT  MEDICAL ISSUES: Successful ablation of her small saphenous vein removal of tributary of painful varicosities. She reports that she has had symptom relief already. We will continue her usual activities and will wear compression for one additional week and then as needed. Will see [place] on as-needed basis    Rosetta Posner, MD [facility] Tel [phone] Pager [phone]

## 2017-04-20 ENCOUNTER — Telehealth: Payer: Self-pay

## 2017-04-20 ENCOUNTER — Ambulatory Visit (HOSPITAL_COMMUNITY)
Admission: EM | Admit: 2017-04-20 | Discharge: 2017-04-20 | Disposition: A | Discharge status: Home / Self Care | Payer: Medicare Other | Attending: Internal Medicine | Admitting: Internal Medicine

## 2017-04-20 ENCOUNTER — Encounter (HOSPITAL_COMMUNITY): Payer: Self-pay | Admitting: Family Medicine

## 2017-04-20 DIAGNOSIS — S80861A Insect bite (nonvenomous), right lower leg, initial encounter: Secondary | ICD-10-CM | POA: Diagnosis not present

## 2017-04-20 DIAGNOSIS — W57XXXA Bitten or stung by nonvenomous insect and other nonvenomous arthropods, initial encounter: Secondary | ICD-10-CM

## 2017-04-20 DIAGNOSIS — I779 Disorder of arteries and arterioles, unspecified: Secondary | ICD-10-CM

## 2017-04-20 DIAGNOSIS — Z79899 Other long term (current) drug therapy: Secondary | ICD-10-CM

## 2017-04-20 DIAGNOSIS — I739 Peripheral vascular disease, unspecified: Principal | ICD-10-CM

## 2017-04-20 MED ORDER — DOXYCYCLINE HYCLATE 100 MG PO CAPS: 100.0000 mg | ORAL_CAPSULE | Freq: Two times a day (BID) | ORAL | 0 refills | Status: DC

## 2017-04-20 MED ORDER — TRIAMCINOLONE ACETONIDE 0.5 % EX CREA: 1.0000 "application " | TOPICAL_CREAM | Freq: Three times a day (TID) | CUTANEOUS | 0 refills | Status: DC

## 2017-04-20 NOTE — Telephone Encounter (Signed)
Patient aware of lab results. Per Dr. Johnsie Cancel, Cholesterol is at goal and LFT's are normal F/U labs in 6 months. Patient will have repeat fasting lab work on 10/15/17. Patient verbalized understanding.

## 2017-04-20 NOTE — ED Provider Notes (Signed)
CSN: [id number]     Arrival date & time 04/20/17  1718 History   First MD Initiated Contact with Patient 04/20/17 1737     Chief Complaint  Patient presents with  . Insect Bite   (Consider location/radiation/quality/duration/timing/severity/associated sxs/prior Treatment) WILLAMINA GRIESHOP is a 81 y.o. female who presents to the [facility] urgent care with a chief complaint of insect bite to the right lower leg occurring yesterday. She is unaware of what type of insect bit her. She has no fever, chills, nausea, vomiting, any swelling in her legs, or other associated symptoms, she does however have itching at the site of the bite. Denies any pain, no swelling at the site, however it is red. No history of diabetes, or other vascular issues compromising circulation. Family, and social history is noncontributory.   The history is provided by the patient.    Past Medical History:  Diagnosis Date  . COPD (chronic obstructive pulmonary disease) ([place])    Past Surgical History:  Procedure Laterality Date  . ABDOMINAL HYSTERECTOMY  1980  . BACK SURGERY  07/2011   synovial cyst removal  . TUBAL LIGATION  1970   Family History  Problem Relation Age of Onset  . Lung cancer Mother   . Rheum arthritis Mother   . Asthma Father   . Heart disease Father    Social History  Substance Use Topics  . Smoking status: Former Smoker    Packs/day: 1.00    Years: 30.00    Quit date: 11/03/2003  . Smokeless tobacco: Never Used  . Alcohol use No   OB History    No data available     Review of Systems  Constitutional: Negative.   HENT: Negative.   Respiratory: Negative.   Cardiovascular: Negative.   Gastrointestinal: Negative.   Musculoskeletal: Negative.   Skin: Positive for color change and rash.  Neurological: Negative.     Allergies  Fluconazole in dextrose; Gatifloxacin; Iodinated diagnostic agents; Ioxaglate; Quinolones; Tequin; Zithromax [azithromycin]; and Codeine  Home Medications    Prior to Admission medications   Medication Sig Start Date End Date Taking? Authorizing Provider  atorvastatin (LIPITOR) 10 MG tablet Take 1 tablet (10 mg total) by mouth daily at 6 PM. Unsure of strength 04/01/17   Josue Hector, MD  budesonide (PULMICORT) 0.25 MG/2ML nebulizer solution Take 0.25 mg by nebulization daily.    [provider]  doxycycline (VIBRAMYCIN) 100 MG capsule Take 1 capsule (100 mg total) by mouth 2 (two) times daily. 04/20/17   Barnet Glasgow, NP  famotidine (PEPCID) 20 MG tablet Take 20 mg by mouth daily.    [provider]  ipratropium-albuterol (DUONEB) 0.5-2.5 (3) MG/3ML SOLN Take 3 mLs by nebulization daily.     [provider]  levothyroxine (SYNTHROID, LEVOTHROID) 112 MCG tablet Take 112 mcg by mouth daily.     [provider]  pantoprazole (PROTONIX) 40 MG tablet Take 1 tablet (40 mg total) by mouth daily. Take 30-60 min before first meal of the day 07/08/16   Tanda Rockers, MD  Polyethyl Glycol-Propyl Glycol (SYSTANE OP) Apply 1 drop to eye as needed. For dry eyes     [provider]  triamcinolone cream (KENALOG) 0.5 % Apply 1 application topically 3 (three) times daily. 04/20/17   Barnet Glasgow, NP   Meds Ordered and Administered this Visit  Medications - No data to display  BP (!) 156/65   Pulse 64   Temp 97.5 F (36.4 C)  Resp 18   SpO2 96%  No data found.   Physical Exam  Constitutional: She is oriented to person, place, and time. She appears well-developed and well-nourished. No distress.  HENT:  Head: Normocephalic and atraumatic.  Right Ear: External ear normal.  Left Ear: External ear normal.  Eyes: Conjunctivae are normal.  Neck: Normal range of motion.  Cardiovascular: Normal rate and regular rhythm.   Pulmonary/Chest: Effort normal and breath sounds normal.  Neurological: She is alert and oriented to person, place, and time.  Skin: Skin is warm and dry. Capillary refill takes less  than 2 seconds. Rash noted. She is not diaphoretic. No erythema.     Proximately 3-4 cm in diameter erythemic lesion posterior aspect of the right calf, no scale noted, is not warm to touch, no swelling noted.  Psychiatric: She has a normal mood and affect. Her behavior is normal.  Nursing note and vitals reviewed.   Urgent Care Course     Procedures (including critical care time)  Labs Review Labs Reviewed - No data to display  Imaging Review No results found.    MDM   1. Insect bite, initial encounter    LIVIE VANDERHOOF is a 81 y.o. female who presents to the [facility] urgent care with a chief complaint of insect bite to the right lower leg occurring yesterday. She is unaware of what type of insect bit her. She has no fever, chills, nausea, vomiting, any swelling in her legs, or other associated symptoms, she does however have itching at the site of the bite. Denies any pain, no swelling at the site, however it is red. No history of diabetes, or other vascular issues compromising circulation. Family, and social history is noncontributory.   Based on the signs, symptoms, physical exam, the likelihood of cellulitis is low, however patient given delayed fill prescription for doxycycline, however instructed not to fill it unless necessary. She was given follow-up guidelines on the use of this medicine. For the rash, given topical triamcinolone, apply 2-3 times daily as needed, follow-up with primary care return to clinic as needed. Otherwise given instructions if there is any presence of red streaking, to go to the ER.       Barnet Glasgow, NP 04/20/17 1800

## 2017-04-20 NOTE — Discharge Instructions (Signed)
For your insect bite, prescribed topical triamcinolone, apply to the affected area 2-3 times a day as needed. We have marked the outline of inflammation on your skin, if he sees spreading, start the antibiotics I have prescribed. If at any time you see red streaking up your legs, go to the emergency room as soon as possible.

## 2017-04-20 NOTE — ED Triage Notes (Signed)
Pt here for insect bite to RLE that is erythematous.

## 2017-04-20 NOTE — Telephone Encounter (Signed)
-----   Message from Josue Hector, MD sent at 04/16/2017  2:20 PM EDT ----- Cholesterol is at goal and LFT's are normal F/U labs in 6 months

## 2017-05-31 DIAGNOSIS — R918 Other nonspecific abnormal finding of lung field: Secondary | ICD-10-CM | POA: Diagnosis not present

## 2017-05-31 DIAGNOSIS — Z780 Asymptomatic menopausal state: Secondary | ICD-10-CM | POA: Diagnosis not present

## 2017-05-31 DIAGNOSIS — W57XXXA Bitten or stung by nonvenomous insect and other nonvenomous arthropods, initial encounter: Secondary | ICD-10-CM | POA: Diagnosis not present

## 2017-05-31 DIAGNOSIS — Z0000 Encounter for general adult medical examination without abnormal findings: Secondary | ICD-10-CM | POA: Diagnosis not present

## 2017-07-07 DIAGNOSIS — M19041 Primary osteoarthritis, right hand: Secondary | ICD-10-CM | POA: Diagnosis not present

## 2017-07-07 DIAGNOSIS — R21 Rash and other nonspecific skin eruption: Secondary | ICD-10-CM | POA: Diagnosis not present

## 2017-07-07 DIAGNOSIS — M79643 Pain in unspecified hand: Secondary | ICD-10-CM | POA: Diagnosis not present

## 2017-07-07 DIAGNOSIS — M19042 Primary osteoarthritis, left hand: Secondary | ICD-10-CM | POA: Diagnosis not present

## 2017-07-07 DIAGNOSIS — M2560 Stiffness of unspecified joint, not elsewhere classified: Secondary | ICD-10-CM | POA: Diagnosis not present

## 2017-07-07 DIAGNOSIS — M2550 Pain in unspecified joint: Secondary | ICD-10-CM | POA: Diagnosis not present

## 2017-07-08 ENCOUNTER — Other Ambulatory Visit: Payer: Self-pay | Admitting: Internal Medicine

## 2017-07-08 DIAGNOSIS — R918 Other nonspecific abnormal finding of lung field: Secondary | ICD-10-CM

## 2017-07-19 ENCOUNTER — Other Ambulatory Visit: Payer: Medicare Other

## 2017-07-21 DIAGNOSIS — M2550 Pain in unspecified joint: Secondary | ICD-10-CM | POA: Diagnosis not present

## 2017-07-21 DIAGNOSIS — M79643 Pain in unspecified hand: Secondary | ICD-10-CM | POA: Diagnosis not present

## 2017-07-21 DIAGNOSIS — M1990 Unspecified osteoarthritis, unspecified site: Secondary | ICD-10-CM | POA: Diagnosis not present

## 2017-07-21 DIAGNOSIS — M2560 Stiffness of unspecified joint, not elsewhere classified: Secondary | ICD-10-CM | POA: Diagnosis not present

## 2017-07-21 DIAGNOSIS — M8580 Other specified disorders of bone density and structure, unspecified site: Secondary | ICD-10-CM | POA: Diagnosis not present

## 2017-09-14 DIAGNOSIS — R102 Pelvic and perineal pain: Secondary | ICD-10-CM | POA: Diagnosis not present

## 2017-09-14 DIAGNOSIS — Z01419 Encounter for gynecological examination (general) (routine) without abnormal findings: Secondary | ICD-10-CM | POA: Diagnosis not present

## 2017-09-14 DIAGNOSIS — Z23 Encounter for immunization: Secondary | ICD-10-CM | POA: Diagnosis not present

## 2017-09-14 DIAGNOSIS — K5900 Constipation, unspecified: Secondary | ICD-10-CM | POA: Diagnosis not present

## 2017-09-28 DIAGNOSIS — M1990 Unspecified osteoarthritis, unspecified site: Secondary | ICD-10-CM | POA: Diagnosis not present

## 2017-09-28 DIAGNOSIS — M8580 Other specified disorders of bone density and structure, unspecified site: Secondary | ICD-10-CM | POA: Diagnosis not present

## 2017-09-28 DIAGNOSIS — M79643 Pain in unspecified hand: Secondary | ICD-10-CM | POA: Diagnosis not present

## 2017-09-28 DIAGNOSIS — M2550 Pain in unspecified joint: Secondary | ICD-10-CM | POA: Diagnosis not present

## 2017-09-28 DIAGNOSIS — M2560 Stiffness of unspecified joint, not elsewhere classified: Secondary | ICD-10-CM | POA: Diagnosis not present

## 2017-09-29 ENCOUNTER — Encounter: Payer: Self-pay | Admitting: Cardiovascular Disease

## 2017-09-29 DIAGNOSIS — E039 Hypothyroidism, unspecified: Secondary | ICD-10-CM | POA: Diagnosis not present

## 2017-09-29 DIAGNOSIS — N390 Urinary tract infection, site not specified: Secondary | ICD-10-CM | POA: Diagnosis not present

## 2017-09-29 DIAGNOSIS — Z0000 Encounter for general adult medical examination without abnormal findings: Secondary | ICD-10-CM | POA: Diagnosis not present

## 2017-10-04 DIAGNOSIS — E039 Hypothyroidism, unspecified: Secondary | ICD-10-CM | POA: Diagnosis not present

## 2017-10-04 DIAGNOSIS — E7800 Pure hypercholesterolemia, unspecified: Secondary | ICD-10-CM | POA: Diagnosis not present

## 2017-10-04 DIAGNOSIS — Z0000 Encounter for general adult medical examination without abnormal findings: Secondary | ICD-10-CM | POA: Diagnosis not present

## 2017-10-04 NOTE — Progress Notes (Signed)
Cardiology Office Note   Date:  10/06/2017   ID:  KHANDI KERNES, DOB February 19, 1936, MRN [id number]  PCP:  Jani Gravel, MD  Cardiologist:   Jenkins Rouge, MD   No chief complaint on file.     History of Present Illness: Sharon Mcdaniel is a 81 y.o. female who presents for cardiology evaluation. 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  Quit smoking 2005 CT 08/06/17 with pleural -parenchymal scarring and sub pleural consolidation stable.   She is my wife's stepmother's mother  Having issues with arthritis especially in right hand   Past Medical History:  Diagnosis Date  . COPD (chronic obstructive pulmonary disease) ([place])     Past Surgical History:  Procedure Laterality Date  . ABDOMINAL HYSTERECTOMY  1980  . BACK SURGERY  07/2011   synovial cyst removal  . TUBAL LIGATION  1970     Current Outpatient Medications  Medication Sig Dispense Refill  . atorvastatin (LIPITOR) 10 MG tablet Take 1 tablet (10 mg total) by mouth daily at 6 PM. Unsure of strength 90 tablet 3  . budesonide (PULMICORT) 0.25 MG/2ML nebulizer solution Take 0.25 mg by nebulization daily.    . famotidine (PEPCID) 20 MG tablet Take 20 mg by mouth daily.    . Hydroxychloroquine Sulfate (PLAQUENIL PO) Take 1 tablet by mouth daily.    Marland Kitchen ipratropium-albuterol (DUONEB) 0.5-2.5 (3) MG/3ML SOLN Take 3 mLs by nebulization daily.     Marland Kitchen levothyroxine (SYNTHROID, LEVOTHROID) 112 MCG tablet Take 112 mcg by mouth daily.     Marland Kitchen triamcinolone cream (KENALOG) 0.5 % Apply 1 application topically 3 (three) times daily. 30 g 0   No current facility-administered medications for this visit.       Allergies:   Fluconazole in dextrose; Gatifloxacin; Iodinated diagnostic agents; Ioxaglate; Quinolones; Tequin; Zithromax [azithromycin]; and Codeine    Social History:  The patient  reports that she quit smoking about 13 years ago. She has a 30.00 pack-year smoking history. she has never used smokeless tobacco. She reports that she does not drink alcohol or use drugs.   Family History:  The patient's family history includes Asthma in her father; Heart disease in her father; Lung cancer in her mother; Rheum arthritis in her mother.    ROS:  Please see the history of present illness.   Otherwise, review of systems are positive for none.   All other systems are reviewed and negative.    PHYSICAL EXAM: VS:  BP 130/64   Pulse 64   Ht 5\' 2"  (1.575 m)   Wt 144 lb 8 oz (65.5 kg)   SpO2 95%   BMI 26.43 kg/m  , BMI Body mass index is 26.43 kg/m. Affect appropriate Healthy:  appears stated age 28: normal Neck supple with no adenopathy JVP normal no bruits no thyromegaly Lungs clear with no wheezing and good diaphragmatic motion Heart:  S1/S2 no murmur, no rub, gallop or click PMI normal Abdomen: benighn, BS positve, no tenderness, no AAA no bruit.  No HSM or HJR Distal pulses intact with no bruits Plus one bilateral edema worse on left with varicosities  Neuro non-focal Skin warm and dry No muscular weakness     EKG:   NSR mild LAE normal 10/06/17 NSR rate 58 LAE normal   Recent Labs: 10/05/2017: ALT 13    Lipid Panel    Component Value Date/Time   CHOL 144 10/05/2017 1534   TRIG 90 10/05/2017 1534   HDL 87 10/05/2017 1534   CHOLHDL 1.7 10/05/2017 1534   LDLCALC 39 10/05/2017 1534      Wt Readings from Last 3 Encounters:  10/06/17 144 lb 8 oz (65.5 kg)  04/15/17 149 lb (67.6 kg)  04/08/17 147 lb (66.7 kg)      Other studies Reviewed: Additional studies/ records that were reviewed today include: Notes Dr Einar Gip Echo and duplex .    ASSESSMENT AND  PLAN:  1.  Facial Paresthesia etiology not clear but low likelyhood of TIA doubt PAF resolved 2.  Left Carotid Bruit last duplex plaque no stenosis will repeat 08/2018 3. Thyroid  Euthyroid continue current replacement dose TSH with primary  4. GERD low carb diet pepcid  5. Varicose Veins:  Left [place] no DVT 09/30/16 f/u [facility]  6. COPD:  F/u [facility] CT October 2017 improved    Current medicines are reviewed at length with the patient today.  The patient does not have concerns regarding medicines.  The following changes have been made:  no change  Labs/ tests ordered today include: Carotid Duplex   Orders Placed This Encounter  Procedures  . EKG 12-Lead     Disposition:   FU with me in a year      Signed, Jenkins Rouge, MD  10/06/2017 4:25 PM    [facility] [place], [place], [place]  [address] Phone: [phone]; Fax: [phone]

## 2017-10-05 ENCOUNTER — Other Ambulatory Visit: Payer: Medicare Other

## 2017-10-05 DIAGNOSIS — Z79899 Other long term (current) drug therapy: Secondary | ICD-10-CM | POA: Diagnosis not present

## 2017-10-05 DIAGNOSIS — I739 Peripheral vascular disease, unspecified: Principal | ICD-10-CM

## 2017-10-05 DIAGNOSIS — I779 Disorder of arteries and arterioles, unspecified: Secondary | ICD-10-CM

## 2017-10-06 ENCOUNTER — Encounter: Payer: Self-pay | Admitting: Cardiovascular Disease

## 2017-10-06 ENCOUNTER — Telehealth: Payer: Self-pay | Admitting: Cardiovascular Disease

## 2017-10-06 ENCOUNTER — Ambulatory Visit (INDEPENDENT_AMBULATORY_CARE_PROVIDER_SITE_OTHER): Payer: Medicare Other | Admitting: Cardiovascular Disease

## 2017-10-06 VITALS — BP 130/64 | HR 64 | Ht 62.0 in | Wt 144.5 lb

## 2017-10-06 DIAGNOSIS — E7849 Other hyperlipidemia: Secondary | ICD-10-CM | POA: Diagnosis not present

## 2017-10-06 DIAGNOSIS — I739 Peripheral vascular disease, unspecified: Secondary | ICD-10-CM

## 2017-10-06 DIAGNOSIS — I779 Disorder of arteries and arterioles, unspecified: Secondary | ICD-10-CM | POA: Diagnosis not present

## 2017-10-06 DIAGNOSIS — R0989 Other specified symptoms and signs involving the circulatory and respiratory systems: Secondary | ICD-10-CM | POA: Diagnosis not present

## 2017-10-06 LAB — LIPID PANEL
CHOL/HDL RATIO: 1.7 ratio (ref 0.0–4.4)
Cholesterol, Total: 144 mg/dL (ref 100–199)
HDL: 87 mg/dL (ref >39)
LDL CALC: 39 mg/dL (ref 0–99)
Triglycerides: 90 mg/dL (ref 0–149)
VLDL Cholesterol Cal: 18 mg/dL (ref 5–40)

## 2017-10-06 LAB — HEPATIC FUNCTION PANEL
ALBUMIN: 3.9 g/dL (ref 3.5–4.8)
ALK PHOS: 83 IU/L (ref 39–117)
ALT: 13 IU/L (ref 0–32)
AST: 19 IU/L (ref 0–40)
BILIRUBIN, DIRECT: 0.09 mg/dL (ref 0.00–0.40)
Bilirubin Total: 0.3 mg/dL (ref 0.0–1.2)
TOTAL PROTEIN: 5.9 g/dL — AB (ref 6.0–8.5)

## 2017-10-06 NOTE — Telephone Encounter (Signed)
ROI faxed to [facility].

## 2017-10-06 NOTE — Patient Instructions (Addendum)
Medication Instructions:  Your physician recommends that you continue on your current medications as directed. Please refer to the Current Medication list given to you today.  Labwork: NONE  Testing/Procedures: Your physician has requested that you have a carotid duplex in October. This test is an ultrasound of the carotid arteries in your neck. It looks at blood flow through these arteries that supply the brain with blood. Allow one hour for this exam. There are no restrictions or special instructions.  Follow-Up: Your physician wants you to follow-up in: 12 months with Dr. Johnsie Cancel. You will receive a reminder letter in the mail two months in advance. If you don't receive a letter, please call our office to schedule the follow-up appointment.   If you need a refill on your cardiac medications before your next appointment, please call your pharmacy.

## 2017-10-07 ENCOUNTER — Telehealth: Payer: Self-pay | Admitting: Cardiovascular Disease

## 2017-10-07 NOTE — Telephone Encounter (Signed)
Records received from [facility] placed in Chart prep.

## 2017-10-15 ENCOUNTER — Other Ambulatory Visit: Payer: Medicare Other

## 2017-10-18 DIAGNOSIS — M1990 Unspecified osteoarthritis, unspecified site: Secondary | ICD-10-CM | POA: Diagnosis not present

## 2017-10-18 DIAGNOSIS — G5600 Carpal tunnel syndrome, unspecified upper limb: Secondary | ICD-10-CM | POA: Diagnosis not present

## 2017-10-18 DIAGNOSIS — M2550 Pain in unspecified joint: Secondary | ICD-10-CM | POA: Diagnosis not present

## 2017-10-18 DIAGNOSIS — M8580 Other specified disorders of bone density and structure, unspecified site: Secondary | ICD-10-CM | POA: Diagnosis not present

## 2017-10-18 DIAGNOSIS — M25531 Pain in right wrist: Secondary | ICD-10-CM | POA: Diagnosis not present

## 2017-10-18 DIAGNOSIS — M79643 Pain in unspecified hand: Secondary | ICD-10-CM | POA: Diagnosis not present

## 2017-11-01 DIAGNOSIS — G5600 Carpal tunnel syndrome, unspecified upper limb: Secondary | ICD-10-CM | POA: Diagnosis not present

## 2017-11-01 DIAGNOSIS — M2550 Pain in unspecified joint: Secondary | ICD-10-CM | POA: Diagnosis not present

## 2017-11-01 DIAGNOSIS — M1990 Unspecified osteoarthritis, unspecified site: Secondary | ICD-10-CM | POA: Diagnosis not present

## 2017-11-01 DIAGNOSIS — M79643 Pain in unspecified hand: Secondary | ICD-10-CM | POA: Diagnosis not present

## 2017-11-01 DIAGNOSIS — M8580 Other specified disorders of bone density and structure, unspecified site: Secondary | ICD-10-CM | POA: Diagnosis not present

## 2017-11-01 DIAGNOSIS — M25531 Pain in right wrist: Secondary | ICD-10-CM | POA: Diagnosis not present

## 2017-11-01 DIAGNOSIS — M189 Osteoarthritis of first carpometacarpal joint, unspecified: Secondary | ICD-10-CM | POA: Diagnosis not present

## 2017-12-14 DIAGNOSIS — M2550 Pain in unspecified joint: Secondary | ICD-10-CM | POA: Diagnosis not present

## 2017-12-14 DIAGNOSIS — M25531 Pain in right wrist: Secondary | ICD-10-CM | POA: Diagnosis not present

## 2017-12-14 DIAGNOSIS — M1990 Unspecified osteoarthritis, unspecified site: Secondary | ICD-10-CM | POA: Diagnosis not present

## 2017-12-14 DIAGNOSIS — M79643 Pain in unspecified hand: Secondary | ICD-10-CM | POA: Diagnosis not present

## 2017-12-14 DIAGNOSIS — M8580 Other specified disorders of bone density and structure, unspecified site: Secondary | ICD-10-CM | POA: Diagnosis not present

## 2017-12-14 DIAGNOSIS — G5600 Carpal tunnel syndrome, unspecified upper limb: Secondary | ICD-10-CM | POA: Diagnosis not present

## 2017-12-22 DIAGNOSIS — Z961 Presence of intraocular lens: Secondary | ICD-10-CM | POA: Diagnosis not present

## 2017-12-22 DIAGNOSIS — D3132 Benign neoplasm of left choroid: Secondary | ICD-10-CM | POA: Diagnosis not present

## 2017-12-22 DIAGNOSIS — H02831 Dermatochalasis of right upper eyelid: Secondary | ICD-10-CM | POA: Diagnosis not present

## 2017-12-22 DIAGNOSIS — H04123 Dry eye syndrome of bilateral lacrimal glands: Secondary | ICD-10-CM | POA: Diagnosis not present

## 2017-12-22 DIAGNOSIS — H26491 Other secondary cataract, right eye: Secondary | ICD-10-CM | POA: Diagnosis not present

## 2017-12-22 DIAGNOSIS — M059 Rheumatoid arthritis with rheumatoid factor, unspecified: Secondary | ICD-10-CM | POA: Diagnosis not present

## 2017-12-22 DIAGNOSIS — Z79899 Other long term (current) drug therapy: Secondary | ICD-10-CM | POA: Diagnosis not present

## 2017-12-22 DIAGNOSIS — H40013 Open angle with borderline findings, low risk, bilateral: Secondary | ICD-10-CM | POA: Diagnosis not present

## 2017-12-22 DIAGNOSIS — H02834 Dermatochalasis of left upper eyelid: Secondary | ICD-10-CM | POA: Diagnosis not present

## 2018-01-11 DIAGNOSIS — H6123 Impacted cerumen, bilateral: Secondary | ICD-10-CM | POA: Diagnosis not present

## 2018-01-11 DIAGNOSIS — M542 Cervicalgia: Secondary | ICD-10-CM | POA: Insufficient documentation

## 2018-01-11 DIAGNOSIS — H9202 Otalgia, left ear: Secondary | ICD-10-CM | POA: Insufficient documentation

## 2018-02-05 DIAGNOSIS — M549 Dorsalgia, unspecified: Secondary | ICD-10-CM | POA: Insufficient documentation

## 2018-02-05 DIAGNOSIS — M545 Low back pain: Secondary | ICD-10-CM | POA: Diagnosis not present

## 2018-02-28 DIAGNOSIS — M533 Sacrococcygeal disorders, not elsewhere classified: Secondary | ICD-10-CM | POA: Diagnosis not present

## 2018-03-07 ENCOUNTER — Other Ambulatory Visit: Payer: Self-pay | Admitting: Cardiovascular Disease

## 2018-03-11 ENCOUNTER — Ambulatory Visit (INDEPENDENT_AMBULATORY_CARE_PROVIDER_SITE_OTHER): Payer: Medicare Other | Admitting: Internal Medicine

## 2018-03-11 ENCOUNTER — Encounter: Payer: Self-pay | Admitting: Internal Medicine

## 2018-03-11 VITALS — BP 110/60 | HR 110 | Ht 62.0 in | Wt 139.4 lb

## 2018-03-11 DIAGNOSIS — J449 Chronic obstructive pulmonary disease, unspecified: Secondary | ICD-10-CM

## 2018-03-11 NOTE — Progress Notes (Signed)
Subjective:     Patient ID: Sharon Mcdaniel, female   DOB: [birth year]      MRN: [id number]   Brief patient profile:   35 yowf quit smoking in 2005 with severe CAP dx as GOLD II copd Sept 2007 and referred to pulmonary clinic 06/10/2016 by Dr Kim/ Thedora Hinders with nl pfts 07/2016 p neb c/w AB not copd   History of Present Illness  06/10/2016 1st [facility] office visit/ Sharon Mcdaniel   Chief Complaint  Patient presents with  . Pulmonary Consult    Pt referred by Dr Epimenio Sarin for COPD/lung mass. Recent PET scan 05/11/16.   onset of new pattern in Dec 2016 with severe cough that comes and goes and a persistent sense of doe that persisteed since them not back to baseline esp Worse in  June 2017 but improved p abx Doe = MMRC1 = can walk nl pace, flat grade, can't hurry or go uphills or steps s sob Since onset of recurrent cough has had unresolved nasal congestion and intermittent purulent nasal drainage correlating with flares of "bronchitis' pred / abx def help cough but keeps coming back sev weeks later  rec  schedule sinus CT> neg 06/15/16  GERD  Diet     03/11/2018  f/u ov/Sharon Mcdaniel re:   AB on pulmocort/duoneb each am only Chief Complaint  Patient presents with  . Follow-up    Breathing is doing well and not coughing. She uses albuterol, atrovent and budesonide each once per day. No new co's.   Dyspnea:  Housework, steps ok/ dog walking some hills  Cough: no Sleep: well SABA use:  No extra rx   No obvious day to day or daytime variability or assoc excess/ purulent sputum or mucus plugs or hemoptysis or cp or chest tightness, subjective wheeze or overt sinus or hb symptoms. No unusual exposure hx or h/o childhood pna/ asthma or knowledge of premature birth.  Sleeping  Ok 1 pillow   without nocturnal  or early am exacerbation  of respiratory  c/o's or need for noct saba. Also denies any obvious fluctuation of symptoms with weather or environmental changes or other aggravating or alleviating  factors except as outlined above   Current Allergies, Complete Past Medical History, Past Surgical History, Family History, and Social History were reviewed in [employer] record.  ROS  The following are not active complaints unless bolded Hoarseness, sore throat, dysphagia, dental problems, itching, sneezing,  nasal congestion or discharge of excess mucus or purulent secretions, ear ache,   fever, chills, sweats, unintended wt loss or wt gain, classically pleuritic or exertional cp,  orthopnea pnd or arm/hand swelling  or leg swelling, presyncope, palpitations, abdominal pain, anorexia, nausea, vomiting, diarrhea  or change in bowel habits or change in bladder habits, change in stools or change in urine, dysuria, hematuria,  rash, arthralgias, visual complaints, headache, numbness, weakness or ataxia or problems with walking or coordination,  change in mood or  memory.        Current Meds  Medication Sig  . albuterol (ACCUNEB) 1.25 MG/3ML nebulizer solution Take 1 ampule by nebulization daily.  Marland Kitchen atorvastatin (LIPITOR) 10 MG tablet TAKE 1 TABLET BY MOUTH AT 6 PM  . budesonide (PULMICORT) 0.25 MG/2ML nebulizer solution Take 0.25 mg by nebulization daily.  . hydroxychloroquine (PLAQUENIL) 200 MG tablet Take 400 mg by mouth daily.  Marland Kitchen ipratropium (ATROVENT) 0.02 % nebulizer solution Take 0.5 mg by nebulization daily.  Marland Kitchen levothyroxine (SYNTHROID, LEVOTHROID) 112 MCG  tablet Take 112 mcg by mouth daily.   Marland Kitchen triamcinolone cream (KENALOG) 0.5 % Apply 1 application topically 3 (three) times daily.               Objective:   Physical Exam   amb robust wf nad  03/11/2018        139  03/10/2017          152   08/14/2016     157   07/08/16 161 lb 12.8 oz (73.4 kg)  06/10/16 164 lb (74.4 kg)  11/03/11 145 lb 8 oz (66 kg)    Vital signs reviewed - Note on arrival 02 sats  93% on RA     03/11/2018  Full set  R > L puffy ankles  Very min rhonci                 Assessment:

## 2018-03-11 NOTE — Patient Instructions (Signed)
No change medications   Please schedule a follow up visit in 12  months but call sooner if needed    

## 2018-03-12 ENCOUNTER — Encounter: Payer: Self-pay | Admitting: Internal Medicine

## 2018-03-12 NOTE — Assessment & Plan Note (Signed)
PFTs July 06, 2006   FEV1 of 58% predicted with a ratio of 56%  And 15% improvement after bronchodilator consistent with an asthmatic component. Allergy profile 06/10/2016 >  Eos 0.1/  IgE 7 RAST neg  - Sinus CT 06/15/2016 > neg    - PFT's  07/08/2016  FEV1 1.45 (79 % ) ratio 74  p 10 % improvement from saba p duoneb > 6 h  prior to study with DLCO  52 % corrects to 71  % for alv volume     All goals of chronic asthma control met including optimal function and elimination of symptoms with minimal need for rescue therapy.  Contingencies discussed in full including contacting this office immediately if not controlling the symptoms using the rule of two's.     Each maintenance medication was reviewed in detail including most importantly the difference between maintenance and as needed and under what circumstances the prns are to be used.  Please see AVS for specific  Instructions which are unique to this visit and I personally typed out  which were reviewed in detail in writing with the patient and a copy provided.    F/u can be yearly, sooner prn  

## 2018-07-07 DIAGNOSIS — J019 Acute sinusitis, unspecified: Secondary | ICD-10-CM | POA: Diagnosis not present

## 2018-07-07 DIAGNOSIS — Z23 Encounter for immunization: Secondary | ICD-10-CM | POA: Diagnosis not present

## 2018-07-13 ENCOUNTER — Other Ambulatory Visit: Payer: Self-pay | Admitting: Internal Medicine

## 2018-07-13 DIAGNOSIS — Z1231 Encounter for screening mammogram for malignant neoplasm of breast: Secondary | ICD-10-CM

## 2018-07-19 DIAGNOSIS — F329 Major depressive disorder, single episode, unspecified: Secondary | ICD-10-CM | POA: Diagnosis not present

## 2018-07-19 DIAGNOSIS — J45909 Unspecified asthma, uncomplicated: Secondary | ICD-10-CM | POA: Diagnosis not present

## 2018-07-19 DIAGNOSIS — R634 Abnormal weight loss: Secondary | ICD-10-CM | POA: Diagnosis not present

## 2018-07-19 DIAGNOSIS — R1900 Intra-abdominal and pelvic swelling, mass and lump, unspecified site: Secondary | ICD-10-CM | POA: Diagnosis not present

## 2018-07-20 DIAGNOSIS — E559 Vitamin D deficiency, unspecified: Secondary | ICD-10-CM | POA: Diagnosis not present

## 2018-07-20 DIAGNOSIS — Z0000 Encounter for general adult medical examination without abnormal findings: Secondary | ICD-10-CM | POA: Diagnosis not present

## 2018-07-20 DIAGNOSIS — R7309 Other abnormal glucose: Secondary | ICD-10-CM | POA: Diagnosis not present

## 2018-07-20 DIAGNOSIS — I10 Essential (primary) hypertension: Secondary | ICD-10-CM | POA: Diagnosis not present

## 2018-08-03 DIAGNOSIS — S348XXA Injury of other nerves at abdomen, lower back and pelvis level, initial encounter: Secondary | ICD-10-CM | POA: Diagnosis not present

## 2018-08-03 DIAGNOSIS — H5462 Unqualified visual loss, left eye, normal vision right eye: Secondary | ICD-10-CM | POA: Diagnosis not present

## 2018-08-03 DIAGNOSIS — F329 Major depressive disorder, single episode, unspecified: Secondary | ICD-10-CM | POA: Diagnosis not present

## 2018-08-03 DIAGNOSIS — R030 Elevated blood-pressure reading, without diagnosis of hypertension: Secondary | ICD-10-CM | POA: Diagnosis not present

## 2018-08-03 DIAGNOSIS — E559 Vitamin D deficiency, unspecified: Secondary | ICD-10-CM | POA: Diagnosis not present

## 2018-08-03 DIAGNOSIS — J45909 Unspecified asthma, uncomplicated: Secondary | ICD-10-CM | POA: Diagnosis not present

## 2018-08-04 ENCOUNTER — Other Ambulatory Visit: Payer: Self-pay | Admitting: Family Medicine

## 2018-08-04 DIAGNOSIS — E2839 Other primary ovarian failure: Secondary | ICD-10-CM

## 2018-08-09 ENCOUNTER — Ambulatory Visit: Payer: Medicare Other

## 2018-08-10 ENCOUNTER — Ambulatory Visit
Admission: RE | Admit: 2018-08-10 | Discharge: 2018-08-10 | Disposition: A | Discharge status: Home / Self Care | Payer: Medicare Other | Source: Ambulatory Visit | Attending: Internal Medicine | Admitting: Internal Medicine

## 2018-08-10 DIAGNOSIS — Z1231 Encounter for screening mammogram for malignant neoplasm of breast: Secondary | ICD-10-CM | POA: Diagnosis not present

## 2018-08-15 ENCOUNTER — Encounter (HOSPITAL_COMMUNITY): Payer: Medicare Other

## 2018-08-15 ENCOUNTER — Ambulatory Visit (HOSPITAL_COMMUNITY)
Admission: RE | Admit: 2018-08-15 | Discharge: 2018-08-15 | Disposition: A | Discharge status: Home / Self Care | Payer: Medicare Other | Source: Ambulatory Visit | Attending: Cardiovascular Disease | Admitting: Cardiovascular Disease

## 2018-08-15 DIAGNOSIS — I739 Peripheral vascular disease, unspecified: Secondary | ICD-10-CM | POA: Diagnosis not present

## 2018-08-15 DIAGNOSIS — E7849 Other hyperlipidemia: Secondary | ICD-10-CM | POA: Insufficient documentation

## 2018-08-15 DIAGNOSIS — R0989 Other specified symptoms and signs involving the circulatory and respiratory systems: Secondary | ICD-10-CM | POA: Diagnosis not present

## 2018-08-15 DIAGNOSIS — I779 Disorder of arteries and arterioles, unspecified: Secondary | ICD-10-CM

## 2018-08-18 ENCOUNTER — Telehealth: Payer: Self-pay

## 2018-08-18 DIAGNOSIS — E785 Hyperlipidemia, unspecified: Secondary | ICD-10-CM

## 2018-08-18 NOTE — Telephone Encounter (Signed)
Left message for pt to call back re: her carotid U/S.

## 2018-08-19 NOTE — Telephone Encounter (Signed)
Patient aware of carotid results. Per Dr. Johnsie Cancel,  Plaque no stenosis f/u carotid duplex in 2 years. Will place order for carotid.

## 2018-08-19 NOTE — Telephone Encounter (Signed)
New Message:      Pt is returning a call for her carotid results

## 2018-08-26 DIAGNOSIS — S348XXA Injury of other nerves at abdomen, lower back and pelvis level, initial encounter: Secondary | ICD-10-CM | POA: Diagnosis not present

## 2018-08-26 DIAGNOSIS — R200 Anesthesia of skin: Secondary | ICD-10-CM | POA: Diagnosis not present

## 2018-08-26 DIAGNOSIS — F329 Major depressive disorder, single episode, unspecified: Secondary | ICD-10-CM | POA: Diagnosis not present

## 2018-08-26 DIAGNOSIS — H9192 Unspecified hearing loss, left ear: Secondary | ICD-10-CM | POA: Diagnosis not present

## 2018-09-20 ENCOUNTER — Other Ambulatory Visit: Payer: Self-pay

## 2018-10-04 DIAGNOSIS — E785 Hyperlipidemia, unspecified: Secondary | ICD-10-CM | POA: Diagnosis not present

## 2018-10-04 DIAGNOSIS — F329 Major depressive disorder, single episode, unspecified: Secondary | ICD-10-CM | POA: Diagnosis not present

## 2018-10-04 DIAGNOSIS — E039 Hypothyroidism, unspecified: Secondary | ICD-10-CM | POA: Diagnosis not present

## 2018-10-04 DIAGNOSIS — R7301 Impaired fasting glucose: Secondary | ICD-10-CM | POA: Diagnosis not present

## 2018-10-18 NOTE — Progress Notes (Signed)
Cardiology Office Note   Date:  10/20/2018   ID:  Sharon Mcdaniel, DOB 1936-03-10, MRN [id number]  PCP:  Hayden Rasmussen, MD  Cardiologist:   Jenkins Rouge, MD   No chief complaint on file.     History of Present Illness: Sharon Mcdaniel is a 82 y.o. female f/u for carotid bruits. Had some facial paresthesias with negative TIA w/u Negative. Last carotid 08/15/18 plaque no stenosis TTE in 2017 normal just mild MR On statin for HLD Sees Dr Melvyn Novas for Gold 32 COPD Quit smoking in 2005 Significant arthritis in right hand   She is my wife's stepmother;s mom  Doing well no cardiac symptoms Active Granddaughter Andee Poles moved to [facility] from [place] With twin boys Arnell Sieving and Juanda Crumble    Past Medical History:  Diagnosis Date  . COPD (chronic obstructive pulmonary disease) ([place])     Past Surgical History:  Procedure Laterality Date  . ABDOMINAL HYSTERECTOMY  1980  . BACK SURGERY  07/2011   synovial cyst removal  . TUBAL LIGATION  1970     Current Outpatient Medications  Medication Sig Dispense Refill  . albuterol (ACCUNEB) 1.25 MG/3ML nebulizer solution Take 1 ampule by nebulization daily.    Marland Kitchen atorvastatin (LIPITOR) 10 MG tablet TAKE 1 TABLET BY MOUTH AT 6 PM 90 tablet 2  . budesonide (PULMICORT) 0.25 MG/2ML nebulizer solution Take 0.25 mg by nebulization daily.    . hydroxychloroquine (PLAQUENIL) 200 MG tablet Take 400 mg by mouth daily.    Marland Kitchen ipratropium (ATROVENT) 0.02 % nebulizer solution Take 0.5 mg by nebulization daily.    Marland Kitchen levothyroxine (SYNTHROID, LEVOTHROID) 112 MCG tablet Take 112 mcg by mouth daily.     Marland Kitchen triamcinolone cream (KENALOG) 0.5 % Apply 1 application topically 3 (three) times daily. 30 g 0   No current facility-administered medications for this visit.     Allergies:   Fluconazole in dextrose; Gatifloxacin; Iodinated diagnostic agents; Ioxaglate; Quinolones; Tequin; Zithromax [azithromycin]; and Codeine    Social History:  The patient  reports  that she quit smoking about 14 years ago. She has a 30.00 pack-year smoking history. She has never used smokeless tobacco. She reports that she does not drink alcohol or use drugs.   Family History:  The patient's family history includes Asthma in her father; Heart disease in her father; Lung cancer in her mother; Rheum arthritis in her mother.    ROS:  Please see the history of present illness.   Otherwise, review of systems are positive for none.   All other systems are reviewed and negative.    PHYSICAL EXAM: VS:  BP 130/80   Pulse (!) 59   Ht 5' 2.5" (1.588 m)   Wt 146 lb 12.8 oz (66.6 kg)   SpO2 99%   BMI 26.42 kg/m  , BMI Body mass index is 26.42 kg/m. Affect appropriate Healthy:  appears stated age 55: normal Neck supple with no adenopathy JVP normal no bruits no thyromegaly Lungs clear with no wheezing and good diaphragmatic motion Heart:  S1/S2 no murmur, no rub, gallop or click PMI normal Abdomen: benighn, BS positve, no tenderness, no AAA no bruit.  No HSM or HJR Distal pulses intact with no bruits Plus one bilateral edema worse on left with varicosities  Neuro non-focal Skin warm and dry No muscular weakness     EKG:   NSR mild LAE normal 10/06/17 NSR rate 58 LAE normal 10/20/18 SR rate 59 LAE otherwise normal  Recent Labs: No results found for requested labs within last 8760 hours.    Lipid Panel    Component Value Date/Time   CHOL 144 10/05/2017 1534   TRIG 90 10/05/2017 1534   HDL 87 10/05/2017 1534   CHOLHDL 1.7 10/05/2017 1534   LDLCALC 39 10/05/2017 1534      Wt Readings from Last 3 Encounters:  10/20/18 146 lb 12.8 oz (66.6 kg)  03/11/18 139 lb 6.4 oz (63.2 kg)  10/06/17 144 lb 8 oz (65.5 kg)      Other studies Reviewed: Additional studies/ records that were reviewed today include: Notes Dr Einar Gip Echo and duplex .    ASSESSMENT AND PLAN:  1.  Facial Paresthesia etiology not clear but low likelyhood of TIA doubt PAF  resolved 2.  Left Carotid Bruit last duplex plaque no stenosis will repeat 08/2019 3. Thyroid  Euthyroid continue current replacement dose TSH with primary  4. GERD low carb diet pepcid  5. Varicose Veins:  Left [place] no DVT 09/30/16 f/u [facility]  6. COPD:  F/u [facility] CT October 2017 improved   7. HLD:  Continue statin labs with primary LDL 39 10/05/17    Current medicines are reviewed at length with the patient today.  The patient does not have concerns regarding medicines.  The following changes have been made:  no change  Labs/ tests ordered today include: Carotid Duplex   No orders of the defined types were placed in this encounter.    Disposition:   FU with me in a year      Signed, Jenkins Rouge, MD  10/20/2018 4:03 PM    [facility] [place], [place], [place]  [address] Phone: [phone]; Fax: [phone]

## 2018-10-20 ENCOUNTER — Ambulatory Visit (INDEPENDENT_AMBULATORY_CARE_PROVIDER_SITE_OTHER): Payer: Medicare Other | Admitting: Cardiovascular Disease

## 2018-10-20 ENCOUNTER — Encounter: Payer: Self-pay | Admitting: Cardiovascular Disease

## 2018-10-20 VITALS — BP 130/80 | HR 59 | Ht 62.5 in | Wt 146.8 lb

## 2018-10-20 DIAGNOSIS — E7849 Other hyperlipidemia: Secondary | ICD-10-CM

## 2018-10-20 DIAGNOSIS — I779 Disorder of arteries and arterioles, unspecified: Secondary | ICD-10-CM

## 2018-10-20 DIAGNOSIS — I739 Peripheral vascular disease, unspecified: Secondary | ICD-10-CM

## 2018-10-20 NOTE — Patient Instructions (Addendum)
Medication Instructions:   If you need a refill on your cardiac medications before your next appointment, please call your pharmacy.   Lab work:  If you have labs (blood work) drawn today and your tests are completely normal, you will receive your results only by: Marland Kitchen MyChart Message (if you have MyChart) OR . A paper copy in the mail If you have any lab test that is abnormal or we need to change your treatment, we will call you to review the results.  Testing/Procedures: Your physician has requested that you have a carotid duplex in October. This test is an ultrasound of the carotid arteries in your neck. It looks at blood flow through these arteries that supply the brain with blood. Allow one hour for this exam. There are no restrictions or special instructions.  Follow-Up: At [facility], you and your health needs are our priority.  As part of our continuing mission to provide you with exceptional heart care, we have created designated Provider Care Teams.  These Care Teams include your primary Cardiologist (physician) and Advanced Practice Providers (APPs -  Physician Assistants and Nurse Practitioners) who all work together to provide you with the care you need, when you need it. You will need a follow up appointment in 1 years.  Please call our office 2 months in advance to schedule this appointment.  You may see Jenkins Rouge, MD or one of the following Advanced Practice Providers on your designated Care Team:   Truitt Merle, NP Cecilie Kicks, NP . Kathyrn Drown, NP

## 2018-10-25 NOTE — Addendum Note (Signed)
Addended by: Rose Phi on: 10/25/2018 08:29 AM   Modules accepted: Orders

## 2018-11-16 ENCOUNTER — Telehealth: Payer: Self-pay | Admitting: Internal Medicine

## 2018-11-16 MED ORDER — IPRATROPIUM BROMIDE 0.02 % IN SOLN: 0.5000 mg | Freq: Every day | RESPIRATORY_TRACT | 3 refills | Status: DC

## 2018-11-16 MED ORDER — BUDESONIDE 0.25 MG/2ML IN SUSP: 0.2500 mg | Freq: Every day | RESPIRATORY_TRACT | 3 refills | Status: DC

## 2018-11-16 MED ORDER — ALBUTEROL SULFATE 1.25 MG/3ML IN NEBU: 1.0000 | INHALATION_SOLUTION | Freq: Every day | RESPIRATORY_TRACT | 3 refills | Status: DC

## 2018-11-16 NOTE — Telephone Encounter (Signed)
Called and spoke with patient, she stated that she is needing a refill on Budesonide, ipratropium, and albuterol. Refills sent to verified pharmacy. Nothing further needed.

## 2018-12-07 ENCOUNTER — Other Ambulatory Visit: Payer: Self-pay | Admitting: Cardiovascular Disease

## 2019-01-04 ENCOUNTER — Other Ambulatory Visit: Payer: Self-pay | Admitting: Family Medicine

## 2019-01-04 DIAGNOSIS — R2689 Other abnormalities of gait and mobility: Secondary | ICD-10-CM

## 2019-01-04 DIAGNOSIS — R4182 Altered mental status, unspecified: Secondary | ICD-10-CM

## 2019-01-27 ENCOUNTER — Other Ambulatory Visit: Payer: Medicare Other

## 2019-02-13 ENCOUNTER — Other Ambulatory Visit: Payer: Medicare Other

## 2019-02-15 ENCOUNTER — Other Ambulatory Visit: Payer: Self-pay

## 2019-02-15 ENCOUNTER — Ambulatory Visit
Admission: RE | Admit: 2019-02-15 | Discharge: 2019-02-15 | Disposition: A | Discharge status: Home / Self Care | Payer: Medicare Other | Source: Ambulatory Visit | Attending: Family Medicine | Admitting: Family Medicine

## 2019-02-15 DIAGNOSIS — R4182 Altered mental status, unspecified: Secondary | ICD-10-CM

## 2019-02-15 DIAGNOSIS — R2689 Other abnormalities of gait and mobility: Secondary | ICD-10-CM

## 2019-03-13 ENCOUNTER — Ambulatory Visit: Payer: Medicare Other | Admitting: Internal Medicine

## 2019-05-10 ENCOUNTER — Ambulatory Visit: Payer: Medicare Other | Admitting: Internal Medicine

## 2019-05-10 ENCOUNTER — Encounter: Payer: Self-pay | Admitting: Internal Medicine

## 2019-05-10 ENCOUNTER — Other Ambulatory Visit: Payer: Self-pay

## 2019-05-10 DIAGNOSIS — J449 Chronic obstructive pulmonary disease, unspecified: Secondary | ICD-10-CM

## 2019-05-10 NOTE — Assessment & Plan Note (Addendum)
Quit smoking 2005 PFTs July 06, 2006   FEV1 of 58% predicted with a ratio of 56%  And 15% improvement after bronchodilator consistent with an asthmatic component. Allergy profile 06/10/2016 >  Eos 0.1/  IgE 7 RAST neg  - Sinus CT 06/15/2016 > neg    - PFT's  07/08/2016  FEV1 1.45 (79 % ) ratio 74  p 10 % improvement from saba p duoneb > 6 h  prior to study with DLCO  52 % corrects to 71  % for alv volume     All goals of chronic asthma control met including optimal function and elimination of symptoms with minimal need for rescue therapy.  Contingencies discussed in full including contacting this office immediately if not controlling the symptoms using the rule of two's.     Each maintenance medication was reviewed in detail including most importantly the difference between maintenance and as needed and under what circumstances the prns are to be used.  Please see AVS for specific  Instructions which are unique to this visit and I personally typed out  which were reviewed in detail in writing with the patient and a copy provided.

## 2019-05-10 NOTE — Patient Instructions (Signed)
No change medications   Please schedule a follow up visit in 12  months but call sooner if needed    

## 2019-05-10 NOTE — Progress Notes (Signed)
Subjective:     Patient ID: Sharon Mcdaniel, female   DOB: [birth year]      MRN: [id number]   Brief patient profile:   69 yowf quit smoking in 2005 with severe CAP dx as GOLD II copd Sept 2007 and referred to pulmonary clinic 06/10/2016 by Dr Kim/ Thedora Hinders with nl pfts 07/2016 p neb c/w AB not copd   History of Present Illness  06/10/2016 1st [facility] office visit/ Wert   Chief Complaint  Patient presents with  . Pulmonary Consult    Pt referred by Dr Epimenio Sarin for COPD/lung mass. Recent PET scan 05/11/16.   onset of new pattern in Dec 2016 with severe cough that comes and goes and a persistent sense of doe that persisteed since them not back to baseline esp Worse in  June 2017 but improved p abx Doe = MMRC1 = can walk nl pace, flat grade, can't hurry or go uphills or steps s sob Since onset of recurrent cough has had unresolved nasal congestion and intermittent purulent nasal drainage correlating with flares of "bronchitis' pred / abx def help cough but keeps coming back sev weeks later  rec  schedule sinus CT> neg 06/15/16  GERD  Diet     03/11/2018  f/u ov/Wert re:   AB on pulmocort/duoneb each am only Chief Complaint  Patient presents with  . Follow-up    Breathing is doing well and not coughing. She uses albuterol, atrovent and budesonide each once per day. No new co's.   Dyspnea:  Housework, steps ok/ dog walking some hills  Cough: no Sleep: well SABA use:  No extra rx rec No change rx   05/10/2019  f/u ov/Wert re:  AB pulm/duobeb once a twice dialy  Chief Complaint  Patient presents with  . Follow-up    Breathing is doing well and no new co's. She is using using her neb at least once per day.   Dyspnea:  yardwork ok, MMRC1 = can walk nl pace, flat grade, can't hurry or go uphills or steps s sob   Cough: none Sleeping: able to lie flat one pillow SABA use: as abov 02: none    No obvious day to day or daytime variability or assoc excess/ purulent sputum or  mucus plugs or hemoptysis or cp or chest tightness, subjective wheeze or overt sinus or hb symptoms.   Sleeping as above without nocturnal  or early am exacerbation  of respiratory  c/o's or need for noct saba. Also denies any obvious fluctuation of symptoms with weather or environmental changes or other aggravating or alleviating factors except as outlined above   No unusual exposure hx or h/o childhood pna/ asthma or knowledge of premature birth.  Current Allergies, Complete Past Medical History, Past Surgical History, Family History, and Social History were reviewed in [employer] record.  ROS  The following are not active complaints unless bolded Hoarseness, sore throat, dysphagia, dental problems, itching, sneezing,  nasal congestion or discharge of excess mucus or purulent secretions, ear ache,   fever, chills, sweats, unintended wt loss or wt gain, classically pleuritic or exertional cp,  orthopnea pnd or arm/hand swelling  or leg swelling, presyncope, palpitations, abdominal pain, anorexia, nausea, vomiting, diarrhea  or change in bowel habits or change in bladder habits, change in stools or change in urine, dysuria, hematuria,  rash, arthralgias, visual complaints, headache, numbness, weakness or ataxia or problems with walking or coordination,  change in mood or  memory.  Current Meds  Medication Sig  . albuterol (ACCUNEB) 1.25 MG/3ML nebulizer solution Take 3 mLs (1.25 mg total) by nebulization daily.  Marland Kitchen atorvastatin (LIPITOR) 10 MG tablet TAKE 1 TABLET BY MOUTH DAILY AT 6PM  . budesonide (PULMICORT) 0.25 MG/2ML nebulizer solution Take 2 mLs (0.25 mg total) by nebulization daily.  Marland Kitchen ipratropium (ATROVENT) 0.02 % nebulizer solution Take 2.5 mLs (0.5 mg total) by nebulization daily.  Marland Kitchen levothyroxine (SYNTHROID, LEVOTHROID) 112 MCG tablet Take 112 mcg by mouth daily.   Marland Kitchen triamcinolone cream (KENALOG) 0.5 % Apply 1 application topically 3 (three) times daily.                       Objective:   Physical Exam   Robust pleasant amb wf nad   05/10/2019          149  03/11/2018        139  03/10/2017          152   08/14/2016     157   07/08/16 161 lb 12.8 oz (73.4 kg)  06/10/16 164 lb (74.4 kg)  11/03/11 145 lb 8 oz (66 kg)     Vital signs reviewed - Note on arrival 02 sats  96% on RA        HEENT: full dentures/ nl turbinates bilaterally, and oropharynx. Nl external ear canals without cough reflex   NECK :  without JVD/Nodes/TM/ nl carotid upstrokes bilaterally   LUNGS: no acc muscle use,  Nl contour chest with very minimal insp/exp rhonchi  bilaterally without cough on insp or exp maneuvers   CV:  RRR  no s3 or murmur or increase in P2, and no edema   ABD:  soft and nontender with nl inspiratory excursion in the supine position. No bruits or organomegaly appreciated, bowel sounds nl  MS:  Nl gait/ ext warm without deformities, calf tenderness, cyanosis or clubbing No obvious joint restrictions   SKIN: warm and dry without lesions    NEURO:  alert, approp, nl sensorium with  no motor or cerebellar deficits apparent.                Assessment:

## 2019-05-11 ENCOUNTER — Other Ambulatory Visit: Payer: Self-pay | Admitting: Family Medicine

## 2019-05-11 ENCOUNTER — Encounter: Payer: Self-pay | Admitting: Internal Medicine

## 2019-05-11 DIAGNOSIS — S060X1A Concussion with loss of consciousness of 30 minutes or less, initial encounter: Secondary | ICD-10-CM

## 2019-05-12 ENCOUNTER — Ambulatory Visit
Admission: RE | Admit: 2019-05-12 | Discharge: 2019-05-12 | Disposition: A | Discharge status: Home / Self Care | Payer: Medicare Other | Source: Ambulatory Visit | Attending: Family Medicine | Admitting: Family Medicine

## 2019-05-12 DIAGNOSIS — S060X1A Concussion with loss of consciousness of 30 minutes or less, initial encounter: Secondary | ICD-10-CM

## 2019-08-03 ENCOUNTER — Other Ambulatory Visit: Payer: Self-pay | Admitting: Cardiovascular Disease

## 2019-08-03 DIAGNOSIS — I6529 Occlusion and stenosis of unspecified carotid artery: Secondary | ICD-10-CM

## 2019-08-07 ENCOUNTER — Other Ambulatory Visit: Payer: Self-pay

## 2019-08-07 ENCOUNTER — Ambulatory Visit (HOSPITAL_COMMUNITY)
Admission: RE | Admit: 2019-08-07 | Discharge: 2019-08-07 | Disposition: A | Discharge status: Home / Self Care | Payer: Medicare Other | Source: Ambulatory Visit | Attending: Cardiovascular Disease | Admitting: Cardiovascular Disease

## 2019-08-07 DIAGNOSIS — I6529 Occlusion and stenosis of unspecified carotid artery: Secondary | ICD-10-CM

## 2019-08-09 ENCOUNTER — Telehealth: Payer: Self-pay

## 2019-08-09 DIAGNOSIS — I779 Disorder of arteries and arterioles, unspecified: Secondary | ICD-10-CM

## 2019-08-09 NOTE — Telephone Encounter (Signed)
Called patient with results. Per Dr. Johnsie Cancel, plaque no stenosis f/u carotid duplex in 2 years. Patient verbalized understanding.

## 2019-09-15 ENCOUNTER — Other Ambulatory Visit: Payer: Self-pay | Admitting: Family Medicine

## 2019-09-15 DIAGNOSIS — Z1231 Encounter for screening mammogram for malignant neoplasm of breast: Secondary | ICD-10-CM

## 2019-10-16 NOTE — Progress Notes (Signed)
Cardiology Office Note   Date:  10/23/2019   ID:  Sharon Mcdaniel, DOB 08-21-1936, MRN [id number]  PCP:  Hayden Rasmussen, MD  Cardiologist:   Jenkins Rouge, MD   No chief complaint on file.     History of Present Illness: Sharon Mcdaniel is a 83 y.o. female f/u for carotid bruits. Had some facial paresthesias with negative TIA w/u Negative. Last carotid 08/07/19 plaque no stenosis TTE in 2017 normal just mild MR On statin for HLD Sees Dr Melvyn Novas for Gold 32 COPD Quit smoking in 2005 Significant arthritis in right hand   She is my wife's stepmother;s mom  Doing well no cardiac symptoms Active Granddaughter Sharon Mcdaniel moved to [facility] from [place] With twin boys Sharon Mcdaniel and Sharon Mcdaniel    Past Medical History:  Diagnosis Date  . COPD (chronic obstructive pulmonary disease) ([place])     Past Surgical History:  Procedure Laterality Date  . ABDOMINAL HYSTERECTOMY  1980  . BACK SURGERY  07/2011   synovial cyst removal  . TUBAL LIGATION  1970     Current Outpatient Medications  Medication Sig Dispense Refill  . albuterol (ACCUNEB) 1.25 MG/3ML nebulizer solution Take 3 mLs (1.25 mg total) by nebulization daily. 75 mL 3  . atorvastatin (LIPITOR) 10 MG tablet TAKE 1 TABLET BY MOUTH DAILY AT 6PM 90 tablet 3  . budesonide (PULMICORT) 0.25 MG/2ML nebulizer solution Take 2 mLs (0.25 mg total) by nebulization daily. 60 mL 3  . clonazePAM (KLONOPIN) 0.5 MG tablet Take 0.5 mg by mouth daily as needed.    . diphenhydrAMINE (BENADRYL) 50 MG tablet Take 50 mg by mouth as needed.    Marland Kitchen ipratropium (ATROVENT) 0.02 % nebulizer solution Take 2.5 mLs (0.5 mg total) by nebulization daily. 75 mL 3  . levothyroxine (SYNTHROID, LEVOTHROID) 112 MCG tablet Take 112 mcg by mouth daily.      No current facility-administered medications for this visit.    Allergies:   Fluconazole in dextrose, Gatifloxacin, Iodinated diagnostic agents, Ioxaglate, Quinolones, Tequin, Zithromax [azithromycin], and Codeine     Social History:  The patient  reports that she quit smoking about 15 years ago. She has a 30.00 pack-year smoking history. She has never used smokeless tobacco. She reports that she does not drink alcohol or use drugs.   Family History:  The patient's family history includes Asthma in her father; Heart disease in her father; Lung cancer in her mother; Rheum arthritis in her mother.    ROS:  Please see the history of present illness.   Otherwise, review of systems are positive for none.   All other systems are reviewed and negative.    PHYSICAL EXAM: VS:  BP 124/66   Pulse 71   Ht 5\' 2"  (1.575 m)   Wt 159 lb 12.8 oz (72.5 kg)   SpO2 98%   BMI 29.23 kg/m  , BMI Body mass index is 29.23 kg/m. Affect appropriate Healthy:  appears stated age 68: normal Neck supple with no adenopathy JVP normal left bruits no thyromegaly Lungs clear with no wheezing and good diaphragmatic motion Heart:  S1/S2 no murmur, no rub, gallop or click PMI normal Abdomen: benighn, BS positve, no tenderness, no AAA no bruit.  No HSM or HJR Distal pulses intact with no bruits Plus one bilateral edema worse on left with varicosities  Neuro non-focal Skin warm and dry Arthritis in hands right worse      EKG:   NSR mild LAE normal 10/06/17 NSR  rate 58 LAE normal 10/20/18 SR rate 59 LAE otherwise normal  10/23/19 SR rate 71 normal   Recent Labs: No results found for requested labs within last 8760 hours.    Lipid Panel    Component Value Date/Time   CHOL 144 10/05/2017 1534   TRIG 90 10/05/2017 1534   HDL 87 10/05/2017 1534   CHOLHDL 1.7 10/05/2017 1534   LDLCALC 39 10/05/2017 1534      Wt Readings from Last 3 Encounters:  10/23/19 159 lb 12.8 oz (72.5 kg)  05/10/19 149 lb (67.6 kg)  10/20/18 146 lb 12.8 oz (66.6 kg)      Other studies Reviewed: Additional studies/ records that were reviewed today include: Notes Dr Einar Gip Echo and duplex .Carotid duplex 08/07/19     ASSESSMENT AND  PLAN:  1.  Facial Paresthesia etiology not clear but low likelyhood of TIA doubt PAF resolved 2.  Left Carotid Bruit:  Duplex 08/07/19 plaque no stenosis  3. Thyroid  Euthyroid continue current replacement dose TSH with primary  4. GERD low carb diet pepcid  5. Varicose Veins:  Left [place] no DVT 09/30/16 f/u [facility]  6. COPD:  F/u Wert CT October 2017 improved   7. HLD:  Continue statin labs with primary LDL 39 10/05/17    Current medicines are reviewed at length with the patient today.  The patient does not have concerns regarding medicines.  The following changes have been made:  no change  Labs/ tests ordered today include: None   Orders Placed This Encounter  Procedures  . EKG 12-Lead     Disposition:   FU with me in a year      Signed, Jenkins Rouge, MD  10/23/2019 4:18 PM    [facility] [address], [place], [place]  [address] Phone: [phone]; Fax: [phone]

## 2019-10-23 ENCOUNTER — Other Ambulatory Visit: Payer: Self-pay

## 2019-10-23 ENCOUNTER — Encounter: Payer: Self-pay | Admitting: Cardiovascular Disease

## 2019-10-23 ENCOUNTER — Ambulatory Visit: Payer: Medicare Other | Admitting: Cardiovascular Disease

## 2019-10-23 VITALS — BP 124/66 | HR 71 | Ht 62.0 in | Wt 159.8 lb

## 2019-10-23 DIAGNOSIS — E785 Hyperlipidemia, unspecified: Secondary | ICD-10-CM | POA: Diagnosis not present

## 2019-10-23 NOTE — Patient Instructions (Addendum)

## 2019-11-09 ENCOUNTER — Ambulatory Visit: Payer: Medicare Other

## 2019-11-15 ENCOUNTER — Other Ambulatory Visit: Payer: Self-pay | Admitting: Internal Medicine

## 2019-12-19 ENCOUNTER — Other Ambulatory Visit: Payer: Self-pay | Admitting: Cardiovascular Disease

## 2020-04-22 ENCOUNTER — Other Ambulatory Visit: Payer: Self-pay | Admitting: Internal Medicine

## 2020-04-27 ENCOUNTER — Other Ambulatory Visit: Payer: Self-pay

## 2020-04-27 ENCOUNTER — Encounter: Payer: Self-pay | Admitting: Physician Assistant

## 2020-04-27 ENCOUNTER — Inpatient Hospital Stay (HOSPITAL_COMMUNITY)
Admission: RE | Admit: 2020-04-27 | Discharge: 2020-05-01 | DRG: 254 | Disposition: A | Discharge status: Home / Self Care | Payer: Medicare Other | Attending: Vascular Surgery | Admitting: Vascular Surgery

## 2020-04-27 ENCOUNTER — Emergency Department (HOSPITAL_COMMUNITY): Payer: Medicare Other

## 2020-04-27 ENCOUNTER — Encounter (HOSPITAL_COMMUNITY): Payer: Self-pay | Admitting: Emergency Medicine

## 2020-04-27 ENCOUNTER — Ambulatory Visit
Admission: EM | Admit: 2020-04-27 | Discharge: 2020-04-27 | Disposition: A | Discharge status: Home / Self Care | Payer: Medicare Other | Source: Home / Self Care

## 2020-04-27 DIAGNOSIS — I739 Peripheral vascular disease, unspecified: Secondary | ICD-10-CM

## 2020-04-27 DIAGNOSIS — Z9889 Other specified postprocedural states: Secondary | ICD-10-CM | POA: Diagnosis not present

## 2020-04-27 DIAGNOSIS — Z7951 Long term (current) use of inhaled steroids: Secondary | ICD-10-CM

## 2020-04-27 DIAGNOSIS — E039 Hypothyroidism, unspecified: Secondary | ICD-10-CM | POA: Diagnosis present

## 2020-04-27 DIAGNOSIS — I998 Other disorder of circulatory system: Secondary | ICD-10-CM

## 2020-04-27 DIAGNOSIS — Z79899 Other long term (current) drug therapy: Secondary | ICD-10-CM | POA: Diagnosis not present

## 2020-04-27 DIAGNOSIS — Z79890 Hormone replacement therapy (postmenopausal): Secondary | ICD-10-CM

## 2020-04-27 DIAGNOSIS — Z91041 Radiographic dye allergy status: Secondary | ICD-10-CM | POA: Diagnosis not present

## 2020-04-27 DIAGNOSIS — Z90710 Acquired absence of both cervix and uterus: Secondary | ICD-10-CM

## 2020-04-27 DIAGNOSIS — Z87891 Personal history of nicotine dependence: Secondary | ICD-10-CM

## 2020-04-27 DIAGNOSIS — Z20822 Contact with and (suspected) exposure to covid-19: Secondary | ICD-10-CM | POA: Diagnosis present

## 2020-04-27 DIAGNOSIS — Z7982 Long term (current) use of aspirin: Secondary | ICD-10-CM | POA: Diagnosis not present

## 2020-04-27 DIAGNOSIS — Z86718 Personal history of other venous thrombosis and embolism: Secondary | ICD-10-CM

## 2020-04-27 DIAGNOSIS — I70221 Atherosclerosis of native arteries of extremities with rest pain, right leg: Principal | ICD-10-CM | POA: Diagnosis present

## 2020-04-27 DIAGNOSIS — E785 Hyperlipidemia, unspecified: Secondary | ICD-10-CM | POA: Diagnosis present

## 2020-04-27 DIAGNOSIS — R0989 Other specified symptoms and signs involving the circulatory and respiratory systems: Secondary | ICD-10-CM

## 2020-04-27 DIAGNOSIS — J449 Chronic obstructive pulmonary disease, unspecified: Secondary | ICD-10-CM | POA: Diagnosis present

## 2020-04-27 DIAGNOSIS — M79609 Pain in unspecified limb: Secondary | ICD-10-CM

## 2020-04-27 DIAGNOSIS — Z419 Encounter for procedure for purposes other than remedying health state, unspecified: Secondary | ICD-10-CM

## 2020-04-27 LAB — BASIC METABOLIC PANEL
Anion gap: 11 (ref 5–15)
BUN: 15 mg/dL (ref 8–23)
CO2: 23 mmol/L (ref 22–32)
Calcium: 8.7 mg/dL — ABNORMAL LOW (ref 8.9–10.3)
Chloride: 100 mmol/L (ref 98–111)
Creatinine, Ser: 0.84 mg/dL (ref 0.44–1.00)
GFR calc Af Amer: >60 mL/min (ref >60)
GFR calc non Af Amer: >60 mL/min (ref >60)
Glucose, Bld: 107 mg/dL — ABNORMAL HIGH (ref 70–99)
Potassium: 4.1 mmol/L (ref 3.5–5.1)
Sodium: 134 mmol/L — ABNORMAL LOW (ref 135–145)

## 2020-04-27 LAB — SARS CORONAVIRUS 2 BY RT PCR (HOSPITAL ORDER, PERFORMED IN ~~LOC~~ HOSPITAL LAB): SARS Coronavirus 2: NEGATIVE

## 2020-04-27 LAB — CBC
HCT: 41.2 % (ref 36.0–46.0)
HCT: 40.3 % (ref 36.0–46.0)
Hemoglobin: 13.3 g/dL (ref 12.0–15.0)
Hemoglobin: 12.8 g/dL (ref 12.0–15.0)
MCH: 28.3 pg (ref 26.0–34.0)
MCH: 27.8 pg (ref 26.0–34.0)
MCHC: 32.3 g/dL (ref 30.0–36.0)
MCHC: 31.8 g/dL (ref 30.0–36.0)
MCV: 87.7 fL (ref 80.0–100.0)
MCV: 87.4 fL (ref 80.0–100.0)
Platelets: 173 10*3/uL (ref 150–400)
Platelets: 157 10*3/uL (ref 150–400)
RBC: 4.7 MIL/uL (ref 3.87–5.11)
RBC: 4.61 MIL/uL (ref 3.87–5.11)
RDW: 14.3 % (ref 11.5–15.5)
RDW: 14.4 % (ref 11.5–15.5)
WBC: 7.3 10*3/uL (ref 4.0–10.5)
WBC: 6.8 10*3/uL (ref 4.0–10.5)
nRBC: 0 % (ref 0.0–0.2)
nRBC: 0 % (ref 0.0–0.2)

## 2020-04-27 LAB — PROTIME-INR
INR: 1 (ref 0.8–1.2)
Prothrombin Time: 12.8 seconds (ref 11.4–15.2)

## 2020-04-27 LAB — URINALYSIS, ROUTINE W REFLEX MICROSCOPIC
Bacteria, UA: NONE SEEN
Bilirubin Urine: NEGATIVE
Glucose, UA: NEGATIVE mg/dL
Hgb urine dipstick: NEGATIVE
Ketones, ur: NEGATIVE mg/dL
Nitrite: NEGATIVE
Protein, ur: NEGATIVE mg/dL
Specific Gravity, Urine: 1.011 (ref 1.005–1.030)
pH: 6 (ref 5.0–8.0)

## 2020-04-27 MED ORDER — IPRATROPIUM BROMIDE 0.02 % IN SOLN: 2.5000 mL | Freq: Every day | RESPIRATORY_TRACT | Status: DC

## 2020-04-27 MED ORDER — METOPROLOL TARTRATE 5 MG/5ML IV SOLN: 2.0000 mg | INTRAVENOUS | Status: DC | PRN

## 2020-04-27 MED ORDER — MIRTAZAPINE 15 MG PO TABS
15.0000 mg | ORAL_TABLET | Freq: Every day | ORAL | Status: DC
  Administered 2020-04-27 – 2020-04-30 (×4): 15 mg via ORAL
  Filled 2020-04-27 (×5): qty 1

## 2020-04-27 MED ORDER — ALBUTEROL SULFATE (2.5 MG/3ML) 0.083% IN NEBU: 2.5000 mg | INHALATION_SOLUTION | Freq: Every day | RESPIRATORY_TRACT | Status: DC

## 2020-04-27 MED ORDER — BUDESONIDE 0.25 MG/2ML IN SUSP
2.0000 mL | Freq: Every day | RESPIRATORY_TRACT | Status: DC
  Administered 2020-04-28 – 2020-05-01 (×4): 0.25 mg via RESPIRATORY_TRACT
  Filled 2020-04-27 (×4): qty 2

## 2020-04-27 MED ORDER — IBUPROFEN 400 MG PO TABS: 400.0000 mg | ORAL_TABLET | Freq: Four times a day (QID) | ORAL | Status: DC | PRN

## 2020-04-27 MED ORDER — ALUM & MAG HYDROXIDE-SIMETH 200-200-20 MG/5ML PO SUSP: 15.0000 mL | ORAL | Status: DC | PRN

## 2020-04-27 MED ORDER — DIPHENHYDRAMINE HCL 25 MG PO CAPS
50.0000 mg | ORAL_CAPSULE | ORAL | Status: DC | PRN
  Filled 2020-04-27: qty 2

## 2020-04-27 MED ORDER — DIPHENHYDRAMINE HCL 50 MG/ML IJ SOLN: 50.0000 mg | Freq: Once | INTRAMUSCULAR | Status: AC

## 2020-04-27 MED ORDER — PREDNISONE 20 MG PO TABS
50.0000 mg | ORAL_TABLET | Freq: Four times a day (QID) | ORAL | Status: AC
  Administered 2020-04-27 – 2020-04-28 (×3): 50 mg via ORAL
  Filled 2020-04-27 (×2): qty 2
  Filled 2020-04-27: qty 3

## 2020-04-27 MED ORDER — PANTOPRAZOLE SODIUM 40 MG PO TBEC
40.0000 mg | DELAYED_RELEASE_TABLET | Freq: Every day | ORAL | Status: DC
  Administered 2020-04-27 – 2020-05-01 (×5): 40 mg via ORAL
  Filled 2020-04-27 (×5): qty 1

## 2020-04-27 MED ORDER — OXYCODONE-ACETAMINOPHEN 5-325 MG PO TABS
1.0000 | ORAL_TABLET | ORAL | Status: DC | PRN
  Administered 2020-04-28: 1 via ORAL
  Filled 2020-04-27: qty 1

## 2020-04-27 MED ORDER — ASPIRIN 325 MG PO TABS: 650.0000 mg | ORAL_TABLET | Freq: Once | ORAL | Status: DC

## 2020-04-27 MED ORDER — HYDRALAZINE HCL 20 MG/ML IJ SOLN: 5.0000 mg | INTRAMUSCULAR | Status: DC | PRN

## 2020-04-27 MED ORDER — GUAIFENESIN-DM 100-10 MG/5ML PO SYRP: 15.0000 mL | ORAL_SOLUTION | ORAL | Status: DC | PRN

## 2020-04-27 MED ORDER — KCL IN DEXTROSE-NACL 20-5-0.45 MEQ/L-%-% IV SOLN
INTRAVENOUS | Status: DC
  Filled 2020-04-27: qty 1000

## 2020-04-27 MED ORDER — DIPHENHYDRAMINE HCL 25 MG PO CAPS
50.0000 mg | ORAL_CAPSULE | Freq: Once | ORAL | Status: AC
  Administered 2020-04-28: 50 mg via ORAL
  Filled 2020-04-27: qty 2

## 2020-04-27 MED ORDER — HEPARIN (PORCINE) 25000 UT/250ML-% IV SOLN
950.0000 [IU]/h | INTRAVENOUS | Status: DC
  Administered 2020-04-27: 950 [IU]/h via INTRAVENOUS
  Filled 2020-04-27: qty 250

## 2020-04-27 MED ORDER — ACETAMINOPHEN 325 MG PO TABS
325.0000 mg | ORAL_TABLET | ORAL | Status: DC | PRN
  Administered 2020-04-27: 650 mg via ORAL
  Filled 2020-04-27: qty 2

## 2020-04-27 MED ORDER — HEPARIN BOLUS VIA INFUSION
3500.0000 [IU] | Freq: Once | INTRAVENOUS | Status: AC
  Administered 2020-04-27: 3500 [IU] via INTRAVENOUS
  Filled 2020-04-27: qty 3500

## 2020-04-27 MED ORDER — LABETALOL HCL 5 MG/ML IV SOLN
10.0000 mg | INTRAVENOUS | Status: DC | PRN
  Administered 2020-04-28: 10 mg via INTRAVENOUS
  Filled 2020-04-27: qty 4

## 2020-04-27 MED ORDER — ONDANSETRON HCL 4 MG/2ML IJ SOLN: 4.0000 mg | Freq: Four times a day (QID) | INTRAMUSCULAR | Status: DC | PRN

## 2020-04-27 MED ORDER — POTASSIUM CHLORIDE CRYS ER 20 MEQ PO TBCR: 20.0000 meq | EXTENDED_RELEASE_TABLET | Freq: Once | ORAL | Status: DC

## 2020-04-27 MED ORDER — LEVOTHYROXINE SODIUM 112 MCG PO TABS
112.0000 ug | ORAL_TABLET | Freq: Every day | ORAL | Status: DC
  Administered 2020-04-28 – 2020-05-01 (×4): 112 ug via ORAL
  Filled 2020-04-27 (×4): qty 1

## 2020-04-27 MED ORDER — ACETAMINOPHEN 325 MG RE SUPP
325.0000 mg | RECTAL | Status: DC | PRN
  Filled 2020-04-27: qty 2

## 2020-04-27 MED ORDER — ATORVASTATIN CALCIUM 10 MG PO TABS
10.0000 mg | ORAL_TABLET | Freq: Every day | ORAL | Status: DC
  Administered 2020-04-27 – 2020-04-28 (×2): 10 mg via ORAL
  Filled 2020-04-27 (×2): qty 1

## 2020-04-27 MED ORDER — PHENOL 1.4 % MT LIQD
1.0000 | OROMUCOSAL | Status: DC | PRN
  Filled 2020-04-27: qty 177

## 2020-04-27 MED ORDER — ALBUTEROL SULFATE 1.25 MG/3ML IN NEBU: 3.0000 mL | INHALATION_SOLUTION | Freq: Every day | RESPIRATORY_TRACT | Status: DC

## 2020-04-27 NOTE — ED Triage Notes (Signed)
Reports R foot pain, tingling, and feeling ice cold since yesterday.  Seen at [facility] this morning.  Went home and was called and told to come to ED for resting claudication, R foot pallor, and difficulty palpating pulses.  Pt to see Vascular Surgeon.  Denies pain at present.

## 2020-04-27 NOTE — H&P (Signed)
REASON FOR ADMISSION:    Right leg pain.  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  She has a markedly diminished ABI of 30% with dampened monophasic signals in the right foot.  Although she denies any history of claudication, her duplex scan would suggest that she had underlying superficial femoral artery occlusive disease and that this likely clotted.  She has no reason to have embolized to her right lower extremity.  The differential diagnosis would also include an abdominal aortic aneurysm or popliteal artery aneurysm.  Her Covid test is pending. If she were Covid positive then that would also be in the differential diagnosis given the hypercoagulable conditions associated with this.  Given that she is having fairly significant symptoms I will admit the patient for intravenous heparin and obtain a CT angiogram with runoff.  She has a dye allergy and will have to be premedicated for this.  I will make further recommendations pending these results.   [name], MD Office: [phone]   HPI:   Sharon Mcdaniel is a pleasant 84 y.o. female, who presents to the emergency department with a 2-day history of right leg pain.  Prior to developing right leg pain 2 days ago the patient states that she was fairly active and denies any history of claudication.  She denies hip, thigh, or calf claudication.  She denies any history of rest pain.  2 days ago she noticed some cramping in her right foot and leg and then began having more severe pain in the right leg yesterday and presents to the emergency department today.  The patient's only risk factor for peripheral vascular disease is a remote history of tobacco use.  She smoked 1 pack/day of cigarettes for many years but quit in 2005.  She denies any history of diabetes, hypertension, hypercholesterolemia, or family history  of premature cardiovascular disease.  She has no significant cardiac history and denies any history of arrhythmias, atrial fibrillation, myocardial infarction, or congestive heart failure.  She is unaware of any history of aneurysmal disease.  Past Medical History:  Diagnosis Date  . COPD (chronic obstructive pulmonary disease) ([facility])     Family History  Problem Relation Age of Onset  . Lung cancer Mother   . Rheum arthritis Mother   . Asthma Father   . Heart disease Father     SOCIAL HISTORY: Social History   Socioeconomic History  . Marital status: Widowed    Spouse name: Not on file  . Number of children: Not on file  . Years of education: Not on file  . Highest education level: Not on file  Occupational History  . Occupation: retired  Tobacco Use  . Smoking status: Former Smoker    Packs/day: 1.00    Years: 30.00    Pack years: 30.00    Quit date: 11/03/2003    Years since quitting: 16.4  . Smokeless tobacco: Never Used  Vaping Use  . Vaping Use: Never used  Substance and Sexual Activity  . Alcohol use: No  . Drug use: No  . Sexual activity: Never  Other Topics Concern  . Not on file  Social History Narrative  . Not on file   Social Determinants of Health   Financial Resource Strain:   . Difficulty of Paying Living Expenses:   Food Insecurity:   . Worried About Estate manager/land agent  of Food in the Last Year:   . [place] in the Last Year:   Transportation Needs:   . Lack of Transportation (Medical):   Marland Kitchen Lack of Transportation (Non-Medical):   Physical Activity:   . Days of Exercise per Week:   . Minutes of Exercise per Session:   Stress:   . Feeling of Stress :   Social Connections:   . Frequency of Communication with Friends and Family:   . Frequency of Social Gatherings with Friends and Family:   . Attends Religious Services:   . Active Member of Clubs or Organizations:   . Attends Archivist Meetings:   Marland Kitchen Marital Status:   Intimate  Partner Violence:   . Fear of Current or Ex-Partner:   . Emotionally Abused:   Marland Kitchen Physically Abused:   . Sexually Abused:     Allergies  Allergen Reactions  . Fluconazole In Dextrose Hives and Shortness Of Breath  . Gatifloxacin Hives and Shortness Of Breath  . Iodinated Diagnostic Agents Hives and Shortness Of Breath  . Ioxaglate Hives and Shortness Of Breath  . Quinolones Hives and Shortness Of Breath  . Tequin Hives and Shortness Of Breath  . Zithromax [Azithromycin]     GI Upset  . Codeine Other (See Comments)    Makes her jittery     No current facility-administered medications for this encounter.   Current Outpatient Medications  Medication Sig Dispense Refill  . albuterol (ACCUNEB) 1.25 MG/3ML nebulizer solution TAKE 3 MLS VIA NEBULIZER DAILY (Patient taking differently: Take 3 mLs by nebulization daily. ) 90 mL 5  . aspirin 325 MG tablet Take 650 mg by mouth once.    Marland Kitchen atorvastatin (LIPITOR) 10 MG tablet TAKE 1 TABLET BY MOUTH DAILY AT 6PM (Patient taking differently: Take by mouth daily. ) 90 tablet 2  . budesonide (PULMICORT) 0.25 MG/2ML nebulizer solution TAKE 2 MLS VIA NEBULIZER DAILY (Patient taking differently: Take 2 mLs by nebulization daily. ) 60 mL 0  . clonazePAM (KLONOPIN) 0.5 MG tablet Take 0.5 mg by mouth daily as needed for anxiety.     . diphenhydrAMINE (BENADRYL) 50 MG tablet Take 50 mg by mouth as needed for allergies (bee stings).     Marland Kitchen ibuprofen (ADVIL) 200 MG tablet Take 400 mg by mouth every 6 (six) hours as needed for moderate pain.    Marland Kitchen ipratropium (ATROVENT) 0.02 % nebulizer solution TAKE 2.5 MLS VIA NEBULIZER DAILY (Patient taking differently: Take 2.5 mLs by nebulization daily. ) 150 mL 5  . levothyroxine (SYNTHROID, LEVOTHROID) 112 MCG tablet Take 112 mcg by mouth daily.     . mirtazapine (REMERON) 15 MG tablet Take 15 mg by mouth at bedtime.      REVIEW OF SYSTEMS:  [X]  denotes positive finding, [ ]  denotes negative finding Cardiac   Comments:  Chest pain or chest pressure:    Shortness of breath upon exertion:    Short of breath when lying flat:    Irregular heart rhythm:        Vascular    Pain in calf, thigh, or hip brought on by ambulation: x   Pain in feet at night that wakes you up from your sleep:  x   Blood clot in your veins:    Leg swelling:         Pulmonary    Oxygen at home:    Productive cough:     Wheezing:  Neurologic    Sudden weakness in arms or legs:     Sudden numbness in arms or legs:     Sudden onset of difficulty speaking or slurred speech:    Temporary loss of vision in one eye:     Problems with dizziness:         Gastrointestinal    Blood in stool:     Vomited blood:         Genitourinary    Burning when urinating:     Blood in urine:        Psychiatric    Major depression:         Hematologic    Bleeding problems:    Problems with blood clotting too easily:        Skin    Rashes or ulcers:        Constitutional    Fever or chills:     PHYSICAL EXAM:   Vitals:   04/27/20 1331 04/27/20 1332 04/27/20 1350 04/27/20 1500  BP: (!) 136/103  (!) 143/66 (!) 152/97  Pulse:  69 66 67  Resp:  16 (!) 30 19  Temp:      TempSrc:      SpO2:  99% 98% 97%    GENERAL: The patient is a well-nourished female, in no acute distress. The vital signs are documented above. CARDIAC: There is a regular rate and rhythm.  VASCULAR: I do not detect carotid bruits. On the right side, which is the symptomatic side, she has a palpable femoral pulse.  I cannot palpate popliteal or pedal pulses.  I can obtain a peroneal signal with the Doppler.  The right foot has adequate temperature and color. On the left side she has a palpable femoral and popliteal pulse.  She has a brisk posterior tibial and dorsalis pedis signal with a Doppler on the left. PULMONARY: There is good air exchange bilaterally without wheezing or rales. ABDOMEN: Soft and non-tender with normal pitched bowel sounds.  I  do not palpate an abdominal aortic aneurysm. MUSCULOSKELETAL: There are no major deformities or cyanosis. NEUROLOGIC: Motor and sensory function is intact both lower extremities. SKIN: There are no ulcers or rashes noted. PSYCHIATRIC: The patient has a normal affect.  DATA:    ARTERIAL DOPPLER STUDY: I have independently interpreted her arterial Doppler study today.  On the right side she has a dampened monophasic posterior tibial and dorsalis pedis signal with the Doppler.  ABI is 32%.  Toe pressure is 0.  On the left side she has a monophasic dorsalis pedis and posterior tibial signal.  ABI is 97%.  Toe pressure is 72 mmHg.  ARTERIAL DUPLEX: I have independently interpreted her arterial duplex scan today.  On the right side there is a total occlusion of the superficial femoral artery with reconstitution of the popliteal artery.  The anterior tibial artery appears to be occluded.  The posterior tibial artery appears to occlude at the ankle.  LABS: Potassium is 4.1.  Creatinine 0.84.  GFR is greater than 60  Covid is pending

## 2020-04-27 NOTE — ED Triage Notes (Signed)
Pt presents with right foot pain that started yesterday.

## 2020-04-27 NOTE — ED Provider Notes (Signed)
[facility]    CSN: [id number] Arrival date & time: 04/27/20  0931      History   Chief Complaint Chief Complaint  Patient presents with  . Foot Pain    HPI Sharon Mcdaniel is a 84 y.o. female.   84 year old female with history of hypothyroidism, COPD, HLD comes in with daughter for right foot/ankle pain. Current symptom started 2 days ago, but had similar symptoms a few days ago prior to this. No pain at rest, pain with ambulation. Pain is to the right ankle that then radiates to the great toe. Denies injury/trauma. Denies swelling, erythema, warmth. Tingling to the foot at times, cold feet. This morning, when pain was significant, had what sounds like pallor. No recent surgeries. No leg swelling, calf pain. History of DVT.     Past Medical History:  Diagnosis Date  . COPD (chronic obstructive pulmonary disease) [facility])     Patient Active Problem List   Diagnosis Date Noted  . Abnormal CT of the chest 06/11/2016  . Dyspnea 06/10/2016  . Cough 06/10/2016  . HYPOTHYROIDISM 09/05/2007  . ASTHMATIC BRONCHITIS, ACUTE 09/05/2007  . EMPHYSEMA 09/05/2007  . Asthmatic bronchitis , chronic ([place]) 09/05/2007  . TEMPOROMANDIBULAR JOINT PAIN 09/05/2007    Past Surgical History:  Procedure Laterality Date  . ABDOMINAL HYSTERECTOMY  1980  . BACK SURGERY  07/2011   synovial cyst removal  . TUBAL LIGATION  1970    OB History   No obstetric history on file.      Home Medications    Prior to Admission medications   Medication Sig Start Date End Date Taking? Authorizing Provider  albuterol (ACCUNEB) 1.25 MG/3ML nebulizer solution TAKE 3 MLS VIA NEBULIZER DAILY 11/15/19   Tanda Rockers, MD  atorvastatin (LIPITOR) 10 MG tablet TAKE 1 TABLET BY MOUTH DAILY AT 6PM 12/19/19   Josue Hector, MD  budesonide (PULMICORT) 0.25 MG/2ML nebulizer solution TAKE 2 MLS VIA NEBULIZER DAILY 04/22/20   Tanda Rockers, MD  clonazePAM (KLONOPIN) 0.5 MG tablet Take 0.5 mg by mouth daily  as needed. 09/29/19   [provider]  diphenhydrAMINE (BENADRYL) 50 MG tablet Take 50 mg by mouth as needed. 12/16/15   [provider]  ipratropium (ATROVENT) 0.02 % nebulizer solution TAKE 2.5 MLS VIA NEBULIZER DAILY 11/15/19   Tanda Rockers, MD  levothyroxine (SYNTHROID, LEVOTHROID) 112 MCG tablet Take 112 mcg by mouth daily.     [provider]    Family History Family History  Problem Relation Age of Onset  . Lung cancer Mother   . Rheum arthritis Mother   . Asthma Father   . Heart disease Father     Social History Social History   Tobacco Use  . Smoking status: Former Smoker    Packs/day: 1.00    Years: 30.00    Pack years: 30.00    Quit date: 11/03/2003    Years since quitting: 16.4  . Smokeless tobacco: Never Used  Vaping Use  . Vaping Use: Never used  Substance Use Topics  . Alcohol use: No  . Drug use: No     Allergies   Fluconazole in dextrose, Gatifloxacin, Iodinated diagnostic agents, Ioxaglate, Quinolones, Tequin, Zithromax [azithromycin], and Codeine   Review of Systems Review of Systems  Reason unable to perform ROS: See HPI as above.     Physical Exam Triage Vital Signs ED Triage Vitals [04/27/20 0936]  Enc Vitals Group     BP Marland Kitchen)  165/81     Pulse Rate 78     Resp 20     Temp 97.7 F (36.5 C)     Temp Source Oral     SpO2 94 %     Weight      Height      Head Circumference      Peak Flow      Pain Score      Pain Loc      Pain Edu?      Excl. in [place]?    No data found.  Updated Vital Signs BP (!) 165/81 (BP Location: Left Arm)   Pulse 78   Temp 97.7 F (36.5 C) (Oral)   Resp 20   SpO2 94%   Physical Exam Constitutional:      General: She is not in acute distress.    Appearance: Normal appearance. She is well-developed. She is not toxic-appearing or diaphoretic.  HENT:     Head: Normocephalic and atraumatic.  Eyes:     Conjunctiva/sclera: Conjunctivae normal.     Pupils: Pupils are equal, round,  and reactive to light.  Pulmonary:     Effort: Pulmonary effort is normal. No respiratory distress.     Comments: Speaking in full sentences without difficulty Musculoskeletal:     Cervical back: Normal range of motion and neck supple.     Comments: No obvious swelling, contusion. Slightly dusky skin toe to the right foot. Right foot is cold to touch compared to left. No obvious cyanosis. No tenderness to palpation of the foot. Full ROM of BLE. Strength 5/5. Pedal pulse 1+. Pallor with leg elevation. Cap refill 2-3s  Skin:    General: Skin is warm and dry.  Neurological:     Mental Status: She is alert and oriented to person, place, and time.    UC Treatments / Results  Labs (all labs ordered are listed, but only abnormal results are displayed) Labs Reviewed - No data to display  EKG   Radiology No results found.  Procedures Procedures (including critical care time)  Medications Ordered in UC Medications - No data to display  Initial Impression / Assessment and Plan / UC Course  I have reviewed the triage vital signs and the nursing notes.  Pertinent labs & imaging results that were available during my care of the patient were reviewed by me and considered in my medical decision making (see chart for details).    Discussed case with Dr Lanny Cramp. Patient with resting claudication, right foot pallor with elevation, cold to touch, hard to find pedal pulse, but with 1+. No cyanosis. Given resting pain, to go to the ED for further evaluation. Patient and daughter expresses understanding and agrees to plan.  Final Clinical Impressions(s) / UC Diagnoses   Final diagnoses:  Claudication [facility])   Discharge Instructions   None    ED Prescriptions    None     PDMP not reviewed this encounter.   Ok Edwards, PA-C 04/27/20 1142

## 2020-04-27 NOTE — ED Notes (Signed)
Attempted report x 1. 4E still in report, requesting call back in 20 minutes.

## 2020-04-27 NOTE — Progress Notes (Signed)
ANTICOAGULATION CONSULT NOTE - Initial Consult  Pharmacy Consult for heparin Indication: Ischemic RLE   Allergies  Allergen Reactions  . Fluconazole In Dextrose Hives and Shortness Of Breath  . Gatifloxacin Hives and Shortness Of Breath  . Iodinated Diagnostic Agents Hives and Shortness Of Breath  . Ioxaglate Hives and Shortness Of Breath  . Quinolones Hives and Shortness Of Breath  . Tequin Hives and Shortness Of Breath  . Zithromax [Azithromycin]     GI Upset  . Codeine Other (See Comments)    Makes her jittery     Patient Measurements:   Heparin Dosing Weight: 62.1 kg   Vital Signs: Temp: 98.9 F (37.2 C) (06/26 1236) Temp Source: Oral (06/26 1236) BP: 152/97 (06/26 1500) Pulse Rate: 67 (06/26 1500)  Labs: Recent Labs    04/27/20 1357  HGB 13.3  HCT 41.2  PLT 173  CREATININE 0.84    CrCl cannot be calculated (Unknown ideal weight.).   Medical History: Past Medical History:  Diagnosis Date  . COPD (chronic obstructive pulmonary disease) (HCC)     Medications:  (Not in a hospital admission)   Assessment: 64 YOF who presents with a 2-day onset of pain in right leg concerning for ischemia. Pharmacy consulted to start IV heparin in preparation for CT angio with runoff. H/H and Plt wnl. SCr wnl.   Goal of Therapy:  Heparin level 0.3-0.7 units/ml Monitor platelets by anticoagulation protocol: Yes   Plan:  -Heparin 3500 units IV bolus followed by heparin infusion at 950 units/hr  -F/u 8 hr HL -Monitor daily HL, CBC and s/s of bleeding  Albertina Parr, PharmD., BCPS, BCCCP Clinical Pharmacist Clinical phone for 04/27/20 until 11:30pm: [phone] If after 11:30pm, please refer to [facility] for unit-specific pharmacist

## 2020-04-27 NOTE — ED Provider Notes (Signed)
[facility] EMERGENCY DEPARTMENT Provider Note   CSN: [id number] Arrival date & time: 04/27/20  1230     History Chief Complaint  Patient presents with  . Foot Pain    Sharon Mcdaniel is a 84 y.o. female.  HPI Patient presents with right foot pain and tingling.  Started last night.  Although may have had some mild pain in the foot for a few days before this.  Some pain with rest but mostly with walking.  Seen in urgent care and sent here to see vascular surgery.  Foot is reportedly cold and white.  Difficulty getting pulses.  Has not had a history of claudication.  Took 2 full dose aspirin this morning.  Does however have an IV contrast allergy.  No back pain.  No chest pain.  No fevers.    Past Medical History:  Diagnosis Date  . COPD (chronic obstructive pulmonary disease) [facility])     Patient Active Problem List   Diagnosis Date Noted  . Abnormal CT of the chest 06/11/2016  . Dyspnea 06/10/2016  . Cough 06/10/2016  . HYPOTHYROIDISM 09/05/2007  . ASTHMATIC BRONCHITIS, ACUTE 09/05/2007  . EMPHYSEMA 09/05/2007  . Asthmatic bronchitis , chronic ([place]) 09/05/2007  . TEMPOROMANDIBULAR JOINT PAIN 09/05/2007    Past Surgical History:  Procedure Laterality Date  . ABDOMINAL HYSTERECTOMY  1980  . BACK SURGERY  07/2011   synovial cyst removal  . TUBAL LIGATION  1970     OB History   No obstetric history on file.     Family History  Problem Relation Age of Onset  . Lung cancer Mother   . Rheum arthritis Mother   . Asthma Father   . Heart disease Father     Social History   Tobacco Use  . Smoking status: Former Smoker    Packs/day: 1.00    Years: 30.00    Pack years: 30.00    Quit date: 11/03/2003    Years since quitting: 16.4  . Smokeless tobacco: Never Used  Vaping Use  . Vaping Use: Never used  Substance Use Topics  . Alcohol use: No  . Drug use: No    Home Medications Prior to Admission medications   Medication Sig Start Date End Date  Taking? Authorizing Provider  albuterol (ACCUNEB) 1.25 MG/3ML nebulizer solution TAKE 3 MLS VIA NEBULIZER DAILY Patient taking differently: Take 3 mLs by nebulization daily.  11/15/19  Yes Tanda Rockers, MD  aspirin 325 MG tablet Take 650 mg by mouth once.   Yes [provider]  atorvastatin (LIPITOR) 10 MG tablet TAKE 1 TABLET BY MOUTH DAILY AT 6PM Patient taking differently: Take by mouth daily.  12/19/19  Yes Josue Hector, MD  budesonide (PULMICORT) 0.25 MG/2ML nebulizer solution TAKE 2 MLS VIA NEBULIZER DAILY Patient taking differently: Take 2 mLs by nebulization daily.  04/22/20  Yes Tanda Rockers, MD  clonazePAM (KLONOPIN) 0.5 MG tablet Take 0.5 mg by mouth daily as needed for anxiety.  09/29/19  Yes [provider]  diphenhydrAMINE (BENADRYL) 50 MG tablet Take 50 mg by mouth as needed for allergies (bee stings).  12/16/15  Yes [provider]  ibuprofen (ADVIL) 200 MG tablet Take 400 mg by mouth every 6 (six) hours as needed for moderate pain.   Yes [provider]  ipratropium (ATROVENT) 0.02 % nebulizer solution TAKE 2.5 MLS VIA NEBULIZER DAILY Patient taking differently: Take 2.5 mLs by nebulization daily.  11/15/19  Yes Tanda Rockers,  MD  levothyroxine (SYNTHROID, LEVOTHROID) 112 MCG tablet Take 112 mcg by mouth daily.    Yes [provider]  mirtazapine (REMERON) 15 MG tablet Take 15 mg by mouth at bedtime. 04/26/20  Yes [provider]    Allergies    Fluconazole in dextrose, Gatifloxacin, Iodinated diagnostic agents, Ioxaglate, Quinolones, Tequin, Zithromax [azithromycin], and Codeine  Review of Systems   Review of Systems  Constitutional: Negative for appetite change.  HENT: Negative for congestion.   Cardiovascular: Negative for chest pain and leg swelling.  Genitourinary: Negative for enuresis.  Musculoskeletal: Negative for back pain.       Right foot pain.  Skin: Negative for rash.  Neurological: Negative for  weakness.  Psychiatric/Behavioral: Negative for confusion.    Physical Exam Updated Vital Signs BP (!) 152/97   Pulse 67   Temp 98.9 F (37.2 C) (Oral)   Resp 19   SpO2 97%   Physical Exam Vitals and nursing note reviewed.  HENT:     Head: Normocephalic.  Eyes:     Extraocular Movements: Extraocular movements intact.  Cardiovascular:     Rate and Rhythm: Regular rhythm.  Pulmonary:     Breath sounds: Normal breath sounds.  Abdominal:     Tenderness: There is no abdominal tenderness.  Musculoskeletal:     Cervical back: Neck supple.     Comments: No tenderness to either feet.  Does have paleness of toes on the right side.  Mildly delayed capillary refill was on the foot.  No palpable pulses on either feet but able to Doppler both dorsalis pedis and posterior tibial on left and able to Doppler posterior tibial on the right.  Palpable pulse on popliteal bilaterally.  Skin:    General: Skin is warm.     Capillary Refill: Capillary refill takes less than 2 seconds.  Neurological:     Mental Status: She is alert and oriented to person, place, and time.  Psychiatric:        Mood and Affect: Mood normal.     ED Results / Procedures / Treatments   Labs (all labs ordered are listed, but only abnormal results are displayed) Labs Reviewed  BASIC METABOLIC PANEL - Abnormal; Notable for the following components:      Result Value   Sodium 134 (*)    Glucose, Bld 107 (*)    Calcium 8.7 (*)    All other components within normal limits  SARS CORONAVIRUS 2 BY RT PCR [facility] ORDER, [place] [facility])  CBC    EKG EKG Interpretation  Date/Time:  Saturday April 27 2020 13:32:27 EDT Ventricular Rate:  69 PR Interval:    QRS Duration: 86 QT Interval:  399 QTC Calculation: 428 R Axis:   -15 Text Interpretation: Sinus rhythm Atrial premature complex Probable left atrial enlargement Borderline left axis deviation Confirmed by Davonna Belling [id number]) on  04/27/2020 4:03:09 PM   Radiology VAS [place] ABI WITH/WO TBI  Result Date: 04/27/2020 LOWER EXTREMITY DOPPLER STUDY Indications: Rest pain. High Risk Factors: Hyperlipidemia, past history of smoking.  Comparison Study: No prior study on file Performing Technologist: Sharion Dove RVS  Examination Guidelines: A complete evaluation includes at minimum, Doppler waveform signals and systolic blood pressure reading at the level of bilateral brachial, anterior tibial, and posterior tibial arteries, when vessel segments are accessible. Bilateral testing is considered an integral part of a complete examination. Photoelectric Plethysmograph (PPG) waveforms and toe systolic pressure readings are included as required and additional duplex  testing as needed. Limited examinations for reoccurring indications may be performed as noted.  ABI Findings: +---------+------------------+-----+-------------------+--------+ Right    Rt Pressure (mmHg)IndexWaveform           Comment  +---------+------------------+-----+-------------------+--------+ Brachial 157                    biphasic                    +---------+------------------+-----+-------------------+--------+ PTA      32                0.20 dampened monophasic         +---------+------------------+-----+-------------------+--------+ DP       50                0.32 dampened monophasic         +---------+------------------+-----+-------------------+--------+ Great Toe0                 0.00                             +---------+------------------+-----+-------------------+--------+ +---------+------------------+-----+----------+-------+ Left     Lt Pressure (mmHg)IndexWaveform  Comment +---------+------------------+-----+----------+-------+ Brachial 147                    triphasic         +---------+------------------+-----+----------+-------+ PTA      152               0.97 monophasic         +---------+------------------+-----+----------+-------+ DP       138               0.88 monophasic        +---------+------------------+-----+----------+-------+ Great Toe72                0.46                   +---------+------------------+-----+----------+-------+ +-------+-----------+-----------+------------+------------+ ABI/TBIToday's ABIToday's TBIPrevious ABIPrevious TBI +-------+-----------+-----------+------------+------------+ Right  0.32       0                                   +-------+-----------+-----------+------------+------------+ Left   0.97       0.46                                +-------+-----------+-----------+------------+------------+  Summary: Right: Resting right ankle-brachial index indicates severe right lower extremity arterial disease. The right toe-brachial index is abnormal. Left: Resting left ankle-brachial index is within normal range. No evidence of significant left lower extremity arterial disease. The left toe-brachial index is abnormal.  *See table(s) above for measurements and observations.     Preliminary    VAS [place] LOWER EXTREMITY ARTERIAL DUPLEX  Result Date: 04/27/2020 LOWER EXTREMITY ARTERIAL DUPLEX STUDY Indications: Rest pain, and Abnormal ABIs. High Risk Factors: Hyperlipidemia, past history of smoking.  Current ABI: R:0.32 L:.97 Comparison Study: No prior study on file for comparison Performing Technologist: Sharion Dove RVS  Examination Guidelines: A complete evaluation includes B-mode imaging, spectral Doppler, color Doppler, and power Doppler as needed of all accessible portions of each vessel. Bilateral testing is considered an integral part of a complete examination. Limited examinations for reoccurring indications may be performed as noted.  +----------+--------+-----+--------+----------+------------+ RIGHT     PSV cm/sRatioStenosisWaveform  Comments     +----------+--------+-----+--------+----------+------------+  CFA  Prox  69                                          +----------+--------+-----+--------+----------+------------+ DFA       100                                         +----------+--------+-----+--------+----------+------------+ SFA Prox                       absent                 +----------+--------+-----+--------+----------+------------+ SFA Mid                        absent                 +----------+--------+-----+--------+----------+------------+ SFA Distal22                   monophasicminimal flow +----------+--------+-----+--------+----------+------------+ POP Prox  14                   monophasic             +----------+--------+-----+--------+----------+------------+ POP Distal33                   monophasic             +----------+--------+-----+--------+----------+------------+ ATA Mid                        absent                 +----------+--------+-----+--------+----------+------------+ ATA Distal                     absent                 +----------+--------+-----+--------+----------+------------+ PTA Prox  23                   monophasic             +----------+--------+-----+--------+----------+------------+ PTA Mid   32                   monophasic             +----------+--------+-----+--------+----------+------------+ PTA Distal26                   monophasic             +----------+--------+-----+--------+----------+------------+  +--------+--------+-----+--------+-----------+--------+ LEFT    PSV cm/sRatioStenosisWaveform   Comments +--------+--------+-----+--------+-----------+--------+ CFA Prox113                  multiphasic         +--------+--------+-----+--------+-----------+--------+ DFA     96                   multiphasic         +--------+--------+-----+--------+-----------+--------+ SFA Prox62                   multiphasic          +--------+--------+-----+--------+-----------+--------+ PTA Prox67                   multiphasic         +--------+--------+-----+--------+-----------+--------+  Summary: Right: Total occlusion noted in the superficial femoral artery. Total occlusion of the right femoral artery with reconstitution of flow noted in the popliteal artery. Anterior tibial artery appears occluded. Posterior tibial artery appears occluded in the ankle.  See table(s) above for measurements and observations.    Preliminary     Procedures Procedures (including critical care time)  Medications Ordered in ED Medications - No data to display  ED Course  I have reviewed the triage vital signs and the nursing notes.  Pertinent labs & imaging results that were available during my care of the patient were reviewed by me and considered in my medical decision making (see chart for details).    MDM Rules/Calculators/A&P                         Patient with right foot pain.  Decreased perfusion.  No claudication history up to this.  However does have decreased flow on the ABI and duplex.  Seen by vascular surgery.  No clear cause and will require further evaluation.  Will be admitted by Dr. Scot Dock.  Does have IV dye allergy so will require pretreatment.  Will be admitted. Final Clinical Impression(s) / ED Diagnoses Final diagnoses:  Limb ischemia    Rx / DC Orders ED Discharge Orders    None       Davonna Belling, MD 04/27/20 [phone]

## 2020-04-27 NOTE — ED Notes (Signed)
Patient transported to Ultrasound 

## 2020-04-27 NOTE — Progress Notes (Signed)
VASCULAR LAB    ABIs and right lower extremity arterial duplex completed.    Preliminary report:  See CV proc for preliminary results.  Gave Dr. Alvino Chapel report  Mauro Kaufmann, Karess Harner, RVT 04/27/2020, 3:15 PM

## 2020-04-27 NOTE — ED Notes (Signed)
Attempted report x 2 

## 2020-04-28 ENCOUNTER — Encounter (HOSPITAL_COMMUNITY): Payer: Self-pay | Admitting: Vascular Surgery

## 2020-04-28 ENCOUNTER — Inpatient Hospital Stay (HOSPITAL_COMMUNITY): Payer: Medicare Other

## 2020-04-28 ENCOUNTER — Encounter (HOSPITAL_COMMUNITY)
Admission: RE | Disposition: A | Discharge status: Home / Self Care | Payer: Self-pay | Source: Home / Self Care | Attending: Vascular Surgery

## 2020-04-28 ENCOUNTER — Inpatient Hospital Stay (HOSPITAL_COMMUNITY): Payer: Medicare Other | Admitting: Anesthesiology

## 2020-04-28 HISTORY — PX: INTRAOPERATIVE ARTERIOGRAM: SHX5157

## 2020-04-28 HISTORY — PX: THROMBECTOMY OF BYPASS GRAFT FEMORAL- POPLITEAL ARTERY: SHX6902

## 2020-04-28 LAB — HEPARIN LEVEL (UNFRACTIONATED)
Heparin Unfractionated: 0.54 [IU]/mL (ref 0.30–0.70)
Heparin Unfractionated: 0.6 [IU]/mL (ref 0.30–0.70)

## 2020-04-28 LAB — PREPARE RBC (CROSSMATCH)

## 2020-04-28 LAB — CBC
HCT: 43.2 % (ref 36.0–46.0)
Hemoglobin: 14.2 g/dL (ref 12.0–15.0)
MCH: 28.3 pg (ref 26.0–34.0)
MCHC: 32.9 g/dL (ref 30.0–36.0)
MCV: 86.1 fL (ref 80.0–100.0)
Platelets: 181 10*3/uL (ref 150–400)
RBC: 5.02 MIL/uL (ref 3.87–5.11)
RDW: 14.2 % (ref 11.5–15.5)
WBC: 7.9 10*3/uL (ref 4.0–10.5)
nRBC: 0 % (ref 0.0–0.2)

## 2020-04-28 LAB — ABO/RH: ABO/RH(D): O NEG

## 2020-04-28 LAB — SURGICAL PCR SCREEN
MRSA, PCR: NEGATIVE
Staphylococcus aureus: NEGATIVE

## 2020-04-28 SURGERY — THROMBECTOMY OF BYPASS GRAFT FEMORAL-POPLITEAL ARTERY
Anesthesia: General | Laterality: Right

## 2020-04-28 MED ORDER — 0.9 % SODIUM CHLORIDE (POUR BTL) OPTIME
TOPICAL | Status: DC | PRN
  Administered 2020-04-28: 2000 mL

## 2020-04-28 MED ORDER — AMISULPRIDE (ANTIEMETIC) 5 MG/2ML IV SOLN: 10.0000 mg | Freq: Once | INTRAVENOUS | Status: DC | PRN

## 2020-04-28 MED ORDER — DIPHENHYDRAMINE-ZINC ACETATE 2-0.1 % EX CREA
TOPICAL_CREAM | Freq: Three times a day (TID) | CUTANEOUS | Status: DC | PRN
  Filled 2020-04-28: qty 28

## 2020-04-28 MED ORDER — FENTANYL CITRATE (PF) 100 MCG/2ML IJ SOLN
INTRAMUSCULAR | Status: DC | PRN
  Administered 2020-04-28 (×5): 50 ug via INTRAVENOUS

## 2020-04-28 MED ORDER — BISACODYL 10 MG RE SUPP: 10.0000 mg | Freq: Every day | RECTAL | Status: DC | PRN

## 2020-04-28 MED ORDER — FENTANYL CITRATE (PF) 250 MCG/5ML IJ SOLN
INTRAMUSCULAR | Status: AC
  Filled 2020-04-28: qty 5

## 2020-04-28 MED ORDER — POTASSIUM CHLORIDE CRYS ER 20 MEQ PO TBCR: 20.0000 meq | EXTENDED_RELEASE_TABLET | Freq: Every day | ORAL | Status: DC | PRN

## 2020-04-28 MED ORDER — LIDOCAINE 2% (20 MG/ML) 5 ML SYRINGE
INTRAMUSCULAR | Status: DC | PRN
  Administered 2020-04-28: 60 mg via INTRAVENOUS

## 2020-04-28 MED ORDER — SUGAMMADEX SODIUM 200 MG/2ML IV SOLN
INTRAVENOUS | Status: DC | PRN
  Administered 2020-04-28: 200 mg via INTRAVENOUS

## 2020-04-28 MED ORDER — PHENYLEPHRINE HCL-NACL 10-0.9 MG/250ML-% IV SOLN
INTRAVENOUS | Status: DC | PRN
Start: 2020-04-28 — End: 2020-04-28
  Administered 2020-04-28: 25 ug/min via INTRAVENOUS

## 2020-04-28 MED ORDER — HYDROXYZINE HCL 10 MG PO TABS
10.0000 mg | ORAL_TABLET | Freq: Three times a day (TID) | ORAL | Status: DC | PRN
  Administered 2020-04-29: 10 mg via ORAL
  Filled 2020-04-28 (×3): qty 1

## 2020-04-28 MED ORDER — ONDANSETRON HCL 4 MG/2ML IJ SOLN
INTRAMUSCULAR | Status: DC | PRN
  Administered 2020-04-28: 4 mg via INTRAVENOUS

## 2020-04-28 MED ORDER — HEPARIN (PORCINE) 25000 UT/250ML-% IV SOLN
400.0000 [IU]/h | INTRAVENOUS | Status: DC
  Administered 2020-04-28: 400 [IU]/h via INTRAVENOUS

## 2020-04-28 MED ORDER — FENTANYL CITRATE (PF) 100 MCG/2ML IJ SOLN: 25.0000 ug | INTRAMUSCULAR | Status: DC | PRN

## 2020-04-28 MED ORDER — POLYETHYLENE GLYCOL 3350 17 G PO PACK: 17.0000 g | PACK | Freq: Every day | ORAL | Status: DC | PRN

## 2020-04-28 MED ORDER — IOHEXOL 350 MG/ML SOLN
100.0000 mL | Freq: Once | INTRAVENOUS | Status: AC | PRN
  Administered 2020-04-28: 100 mL via INTRAVENOUS

## 2020-04-28 MED ORDER — LACTATED RINGERS IV SOLN
INTRAVENOUS | Status: DC | PRN
Start: 2020-04-28 — End: 2020-04-28

## 2020-04-28 MED ORDER — DEXAMETHASONE SODIUM PHOSPHATE 10 MG/ML IJ SOLN
INTRAMUSCULAR | Status: DC | PRN
Start: 2020-04-28 — End: 2020-04-28
  Administered 2020-04-28: 10 mg via INTRAVENOUS

## 2020-04-28 MED ORDER — PAPAVERINE HCL 30 MG/ML IJ SOLN
INTRAMUSCULAR | Status: AC
  Filled 2020-04-28: qty 2

## 2020-04-28 MED ORDER — SODIUM CHLORIDE 0.9 % IV SOLN: 500.0000 mL | Freq: Once | INTRAVENOUS | Status: DC | PRN

## 2020-04-28 MED ORDER — PROPOFOL 10 MG/ML IV BOLUS
INTRAVENOUS | Status: DC | PRN
  Administered 2020-04-28: 100 mg via INTRAVENOUS

## 2020-04-28 MED ORDER — CEFAZOLIN SODIUM-DEXTROSE 2-4 GM/100ML-% IV SOLN
2.0000 g | INTRAVENOUS | Status: AC
  Administered 2020-04-28: 2 g via INTRAVENOUS
  Filled 2020-04-28: qty 100

## 2020-04-28 MED ORDER — CHLORHEXIDINE GLUCONATE 0.12 % MT SOLN: 15.0000 mL | Freq: Once | OROMUCOSAL | Status: AC

## 2020-04-28 MED ORDER — SODIUM CHLORIDE 0.9 % IV SOLN
INTRAVENOUS | Status: AC
  Filled 2020-04-28: qty 1.2

## 2020-04-28 MED ORDER — SODIUM CHLORIDE 0.9 % IV SOLN: INTRAVENOUS | Status: DC

## 2020-04-28 MED ORDER — CHLORHEXIDINE GLUCONATE 0.12 % MT SOLN
OROMUCOSAL | Status: AC
  Administered 2020-04-28: 15 mL via OROMUCOSAL
  Filled 2020-04-28: qty 15

## 2020-04-28 MED ORDER — MAGNESIUM SULFATE 2 GM/50ML IV SOLN: 2.0000 g | Freq: Every day | INTRAVENOUS | Status: DC | PRN

## 2020-04-28 MED ORDER — LACTATED RINGERS IV SOLN: INTRAVENOUS | Status: DC

## 2020-04-28 MED ORDER — DIPHENHYDRAMINE HCL 50 MG/ML IJ SOLN
INTRAMUSCULAR | Status: DC | PRN
Start: 2020-04-28 — End: 2020-04-28
  Administered 2020-04-28: 25 mg via INTRAVENOUS

## 2020-04-28 MED ORDER — CEFAZOLIN SODIUM-DEXTROSE 2-4 GM/100ML-% IV SOLN
2.0000 g | Freq: Three times a day (TID) | INTRAVENOUS | Status: AC
  Administered 2020-04-28 – 2020-04-29 (×2): 2 g via INTRAVENOUS
  Filled 2020-04-28 (×2): qty 100

## 2020-04-28 MED ORDER — PROTAMINE SULFATE 10 MG/ML IV SOLN
INTRAVENOUS | Status: DC | PRN
Start: 2020-04-28 — End: 2020-04-28
  Administered 2020-04-28: 30 mg via INTRAVENOUS

## 2020-04-28 MED ORDER — DIPHENHYDRAMINE HCL 25 MG PO CAPS
25.0000 mg | ORAL_CAPSULE | ORAL | Status: DC | PRN
  Administered 2020-04-28 – 2020-04-29 (×2): 25 mg via ORAL
  Filled 2020-04-28 (×2): qty 1

## 2020-04-28 MED ORDER — SODIUM CHLORIDE (PF) 0.9 % IJ SOLN
INTRAVENOUS | Status: DC | PRN
  Administered 2020-04-28: 30 mL via INTRAMUSCULAR

## 2020-04-28 MED ORDER — ASPIRIN EC 81 MG PO TBEC
81.0000 mg | DELAYED_RELEASE_TABLET | Freq: Every day | ORAL | Status: DC
  Administered 2020-04-29 – 2020-05-01 (×3): 81 mg via ORAL
  Filled 2020-04-28 (×3): qty 1

## 2020-04-28 MED ORDER — MORPHINE SULFATE (PF) 2 MG/ML IV SOLN: 2.0000 mg | INTRAVENOUS | Status: DC | PRN

## 2020-04-28 MED ORDER — HYDROCORTISONE NA SUCCINATE PF 100 MG IJ SOLR
INTRAMUSCULAR | Status: DC | PRN
Start: 2020-04-28 — End: 2020-04-28
  Administered 2020-04-28: 125 mg via INTRAVENOUS

## 2020-04-28 MED ORDER — PHENYLEPHRINE 40 MCG/ML (10ML) SYRINGE FOR IV PUSH (FOR BLOOD PRESSURE SUPPORT)
PREFILLED_SYRINGE | INTRAVENOUS | Status: DC | PRN
  Administered 2020-04-28: 80 ug via INTRAVENOUS

## 2020-04-28 MED ORDER — ROCURONIUM BROMIDE 10 MG/ML (PF) SYRINGE
PREFILLED_SYRINGE | INTRAVENOUS | Status: DC | PRN
  Administered 2020-04-28: 30 mg via INTRAVENOUS
  Administered 2020-04-28: 50 mg via INTRAVENOUS
  Administered 2020-04-28: 20 mg via INTRAVENOUS

## 2020-04-28 MED ORDER — SODIUM CHLORIDE (PF) 0.9 % IJ SOLN
INTRAMUSCULAR | Status: DC | PRN
  Administered 2020-04-28: 2 mL via SURGICAL_CAVITY

## 2020-04-28 MED ORDER — IPRATROPIUM-ALBUTEROL 0.5-2.5 (3) MG/3ML IN SOLN
3.0000 mL | Freq: Every day | RESPIRATORY_TRACT | Status: DC
  Administered 2020-04-28 – 2020-05-01 (×4): 3 mL via RESPIRATORY_TRACT
  Filled 2020-04-28 (×4): qty 3

## 2020-04-28 MED ORDER — HEPARIN (PORCINE) 25000 UT/250ML-% IV SOLN
INTRAVENOUS | Status: AC
  Filled 2020-04-28: qty 250

## 2020-04-28 MED ORDER — SODIUM CHLORIDE 0.9 % IV SOLN
INTRAVENOUS | Status: DC | PRN
  Administered 2020-04-28: 500 mL

## 2020-04-28 MED ORDER — HEPARIN SODIUM (PORCINE) 1000 UNIT/ML IJ SOLN
INTRAMUSCULAR | Status: DC | PRN
Start: 2020-04-28 — End: 2020-04-28
  Administered 2020-04-28: 7000 [IU] via INTRAVENOUS

## 2020-04-28 MED ORDER — PROPOFOL 10 MG/ML IV BOLUS
INTRAVENOUS | Status: AC
  Filled 2020-04-28: qty 20

## 2020-04-28 MED ORDER — DOCUSATE SODIUM 100 MG PO CAPS
100.0000 mg | ORAL_CAPSULE | Freq: Every day | ORAL | Status: DC
  Administered 2020-04-29 – 2020-05-01 (×3): 100 mg via ORAL
  Filled 2020-04-28 (×3): qty 1

## 2020-04-28 SURGICAL SUPPLY — 69 items
ADH SKN CLS APL DERMABOND .7 (GAUZE/BANDAGES/DRESSINGS) ×3
BANDAGE ESMARK 6X9 LF (GAUZE/BANDAGES/DRESSINGS) ×1 IMPLANT
BNDG ESMARK 6X9 LF (GAUZE/BANDAGES/DRESSINGS) ×2
CANISTER SUCT 3000ML PPV (MISCELLANEOUS) ×2 IMPLANT
CANNULA VESSEL 3MM 2 BLNT TIP (CANNULA) ×4 IMPLANT
CATH EMB 3FR 80CM (CATHETERS) IMPLANT
CATH EMB 4FR 80CM (CATHETERS) ×2 IMPLANT
CATH EMB 5FR 80CM (CATHETERS) IMPLANT
CLIP VESOCCLUDE MED 24/CT (CLIP) ×2 IMPLANT
CLIP VESOCCLUDE SM WIDE 24/CT (CLIP) ×6 IMPLANT
COVER WAND RF STERILE (DRAPES) ×2 IMPLANT
CUFF TOURN SGL QUICK 18X4 (TOURNIQUET CUFF) IMPLANT
CUFF TOURN SGL QUICK 24 (TOURNIQUET CUFF)
CUFF TOURN SGL QUICK 34 (TOURNIQUET CUFF) ×2
CUFF TOURN SGL QUICK 42 (TOURNIQUET CUFF) IMPLANT
CUFF TRNQT CYL 24X4X16.5-23 (TOURNIQUET CUFF) IMPLANT
CUFF TRNQT CYL 34X4.125X (TOURNIQUET CUFF) ×1 IMPLANT
DERMABOND ADVANCED (GAUZE/BANDAGES/DRESSINGS) ×3
DERMABOND ADVANCED .7 DNX12 (GAUZE/BANDAGES/DRESSINGS) ×3 IMPLANT
DRAIN CHANNEL 15F RND FF W/TCR (WOUND CARE) ×4 IMPLANT
DRAPE HALF SHEET 40X57 (DRAPES) IMPLANT
DRAPE X-RAY CASS 24X20 (DRAPES) ×4 IMPLANT
ELECT REM PT RETURN 9FT ADLT (ELECTROSURGICAL) ×2
ELECTRODE REM PT RTRN 9FT ADLT (ELECTROSURGICAL) ×1 IMPLANT
EVACUATOR SILICONE 100CC (DRAIN) ×4 IMPLANT
GAUZE SPONGE 2X2 8PLY STRL LF (GAUZE/BANDAGES/DRESSINGS) ×2 IMPLANT
GLOVE BIO SURGEON STRL SZ 6.5 (GLOVE) ×8 IMPLANT
GLOVE BIO SURGEON STRL SZ7.5 (GLOVE) ×4 IMPLANT
GLOVE BIOGEL PI IND STRL 6.5 (GLOVE) ×1 IMPLANT
GLOVE BIOGEL PI IND STRL 7.0 (GLOVE) ×1 IMPLANT
GLOVE BIOGEL PI IND STRL 8 (GLOVE) ×2 IMPLANT
GLOVE BIOGEL PI INDICATOR 6.5 (GLOVE) ×1
GLOVE BIOGEL PI INDICATOR 7.0 (GLOVE) ×1
GLOVE BIOGEL PI INDICATOR 8 (GLOVE) ×2
GLOVE SURG SS PI 7.5 STRL IVOR (GLOVE) ×6 IMPLANT
GOWN STRL REUS W/ TWL LRG LVL3 (GOWN DISPOSABLE) ×6 IMPLANT
GOWN STRL REUS W/TWL LRG LVL3 (GOWN DISPOSABLE) ×12
KIT BASIN OR (CUSTOM PROCEDURE TRAY) ×2 IMPLANT
KIT TURNOVER KIT B (KITS) ×2 IMPLANT
MARKER GRAFT CORONARY BYPASS (MISCELLANEOUS) ×2 IMPLANT
NS IRRIG 1000ML POUR BTL (IV SOLUTION) ×4 IMPLANT
PACK PERIPHERAL VASCULAR (CUSTOM PROCEDURE TRAY) ×2 IMPLANT
PAD ARMBOARD 7.5X6 YLW CONV (MISCELLANEOUS) ×4 IMPLANT
PAD CAST 4YDX4 CTTN HI CHSV (CAST SUPPLIES) ×1 IMPLANT
PADDING CAST COTTON 4X4 STRL (CAST SUPPLIES) ×2
SET COLLECT BLD 21X3/4 12 (NEEDLE) ×2 IMPLANT
SPONGE GAUZE 2X2 STER 10/PKG (GAUZE/BANDAGES/DRESSINGS) ×2
SPONGE SURGIFOAM ABS GEL 100 (HEMOSTASIS) IMPLANT
STOPCOCK 4 WAY LG BORE MALE ST (IV SETS) ×2 IMPLANT
SUT ETHILON 3 0 PS 1 (SUTURE) ×2 IMPLANT
SUT PROLENE 5 0 C 1 24 (SUTURE) ×2 IMPLANT
SUT PROLENE 6 0 BV (SUTURE) ×12 IMPLANT
SUT PROLENE 7 0 BV 1 (SUTURE) IMPLANT
SUT SILK 2 0 PERMA HAND 18 BK (SUTURE) ×2 IMPLANT
SUT SILK 2 0 SH (SUTURE) IMPLANT
SUT SILK 3 0 (SUTURE) ×4
SUT SILK 3-0 18XBRD TIE 12 (SUTURE) ×2 IMPLANT
SUT VIC AB 2-0 CTB1 (SUTURE) ×6 IMPLANT
SUT VIC AB 3-0 SH 27 (SUTURE) ×10
SUT VIC AB 3-0 SH 27X BRD (SUTURE) ×5 IMPLANT
SUT VICRYL 4-0 PS2 18IN ABS (SUTURE) ×10 IMPLANT
SYR 20ML LL LF (SYRINGE) ×2 IMPLANT
SYR 3ML LL SCALE MARK (SYRINGE) ×2 IMPLANT
TAPE CLOTH SURG 4X10 WHT LF (GAUZE/BANDAGES/DRESSINGS) ×4 IMPLANT
TOWEL GREEN STERILE (TOWEL DISPOSABLE) ×4 IMPLANT
TRAY FOLEY MTR SLVR 16FR STAT (SET/KITS/TRAYS/PACK) ×2 IMPLANT
TUBING EXTENTION W/L.L. (IV SETS) ×2 IMPLANT
UNDERPAD 30X36 HEAVY ABSORB (UNDERPADS AND DIAPERS) ×2 IMPLANT
WATER STERILE IRR 1000ML POUR (IV SOLUTION) ×2 IMPLANT

## 2020-04-28 NOTE — Progress Notes (Signed)
   VASCULAR SURGERY ASSESSMENT & PLAN:   ISCHEMIC RIGHT LOWER EXTREMITY: The patient continues to have paresthesias and pain in the right foot.  I have reviewed her CT angiogram this morning with Dr. Pascal Lux.  On the right side she has an abrupt occlusion of the proximal superficial femoral artery with reconstitution of the above-knee popliteal artery.  Again her history is puzzling in that she did not describe any claudication symptoms although she was fairly active.  She has no reason to of embolized and there is no evidence of embolic disease.  I suspect she has underlying disease of the superficial femoral artery that was asymptomatic and that she went on to thrombose this.  The infrarenal aorta is slightly ectatic so less likely this was an embolic event.  Regardless given her persistent symptoms with minimal blood flow to the right foot I have recommended we proceed with revascularization on the right.  This will most likely involve right femoral to above-knee popliteal artery bypass grafting.  She has two-vessel runoff on the right via the posterior tibial and peroneal artery.  I have discussed the indications for the procedure and the potential complications with the patient and she is agreeable to proceed.  I also discussed this with her daughter.   SUBJECTIVE:   Still with persistent pain and paresthesias in the right foot.  PHYSICAL EXAM:   Vitals:   04/27/20 2058 04/28/20 0017 04/28/20 0235 04/28/20 0419  BP: (!) 185/68 (!) 174/81  (!) 147/66  Pulse: 77 75  74  Resp: 19 20  16   Temp: 97.8 F (36.6 C) 97.8 F (36.6 C)  97.6 F (36.4 C)  TempSrc: Oral Oral  Oral  SpO2: 97% 96%  93%  Weight: 71.6 kg     Height:   5\' 2"  (1.575 m)    I can only get a peroneal signal on the right with the Doppler. She has slightly decreased sensation on the right motor function is intact. She has a normal femoral pulse.   LABS:   Lab Results  Component Value Date   WBC 7.9 04/28/2020   HGB  14.2 04/28/2020   HCT 43.2 04/28/2020   MCV 86.1 04/28/2020   PLT 181 04/28/2020   Lab Results  Component Value Date   CREATININE 0.84 04/27/2020   Lab Results  Component Value Date   INR 1.0 04/27/2020    PROBLEM LIST:    Active Problems:   PVD (peripheral vascular disease) (HCC)   CURRENT MEDS:   . atorvastatin  10 mg Oral Daily  . budesonide  2 mL Nebulization Daily  . ipratropium-albuterol  3 mL Nebulization Daily  . levothyroxine  112 mcg Oral QAC breakfast  . mirtazapine  15 mg Oral QHS  . pantoprazole  40 mg Oral Daily  . potassium chloride  20-40 mEq Oral Once    [name] Office: [phone] 04/28/2020

## 2020-04-28 NOTE — Plan of Care (Signed)
?  Problem: Clinical Measurements: ?Goal: Will remain free from infection ?Outcome: Progressing ?  ?

## 2020-04-28 NOTE — Anesthesia Procedure Notes (Signed)
Procedure Name: Intubation Date/Time: 04/28/2020 11:07 AM Performed by: Clearnce Sorrel, CRNA Pre-anesthesia Checklist: Patient identified, Emergency Drugs available, Suction available, Patient being monitored and Timeout performed Patient Re-evaluated:Patient Re-evaluated prior to induction Oxygen Delivery Method: Circle system utilized Preoxygenation: Pre-oxygenation with 100% oxygen Induction Type: IV induction Ventilation: Mask ventilation without difficulty Laryngoscope Size: Mac and 3 Grade View: Grade I Tube type: Oral Tube size: 7.0 mm Number of attempts: 1 Airway Equipment and Method: Stylet Placement Confirmation: ETT inserted through vocal cords under direct vision,  positive ETCO2 and breath sounds checked- equal and bilateral Secured at: 21 cm Tube secured with: Tape Dental Injury: Teeth and Oropharynx as per pre-operative assessment

## 2020-04-28 NOTE — Progress Notes (Signed)
Pt. Arrived to unit from PACU. BP 119/58 (74) SPO2 98 RA HR 75. Pt oriented to unit. Rash noted on pt. And given in report, will continue to monitor.   Phoebe Sharps, RN

## 2020-04-28 NOTE — Anesthesia Postprocedure Evaluation (Signed)
Anesthesia Post Note  Patient: Sharon Mcdaniel  Procedure(s) Performed: RIGHT FEMORAL-BELOW KNEE POPLITEAL BYPASS GRAFT USING NON-REVERSED GREATER SAPHENOUS VEIN GRAFT HARVESTED FROM RIGHT UPPER LEG.  (Right ) INTRA OPERATIVE ARTERIOGRAM (Right )     Patient location during evaluation: PACU Anesthesia Type: General Level of consciousness: awake and alert and confused Pain management: pain level controlled Vital Signs Assessment: post-procedure vital signs reviewed and stable Respiratory status: spontaneous breathing, nonlabored ventilation, respiratory function stable and patient connected to nasal cannula oxygen Cardiovascular status: blood pressure returned to baseline and stable Postop Assessment: no apparent nausea or vomiting Anesthetic complications: no   No complications documented.  Last Vitals:  Vitals:   04/28/20 1720 04/28/20 1800  BP: 136/60 (!) 119/58  Pulse: 78 75  Resp: 14   Temp: 36.7 C 37 C  SpO2: 99% 96%    Last Pain:  Vitals:   04/28/20 1800  TempSrc: Axillary  PainSc: 5                  Tiajuana Amass

## 2020-04-28 NOTE — OR Nursing (Signed)
At completion of procedure skin reassessed after surgical drapes removed.  It was noted that the redness was darker in color and had extended up to patient's chest, neck, bilateral shoulders down to upper arms anteriorly to the elbow, right groin, hip and thigh, and left hip down to knee extending posteriorly.  Renita Papa PACU RN notified.  Dr. Scot Dock also made aware.  PACU to continue to monitor patient.

## 2020-04-28 NOTE — Op Note (Signed)
NAME: KADIN BERA    MRN: [id number] DOB: [birth year]    DATE OF OPERATION: 04/28/2020  PREOP DIAGNOSIS:    Ischemic right lower extremity  POSTOP DIAGNOSIS:    Same  PROCEDURE:    1. Right femoral to below-knee popliteal artery bypass with nonreversed translocated saphenous vein graft 2. Intraoperative arteriogram  SURGEON: Judeth Cornfield. Scot Dock, MD  ASSIST: Leontine Locket, PA  ANESTHESIA: General  EBL: 100 cc  INDICATIONS:    Sharon Mcdaniel is a 84 y.o. female who would presented with a 2-day history of severe pain of the right leg.  She had an ABI of 30% with minimal flow in the foot.  She had no reason to have embolized and denied claudication although I suspect her activity was limited given her age.  CT angiogram showed in the superficial femoral artery occlusion with severe infrainguinal arterial occlusive disease.  She presents for bypass given her critical limb ischemia.  FINDINGS:   The patient had diffuse infrainguinal arterial occlusive disease.  She has tiny tibial vessels in her distal superficial femoral artery and popliteal artery was also significantly disease.  Ultimately went to the below-knee popliteal artery.  At the completion of the procedure she had a brisk peroneal signal and a dampened monophasic posterior tibial signal.  She has a known anterior tibial artery occlusion.  TECHNIQUE:   The patient was taken to the operating room and received a general anesthetic.  The right lower extremity was prepped and draped in usual sterile fashion.  A longitudinal incision was made in the right groin.  Through this incision the common femoral, deep femoral, and superficial femoral arteries were dissected free and controlled with Vesseloops.  The common femoral artery was soft.  The proximal superficial femoral artery was occluded with significant plaque suggesting chronic disease.  Next using 2 additional incisions along the medial aspect of the right leg the great  saphenous vein was harvested from the saphenofemoral junction to the distal thigh.  Branches were divided between clips and 3-0 silk ties.  Through the distal incision I exposed the above-knee popliteal artery.  I did initially find one patent segment but below that there was significant plaque.  I therefore felt we would need to go below the knee.  An additional incision was made below the knee and the great saphenous vein was harvested to the mid calf.  Again branches divided between clips and 3-0 silk ties.  Through this incision the below-knee popliteal artery was exposed.  Even this had significant plaque but I was able to get into a reasonable spot below the knee.  A tunnel was created from this incision to the groin incision.  Patient was heparinized.  The saphenofemoral junction was clamped.  The saphenous vein was excised from the femoral vein and the femoral vein oversewn with a 5-0 Prolene suture.  The proximal valve was sharply excised and the vein which was spatulated.  A cardiac marker ring was placed on the graft for the proximal anastomosis.  The vein was gently distended with heparinized saline.  Distally it became small to about 3.5 mm.  I used this in a nonreversed fashion.  The common femoral, deep femoral, and superficial femoral arteries were controlled.  A longitudinal arteriotomy was made in the common femoral artery.  The vein was spatulated and sewn into side to the artery using continuous 6-0 Prolene suture.  Next I used a retrograde Mills valvulotome to lyse the valves.  This was passed  twice and there was good flow established through the graft.  The graft was flushed with heparinized saline and then clamped.  It was marked to prevent twisting.  It was then brought to the previously created tunnel for anastomosis to the below-knee popliteal artery.  A tourniquet was placed on the thigh.  The leg was exsanguinated with an Esmarch bandage.  The tourniquet was inflated to 250 mmHg.  Under  tourniquet control a longitudinal arteriotomy was made in the below-knee popliteal artery.  The vein was spatulated and cut to the appropriate length and sewn end to side to the below-knee popliteal artery using continuous 6-0 Prolene suture.  Of note prior to completing anastomosis I did pass a 3.5 mm dilator through the anastomosis which was open.  The anastomosis was completed and flow reestablished to the leg.  At this point there was a reasonable peroneal signal but no posterior tibial signal.  I elected to do an intraoperative arteriogram.  Given her dye allergy she had received Benadryl and also was given Solu-Cortef.  Dilute contrast was used and the graft was cannulated proximally.  Completion film showed a patent graft with no technical issues.  The posterior tibial artery was very small as was the peroneal artery.  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  Hemostasis was obtained in the wounds.  The heparin was partially reversed with protamine.  The wounds were irrigated.  The groin incision was closed with a deep layer of 2-0 Vicryl, a subcutaneous layer with 3-0 Vicryl and the skin closed with 4-0 Vicryl.  Of note a 15 Blake drain was placed to include the proximal 2 incisions.  A separate 15 Blake drain was placed to include the distal 2 incisions.  The 2 vein harvest incisions were closed with 2 deep layers of 3-0 Vicryl and the skin closed with 4-0 Vicryl.  The below the knee incision was closed with a deep layer of 3-0 Vicryl, a subcutaneous layer of 3-0 Vicryl and the skin closed with 4-0 Vicryl.  At the completion was a brisk peroneal signal with the Doppler and a monophasic posterior tibial signal.  All needle and sponge counts were correct.  Sterile dressing was applied.  The patient tolerated the procedure well was transferred to the recovery room in stable condition.  Given the complexity of the case a first assistant was necessary in order to expedient the procedure and safely  perform the technical aspects of the operation.  [name], MD, FACS [facility]  DATE OF DICTATION:   04/28/2020

## 2020-04-28 NOTE — Anesthesia Preprocedure Evaluation (Signed)
Anesthesia Evaluation  Patient identified by MRN, date of birth, ID band Patient awake    Reviewed: Allergy & Precautions, NPO status , Patient's Chart, lab work & pertinent test results  Airway Mallampati: II  TM Distance: >3 FB Neck ROM: Full    Dental  (+) Dental Advisory Given   Pulmonary asthma , COPD, former smoker,    breath sounds clear to auscultation       Cardiovascular + Peripheral Vascular Disease   Rhythm:Regular Rate:Normal     Neuro/Psych negative neurological ROS     GI/Hepatic negative GI ROS, Neg liver ROS,   Endo/Other  Hypothyroidism   Renal/GU negative Renal ROS     Musculoskeletal   Abdominal   Peds  Hematology negative hematology ROS (+)   Anesthesia Other Findings   Reproductive/Obstetrics                             Anesthesia Physical Anesthesia Plan  ASA: III and emergent  Anesthesia Plan: General   Post-op Pain Management:    Induction: Intravenous  PONV Risk Score and Plan: 3 and Dexamethasone, Ondansetron and Treatment may vary due to age or medical condition  Airway Management Planned: Oral ETT  Additional Equipment:   Intra-op Plan:   Post-operative Plan: Extubation in OR  Informed Consent: I have reviewed the patients History and Physical, chart, labs and discussed the procedure including the risks, benefits and alternatives for the proposed anesthesia with the patient or authorized representative who has indicated his/her understanding and acceptance.     Dental advisory given  Plan Discussed with: CRNA  Anesthesia Plan Comments:         Anesthesia Quick Evaluation

## 2020-04-28 NOTE — Discharge Instructions (Signed)
 [facility] [place]  Discharge instructions  Lower Extremity Bypass Surgery  Please refer to the following instruction for your post-procedure care. Your surgeon or physician assistant will discuss any changes with you.  Activity  You are encouraged to walk as much as you can. You can slowly return to normal activities during the month after your surgery. Avoid strenuous activity and heavy lifting until your doctor tells you it's OK. Avoid activities such as vacuuming or swinging a golf club. Do not drive until your doctor give the OK and you are no longer taking prescription pain medications. It is also normal to have difficulty with sleep habits, eating and bowel movement after surgery. These will go away with time.  Bathing/Showering  Shower daily after you go home. Do not soak in a bathtub, hot tub, or swim until the incision heals completely.  Incision Care  Clean your incision with mild soap and water. Shower every day. Pat the area dry with a clean towel. You do not need a bandage unless otherwise instructed. Do not apply any ointments or creams to your incision. If you have open wounds you will be instructed how to care for them or a visiting nurse may be arranged for you. If you have staples or sutures along your incision they will be removed at your post-op appointment. You may have skin glue on your incision. Do not peel it off. It will come off on its own in about one week.  Wash the groin wound with soap and water daily and pat dry. (No tub bath-only shower)  Then put a dry gauze or washcloth in the groin to keep this area dry to help prevent wound infection.  Do this daily and as needed.  Do not use Vaseline or neosporin on your incisions.  Only use soap and water on your incisions and then protect and keep dry.  Diet  Resume your normal diet. There are no special food restrictions following this procedure. A low fat/ low cholesterol diet is  recommended for all patients with vascular disease. In order to heal from your surgery, it is CRITICAL to get adequate nutrition. Your body requires vitamins, minerals, and protein. Vegetables are the best source of vitamins and minerals. Vegetables also provide the perfect balance of protein. Processed food has little nutritional value, so try to avoid this.  Medications  Resume taking all your medications unless your doctor or physician assistant tells you not to. If your incision is causing pain, you may take over-the-counter pain relievers such as acetaminophen (Tylenol). If you were prescribed a stronger pain medication, please aware these medication can cause nausea and constipation. Prevent nausea by taking the medication with a snack or meal. Avoid constipation by drinking plenty of fluids and eating foods with high amount of fiber, such as fruits, vegetables, and grains. Take Colace 100 mg (an over-the-counter stool softener) twice a day as needed for constipation.  Do not take Tylenol if you are taking prescription pain medications.  Follow Up  Our office will schedule a follow up appointment 2-3 weeks following discharge.  Please call us immediately for any of the following conditions  Severe or worsening pain in your legs or feet while at rest or while walking Increase pain, redness, warmth, or drainage (pus) from your incision site(s) Fever of 101 degree or higher The swelling in your leg with the bypass suddenly worsens and becomes more painful than when you were in the hospital If you have   been instructed to feel your graft pulse then you should do so every day. If you can no longer feel this pulse, call the office immediately. Not all patients are given this instruction.  Leg swelling is common after leg bypass surgery.  The swelling should improve over a few months following surgery. To improve the swelling, you may elevate your legs above the level of your heart while you are  sitting or resting. Your surgeon or physician assistant may ask you to apply an ACE wrap or wear compression (TED) stockings to help to reduce swelling.  Reduce your risk of vascular disease  Stop smoking. If you would like help call [facility] at 1-800-QUIT-NOW (1-800-784-8669) or [place] at 336-586-4000.  Manage your cholesterol Maintain a desired weight Control your diabetes weight Control your diabetes Keep your blood pressure down  If you have any questions, please call the office at [phone]  

## 2020-04-28 NOTE — Progress Notes (Signed)
ANTICOAGULATION CONSULT NOTE  Pharmacy Consult for heparin Indication: Ischemic RLE   Assessment: 42 YOF who presents with a 2-day onset of pain in right leg concerning for ischemia. Pharmacy consulted to start IV heparin in preparation for CT angio with runoff. H/H and Plt wnl. SCr wnl.  Initial heparin level 0.54 units/ml  Goal of Therapy:  Heparin level 0.3-0.7 units/ml Monitor platelets by anticoagulation protocol: Yes   Plan:  -Continue heparin infusion at 950 units/hr  -Check heparin level later today to confirm -Monitor daily HL, CBC and s/s of bleeding  Thanks for allowing pharmacy to be a part of this patient's care.  Excell Seltzer, PharmD Clinical Pharmacist

## 2020-04-28 NOTE — OR Nursing (Signed)
During Intra-op skin assessment noted patient had redness from nipples to level of umbilicus bilaterally.  Dr. Scot Dock at bedside intraop and aware.  Patient stated topical betadine was ok to use for prep but due to redness pre-op 2% Chlorohexidine Gluconate used instead for surgical prep and Technicare Chloroxylenol 3% used for foley insertion prep.  Will continue to monitor redness.

## 2020-04-28 NOTE — Transfer of Care (Signed)
Immediate Anesthesia Transfer of Care Note  Patient: AMBRIELLA KITT  Procedure(s) Performed: RIGHT FEMORAL-BELOW KNEE POPLITEAL BYPASS GRAFT USING NON-REVERSED GREATER SAPHENOUS VEIN GRAFT HARVESTED FROM RIGHT UPPER LEG.  (Right ) INTRA OPERATIVE ARTERIOGRAM (Right )  Patient Location: PACU  Anesthesia Type:General  Level of Consciousness: awake and drowsy  Airway & Oxygen Therapy: Patient Spontanous Breathing and Patient connected to nasal cannula oxygen  Post-op Assessment: Report given to RN and Post -op Vital signs reviewed and stable  Post vital signs: Reviewed and stable  Last Vitals:  Vitals Value Taken Time  BP 169/70 04/28/20 1605  Temp    Pulse 82 04/28/20 1606  Resp 13 04/28/20 1606  SpO2 100 % 04/28/20 1606  Vitals shown include unvalidated device data.  Last Pain:  Vitals:   04/28/20 0901  TempSrc: Oral  PainSc:          Complications: No complications documented.

## 2020-04-29 ENCOUNTER — Encounter (HOSPITAL_COMMUNITY): Payer: Self-pay | Admitting: Vascular Surgery

## 2020-04-29 LAB — BASIC METABOLIC PANEL
Anion gap: 9 (ref 5–15)
BUN: 12 mg/dL (ref 8–23)
CO2: 22 mmol/L (ref 22–32)
Calcium: 7.6 mg/dL — ABNORMAL LOW (ref 8.9–10.3)
Chloride: 104 mmol/L (ref 98–111)
Creatinine, Ser: 0.91 mg/dL (ref 0.44–1.00)
GFR calc Af Amer: >60 mL/min (ref >60)
GFR calc non Af Amer: 59 mL/min — ABNORMAL LOW (ref >60)
Glucose, Bld: 116 mg/dL — ABNORMAL HIGH (ref 70–99)
Potassium: 3.9 mmol/L (ref 3.5–5.1)
Sodium: 135 mmol/L (ref 135–145)

## 2020-04-29 LAB — CBC
HCT: 32.8 % — ABNORMAL LOW (ref 36.0–46.0)
Hemoglobin: 10.7 g/dL — ABNORMAL LOW (ref 12.0–15.0)
MCH: 28.5 pg (ref 26.0–34.0)
MCHC: 32.6 g/dL (ref 30.0–36.0)
MCV: 87.5 fL (ref 80.0–100.0)
Platelets: 171 10*3/uL (ref 150–400)
RBC: 3.75 MIL/uL — ABNORMAL LOW (ref 3.87–5.11)
RDW: 14.9 % (ref 11.5–15.5)
WBC: 14.4 10*3/uL — ABNORMAL HIGH (ref 4.0–10.5)
nRBC: 0 % (ref 0.0–0.2)

## 2020-04-29 LAB — LIPID PANEL
Cholesterol: 119 mg/dL (ref 0–200)
HDL: 67 mg/dL (ref >40)
LDL Cholesterol: 42 mg/dL (ref 0–99)
Total CHOL/HDL Ratio: 1.8 RATIO
Triglycerides: 51 mg/dL (ref <150)
VLDL: 10 mg/dL (ref 0–40)

## 2020-04-29 LAB — HEPATIC FUNCTION PANEL
ALT: 9 U/L (ref 0–44)
AST: 17 U/L (ref 15–41)
Albumin: 2.7 g/dL — ABNORMAL LOW (ref 3.5–5.0)
Alkaline Phosphatase: 67 U/L (ref 38–126)
Bilirubin, Direct: <0.1 mg/dL (ref 0.0–0.2)
Total Bilirubin: 0.3 mg/dL (ref 0.3–1.2)
Total Protein: 4.9 g/dL — ABNORMAL LOW (ref 6.5–8.1)

## 2020-04-29 LAB — HEPARIN LEVEL (UNFRACTIONATED): Heparin Unfractionated: <0.1 IU/mL — ABNORMAL LOW (ref 0.30–0.70)

## 2020-04-29 MED ORDER — SODIUM CHLORIDE 0.45 % IV SOLN: INTRAVENOUS | Status: DC

## 2020-04-29 MED ORDER — ENOXAPARIN SODIUM 40 MG/0.4ML ~~LOC~~ SOLN
40.0000 mg | SUBCUTANEOUS | Status: DC
  Administered 2020-04-29 – 2020-05-01 (×3): 40 mg via SUBCUTANEOUS
  Filled 2020-04-29 (×3): qty 0.4

## 2020-04-29 MED ORDER — DIPHENHYDRAMINE HCL 25 MG PO CAPS
50.0000 mg | ORAL_CAPSULE | Freq: Four times a day (QID) | ORAL | Status: DC
  Administered 2020-04-29 – 2020-05-01 (×7): 50 mg via ORAL
  Filled 2020-04-29 (×7): qty 2

## 2020-04-29 MED ORDER — HYDROCORTISONE NA SUCCINATE PF 100 MG IJ SOLR
100.0000 mg | Freq: Four times a day (QID) | INTRAMUSCULAR | Status: DC
  Administered 2020-04-29 – 2020-05-01 (×7): 100 mg via INTRAVENOUS
  Filled 2020-04-29 (×7): qty 2

## 2020-04-29 MED ORDER — CLONAZEPAM 0.5 MG PO TABS
0.5000 mg | ORAL_TABLET | Freq: Every day | ORAL | Status: DC | PRN
  Administered 2020-04-29: 0.5 mg via ORAL
  Filled 2020-04-29: qty 1

## 2020-04-29 MED ORDER — ATORVASTATIN CALCIUM 40 MG PO TABS
40.0000 mg | ORAL_TABLET | Freq: Every day | ORAL | Status: DC
  Administered 2020-04-29 – 2020-04-30 (×2): 40 mg via ORAL
  Filled 2020-04-29 (×2): qty 1

## 2020-04-29 NOTE — Progress Notes (Signed)
Physical Therapy Treatment Patient Details Name: Sharon Mcdaniel MRN: [id number] DOB: July 26, 1936 Today's Date: 04/29/2020    History of Present Illness Pt adm with rt leg pain. Pt found to have ischemic RLE due to superficial femoral artery occlusion with severe infrainguinal arterial occlusive disease. Pt underwent rt femoral to below knee popliteal artery bypass on 04/28/20. PMH - COPD    PT Comments    Returned this PM to attempt further amb due to limited in AM due to nause. On standing at EOB pt became lightheaded had to sit after 20-30 sec. BP in sitting 90/58 after several minutes of sitting. Pressure earlier 128/58 so suspect she was orthostatic. Returned to supine and nurse aware.    Follow Up Recommendations  No PT follow up     Equipment Recommendations  None recommended by PT    Recommendations for Other Services       Precautions / Restrictions Precautions Precautions: Fall Precaution Comments: JP drains    Mobility  Bed Mobility Overal bed mobility: Needs Assistance Bed Mobility: Supine to Sit;Sit to Supine     Supine to sit: Min assist Sit to supine: Min assist   General bed mobility comments: Assist with RLE.   Transfers Overall transfer level: Needs assistance Equipment used: 4-wheeled walker Transfers: Sit to/from Stand Sit to Stand: Supervision         General transfer comment: Slow to rise but no physical assist needed.   Ambulation/Gait Ambulation/Gait assistance: Supervision Gait Distance (Feet): 5 Feet Assistive device: None Gait Pattern/deviations: Step-through pattern;Decreased step length - right;Decreased step length - left;Antalgic Gait velocity: decr Gait velocity interpretation: <1.31 ft/sec, indicative of household ambulator General Gait Details: Unable due to light headedness.    Stairs             Wheelchair Mobility    Modified Rankin (Stroke Patients Only)       Balance Overall balance assessment: Needs  assistance Sitting-balance support: No upper extremity supported;Feet supported Sitting balance-Leahy Scale: Good     Standing balance support: Bilateral upper extremity supported Standing balance-Leahy Scale: Poor Standing balance comment: UE support due to lightheaded. Stood x 20-30 sec before returning to sitting.                            Cognition Arousal/Alertness: Awake/alert Behavior During Therapy: WFL for tasks assessed/performed Overall Cognitive Status: Within Functional Limits for tasks assessed                                        Exercises      General Comments General comments (skin integrity, edema, etc.): Lightheaded with standing. On return to sitting BP 90/58 after sitting for several minutes.       Pertinent Vitals/Pain Pain Assessment: Faces Faces Pain Scale: Hurts little more Pain Location: R LE Pain Descriptors / Indicators: Sore Pain Intervention(s): Monitored during session;Repositioned    Home Living Family/patient expects to be discharged to:: Private residence Living Arrangements: Children Available Help at Discharge: Family;Available PRN/intermittently Type of Home: House Home Access: Stairs to enter   Home Layout: One level Home Equipment: Environmental consultant - 2 wheels;Wheelchair - [employer];Shower seat      Prior Function Level of Independence: Independent      Comments: drives   PT Goals (current goals can now be found in the care plan section) Acute Rehab  PT Goals Patient Stated Goal: return home PT Goal Formulation: With patient Time For Goal Achievement: 05/06/20 Potential to Achieve Goals: Good Progress towards PT goals: Not progressing toward goals - comment (lightheaded)    Frequency    Min 3X/week      PT Plan Current plan remains appropriate    Co-evaluation              AM-PAC PT "6 Clicks" Mobility   Outcome Measure  Help needed turning from your back to your side while  in a flat bed without using bedrails?: None Help needed moving from lying on your back to sitting on the side of a flat bed without using bedrails?: A Little Help needed moving to and from a bed to a chair (including a wheelchair)?: A Little Help needed standing up from a chair using your arms (e.g., wheelchair or bedside chair)?: A Little Help needed to walk in hospital room?: A Little Help needed climbing 3-5 steps with a railing? : A Little 6 Click Score: 19    End of Session   Activity Tolerance: Treatment limited secondary to medical complications (Comment) (lightheaded and likely orthostatic) Patient left: in bed;with call bell/phone within reach Nurse Communication: Other (comment) (Lightheaded and decr BP. ) PT Visit Diagnosis: Other abnormalities of gait and mobility (R26.89)     Time: 8185-9093 PT Time Calculation (min) (ACUTE ONLY): 13 min  Charges:  $Therapeutic Activity: 8-22 mins                     [place] Pager [phone] Office [place] 04/29/2020, 4:51 PM

## 2020-04-29 NOTE — Progress Notes (Signed)
ANTICOAGULATION CONSULT NOTE - Initial Consult  Pharmacy Consult for heparin > enox ppx  Indication: Ischemic RLE    Patient Measurements: Height: 5\' 2"  (157.5 cm) Weight: 71.6 kg (157 lb 12.8 oz) IBW/kg (Calculated) : 50.1 Heparin Dosing Weight: 62.1 kg   Vital Signs: Temp: 98.2 F (36.8 C) (06/28 0348) Temp Source: Oral (06/28 0348) BP: 108/43 (06/28 0348) Pulse Rate: 84 (06/28 0348)  Labs: Recent Labs    04/27/20 1357 04/27/20 1357 04/27/20 1702 04/27/20 1702 04/28/20 0123 04/28/20 0939 04/29/20 0312  HGB 13.3   < > 12.8   < > 14.2  --  10.7*  HCT 41.2   < > 40.3  --  43.2  --  32.8*  PLT 173   < > 157  --  181  --  171  LABPROT  --   --  12.8  --   --   --   --   INR  --   --  1.0  --   --   --   --   HEPARINUNFRC  --   --   --   --  0.54 0.60 <0.10*  CREATININE 0.84  --   --   --   --   --  0.91   < > = values in this interval not displayed.    Estimated Creatinine Clearance: 44.9 mL/min (by C-G formula based on SCr of 0.91 mg/dL).   Assessment: Sharon Mcdaniel who presents with a 2-day onset of pain in right leg concerning for ischemia. Pharmacy consulted for heparin post-op with MD dosing.   HL undetectable this morning. H/H and Plt dropped post-op, no active bleeding noted. Discussed anticoagulation with MD who said ok to convert to enoxaparin prophylaxis dosing given good RLE blood flow.   Goal of Therapy:  Monitor platelets by anticoagulation protocol: Yes   Plan:  Stop Heparin infusion, will DC consult  Start enoxaparin 40 mg daily per MD  Monitor CBC and s/s of bleeding   Benetta Spar, PharmD, BCPS, [facility] Clinical Pharmacist  Please check AMION for all [place] phone numbers After 10:00 PM, call [place] [phone]

## 2020-04-29 NOTE — Plan of Care (Signed)
  Problem: Clinical Measurements: Goal: Will remain free from infection Outcome: Progressing Goal: Diagnostic test results will improve Outcome: Progressing Goal: Respiratory complications will improve Outcome: Progressing Goal: Cardiovascular complication will be avoided Outcome: Progressing   

## 2020-04-29 NOTE — Progress Notes (Signed)
PHARMACIST LIPID MONITORING   Sharon Mcdaniel is a 84 y.o. female admitted on 04/27/2020 with RLE Ischemia s/p arterial bypass.  Pharmacy has been consulted to optimize lipid-lowering therapy with the indication of secondary prevention for clinical ASCVD.  Recent Labs:  Lipid Panel (last 6 months):   Lab Results  Component Value Date   CHOL 119 04/29/2020   TRIG 51 04/29/2020   HDL 67 04/29/2020   CHOLHDL 1.8 04/29/2020   VLDL 10 04/29/2020   LDLCALC 42 04/29/2020    Hepatic function panel (last 6 months):   Lab Results  Component Value Date   AST 17 04/29/2020   ALT 9 04/29/2020   ALKPHOS 67 04/29/2020   BILITOT 0.3 04/29/2020   BILIDIR <0.1 04/29/2020   IBILI NOT CALCULATED 04/29/2020    SCr (since admission):   Serum creatinine: 0.91 mg/dL 04/29/20 7517 Estimated creatinine clearance: 44.9 mL/min  Current lipid-lowering therapy: Atorvastatin 10mg  Documented or reported allergies or intolerances to lipid-lowering therapies (if applicable): none, confirmed with patient  Assessment:  Patient agrees with changes to lipid-lowering therapy  Recommendation per protocol:  Increase intensity or dose of current statin.  Follow-up with:  Primary care provider - Hayden Rasmussen, MD  Follow-up labs after discharge:    Liver function panel and lipid panel in 8-12 weeks then annually  Plan: Increase atorvastatin to 40mg  daily  Monitor for signs of myalgia Increased statin discharge order is pended   Benetta Spar, PharmD, BCPS, [place] Pharmacist  Please check AMION for all [place] phone numbers After 10:00 PM, call [place]

## 2020-04-29 NOTE — Progress Notes (Signed)
   VASCULAR SURGERY ASSESSMENT & PLAN:   POD 1 RIGHT FEMORAL TO BELOW-KNEE POPLITEAL ARTERY BYPASS WITH VEIN: She has a brisk peroneal dorsalis pedis signal with a Doppler.  She has a monophasic posterior tibial signal.  Her incisions look fine.  Mobilize.  JP 1 drain 15 cc last shift, JP to drain 25 cc last shift.  Will DC JPs.   SUBJECTIVE:   No specific complaints.  PHYSICAL EXAM:   Vitals:   04/28/20 1800 04/28/20 1932 04/29/20 0021 04/29/20 0348  BP: (!) 119/58 104/75 116/66 (!) 108/43  Pulse: 75 84 92 84  Resp:  20 20 18   Temp: 98.6 F (37 C) 98.1 F (36.7 C) 97.6 F (36.4 C) 98.2 F (36.8 C)  TempSrc: Axillary Oral Oral Oral  SpO2: 96% 97% 95% 95%  Weight:      Height:       Her incisions look fine.  She reportedly had some drainage from the upper thigh incision and a dressing was placed. She has a brisk dorsalis pedis and peroneal signal with the Doppler.  She has a monophasic posterior tibial signal. Her calves are soft.  LABS:   Lab Results  Component Value Date   WBC 14.4 (H) 04/29/2020   HGB 10.7 (L) 04/29/2020   HCT 32.8 (L) 04/29/2020   MCV 87.5 04/29/2020   PLT 171 04/29/2020   Lab Results  Component Value Date   CREATININE 0.91 04/29/2020   Lab Results  Component Value Date   INR 1.0 04/27/2020    PROBLEM LIST:    Active Problems:   PVD (peripheral vascular disease) (HCC)   CURRENT MEDS:   . aspirin EC  81 mg Oral Q0600  . atorvastatin  10 mg Oral Daily  . budesonide  2 mL Nebulization Daily  . docusate sodium  100 mg Oral Daily  . ipratropium-albuterol  3 mL Nebulization Daily  . levothyroxine  112 mcg Oral QAC breakfast  . mirtazapine  15 mg Oral QHS  . pantoprazole  40 mg Oral Daily  . potassium chloride  20-40 mEq Oral Once    [name] Office: [phone] 04/29/2020

## 2020-04-29 NOTE — Progress Notes (Addendum)
Progress Note    04/29/2020 4:55 PM 1 Day Post-Op  Subjective:  Called to bedside re: worsening skin inflammation secondary to contrast allergy.  She is complaining of skin burning of her face.  Her RN has noticed worsening redness and swelling of the thighs and lower abdomen. The patient denies SOB or itching.  Benadryl and Vistaril not providing relief.  The patient was premedicated with prednisone 50 mg yesterday morning prior to her CTA.  She then received Decadron prior to surgery.  An intraoperative arteriogram was performed and she received Solu-Cortef IV.   Vitals:   04/29/20 0811 04/29/20 0900  BP:  (!) 124/58  Pulse:  83  Resp:  15  Temp:  97.9 F (36.6 C)  SpO2: 96% 100%    Physical Exam: General appearance: Well-developed, well-nourished female no apparent distress.  Speech is clear.  No difficulty swallowing HEENT: No tongue or lip edema Cardiac:  RRR Lungs:  Nonlabored; no wheezing Incisions: RLE incisions well approximated Integument: erythema of face, trunk, upper extremities and thighs  CBC    Component Value Date/Time   WBC 14.4 (H) 04/29/2020 0312   RBC 3.75 (L) 04/29/2020 0312   HGB 10.7 (L) 04/29/2020 0312   HCT 32.8 (L) 04/29/2020 0312   PLT 171 04/29/2020 0312   MCV 87.5 04/29/2020 0312   MCH 28.5 04/29/2020 0312   MCHC 32.6 04/29/2020 0312   RDW 14.9 04/29/2020 0312   LYMPHSABS 1.9 06/10/2016 1712   MONOABS 0.9 06/10/2016 1712   EOSABS 0.1 06/10/2016 1712   BASOSABS 0.1 06/10/2016 1712    BMET    Component Value Date/Time   NA 135 04/29/2020 0312   K 3.9 04/29/2020 0312   CL 104 04/29/2020 0312   CO2 22 04/29/2020 0312   GLUCOSE 116 (H) 04/29/2020 0312   BUN 12 04/29/2020 0312   CREATININE 0.91 04/29/2020 0312   CALCIUM 7.6 (L) 04/29/2020 0312   GFRNONAA 59 (L) 04/29/2020 0312   GFRAA >60 04/29/2020 0312     Intake/Output Summary (Last 24 hours) at 04/29/2020 1655 Last data filed at 04/29/2020 1600 Gross per 24 hour  Intake  291.25 ml  Output 661 ml  Net -369.75 ml    HOSPITAL MEDICATIONS Scheduled Meds: . aspirin EC  81 mg Oral Q0600  . atorvastatin  40 mg Oral QHS  . budesonide  2 mL Nebulization Daily  . docusate sodium  100 mg Oral Daily  . enoxaparin (LOVENOX) injection  40 mg Subcutaneous Q24H  . ipratropium-albuterol  3 mL Nebulization Daily  . levothyroxine  112 mcg Oral QAC breakfast  . mirtazapine  15 mg Oral QHS  . pantoprazole  40 mg Oral Daily  . potassium chloride  20-40 mEq Oral Once   Continuous Infusions: . sodium chloride    . sodium chloride 75 mL/hr at 04/28/20 1810  . magnesium sulfate bolus IVPB     PRN Meds:.sodium chloride, acetaminophen **OR** acetaminophen, alum & mag hydroxide-simeth, bisacodyl, diphenhydrAMINE, diphenhydrAMINE-zinc acetate, guaiFENesin-dextromethorphan, hydrALAZINE, hydrOXYzine, labetalol, magnesium sulfate bolus IVPB, metoprolol tartrate, morphine injection, ondansetron, oxyCODONE-acetaminophen, phenol, polyethylene glycol, potassium chloride  Assessment: 84 year old female status post right femoral to below-knee popliteal artery bypass with vein graft secondary to ischemic right lower extremity 6/27.  She has a history of IV contrast allergy and is experiencing a skin reaction.  No upper airway involvement. Discussed with Dr. Oneida Alar.    Plan: -We will give Solu-Cortef 100 mg IV now and schedule Benadryl DVT prophylaxis:  Lovenox   Janiyla Long,  PA-C [facility] [phone] 04/29/2020  4:55 PM

## 2020-04-29 NOTE — Progress Notes (Signed)
Patient c/o of itching even after benadryl given. Notified Dr. Scot Dock, orders given for atarax and benadryl cream.   Patient has some relief. Will continue to monitor

## 2020-04-29 NOTE — Evaluation (Signed)
Occupational Therapy Evaluation Patient Details Name: Sharon Mcdaniel MRN: [id number] DOB: 07/24/1936 Today's Date: 04/29/2020    History of Present Illness Pt adm with rt leg pain. Pt found to have ischemic RLE due to superficial femoral artery occlusion with severe infrainguinal arterial occlusive disease. Pt underwent rt femoral to below knee popliteal artery bypass on 04/28/20. PMH - COPD   Clinical Impression   Pt was independent prior to admission. Presents with mild soreness in R LE limiting ability to access R foot for bathing and dressing and leading to mild balance deficits requiring hand held assist initially upon standing. Pt likely to progress well and be able to return home with her supportive family. Will follow acutely.    Follow Up Recommendations  No OT follow up    Equipment Recommendations  None recommended by OT    Recommendations for Other Services       Precautions / Restrictions Precautions Precautions: Fall Restrictions Weight Bearing Restrictions: No Other Position/Activity Restrictions: 2 JP drains      Mobility Bed Mobility               General bed mobility comments: pt received in bathroom  Transfers Overall transfer level: Needs assistance Equipment used: None Transfers: Sit to/from Stand Sit to Stand: Supervision         General transfer comment: slow to rise, use of grab bar    Balance Overall balance assessment: Needs assistance   Sitting balance-Leahy Scale: Normal       Standing balance-Leahy Scale: Fair                             ADL either performed or assessed with clinical judgement   ADL Overall ADL's : Needs assistance/impaired Eating/Feeding: Independent   Grooming: Supervision/safety;Standing;Wash/dry hands   Upper Body Bathing: Set up;Sitting   Lower Body Bathing: Minimal assistance;Sit to/from stand   Upper Body Dressing : Set up;Sitting   Lower Body Dressing: Minimal assistance;Sit  to/from stand   Toilet Transfer: Minimal assistance;Ambulation   Toileting- Clothing Manipulation and Hygiene: Modified independent;Sitting/lateral lean       Functional mobility during ADLs: Minimal assistance (hand held) General ADL Comments: pain interfering with ability to reach R foot     Vision Patient Visual Report: No change from baseline       Perception     Praxis      Pertinent Vitals/Pain Pain Assessment: Faces Faces Pain Scale: Hurts a little bit Pain Location: R LE Pain Descriptors / Indicators: Sore Pain Intervention(s): Monitored during session;Repositioned     Hand Dominance Right   Extremity/Trunk Assessment Upper Extremity Assessment Upper Extremity Assessment: Overall WFL for tasks assessed   Lower Extremity Assessment Lower Extremity Assessment: Defer to PT evaluation       Communication Communication Communication: No difficulties   Cognition Arousal/Alertness: Awake/alert Behavior During Therapy: WFL for tasks assessed/performed Overall Cognitive Status: Within Functional Limits for tasks assessed                                     General Comments       Exercises     Shoulder Instructions      Home Living Family/patient expects to be discharged to:: Private residence Living Arrangements: Children Available Help at Discharge: Family;Available PRN/intermittently Type of Home: House Home Access: Stairs to enter [employer] of Steps: 2  Home Layout: One level     Bathroom Shower/Tub: Teacher, early years/pre: Handicapped height     Home Equipment: Environmental consultant - 2 wheels;Wheelchair - [employer];Shower seat          Prior Functioning/Environment Level of Independence: Independent        Comments: drives        OT Problem List: Impaired balance (sitting and/or standing)      OT Treatment/Interventions: Self-care/ADL training;DME and/or AE instruction;Patient/family  education    OT Goals(Current goals can be found in the care plan section) Acute Rehab OT Goals Patient Stated Goal: return to PLOF OT Goal Formulation: With patient Time For Goal Achievement: 05/06/20 Potential to Achieve Goals: Good ADL Goals Pt Will Transfer to Toilet: with modified independence;ambulating;regular height toilet Pt Will Perform Tub/Shower Transfer: Tub transfer;with modified independence;ambulating Additional ADL Goal #1: Pt will be modified independent in LB ADL.  OT Frequency: Min 2X/week   Barriers to D/C:            Co-evaluation              AM-PAC OT "6 Clicks" Daily Activity     Outcome Measure Help from another person eating meals?: None Help from another person taking care of personal grooming?: A Little Help from another person toileting, which includes using toliet, bedpan, or urinal?: A Little Help from another person bathing (including washing, rinsing, drying)?: A Little Help from another person to put on and taking off regular upper body clothing?: None Help from another person to put on and taking off regular lower body clothing?: A Little 6 Click Score: 20   End of Session    Activity Tolerance: Patient tolerated treatment well Patient left: in chair;with call bell/phone within reach  OT Visit Diagnosis: Other abnormalities of gait and mobility (R26.89);Pain                Time: 9163-8466 OT Time Calculation (min): 15 min Charges:  OT General Charges $OT Visit: 1 Visit OT Evaluation $OT Eval Low Complexity: 1 Low  {Cartel Mauss, [place] 04/29/2020, 10:02 AM  Nestor Lewandowsky, OTR/L Acute Rehabilitation Services Pager: [phone] Office: [phone]

## 2020-04-29 NOTE — Evaluation (Signed)
Physical Therapy Evaluation Patient Details Name: Sharon Mcdaniel MRN: [id number] DOB: 1936-04-14 Today's Date: 04/29/2020   History of Present Illness  Pt adm with rt leg pain. Pt found to have ischemic RLE due to superficial femoral artery occlusion with severe infrainguinal arterial occlusive disease. Pt underwent rt femoral to below knee popliteal artery bypass on 04/28/20. PMH - COPD  Clinical Impression  Pt presents to PT with decr mobility as expected after surgery.  Expect pt will make good progress back to baseline with mobility. Will follow acutely but doubt pt will need PT after DC. Amb distances limited this AM by nausea.     Follow Up Recommendations No PT follow up    Equipment Recommendations  None recommended by PT    Recommendations for Other Services       Precautions / Restrictions Precautions Precautions: Fall Precaution Comments: JP drains      Mobility  Bed Mobility Overal bed mobility: Modified Independent             General bed mobility comments: Sit to supine  Transfers Overall transfer level: Needs assistance Equipment used: None Transfers: Sit to/from Stand Sit to Stand: Supervision         General transfer comment: Slow to rise but no physical assist needed.   Ambulation/Gait Ambulation/Gait assistance: Supervision Gait Distance (Feet): 5 Feet Assistive device: None Gait Pattern/deviations: Step-through pattern;Decreased step length - right;Decreased step length - left;Antalgic Gait velocity: decr Gait velocity interpretation: <1.31 ft/sec, indicative of household ambulator General Gait Details: Limited gait due to nausea and pt returned to bed. Pt reaching with UE for furniture.   Stairs            Wheelchair Mobility    Modified Rankin (Stroke Patients Only)       Balance Overall balance assessment: Needs assistance Sitting-balance support: No upper extremity supported;Feet supported Sitting balance-Leahy Scale:  Good     Standing balance support: No upper extremity supported;During functional activity Standing balance-Leahy Scale: Fair                               Pertinent Vitals/Pain Pain Assessment: Faces Faces Pain Scale: Hurts a little bit Pain Location: R LE Pain Descriptors / Indicators: Sore Pain Intervention(s): Monitored during session;Repositioned    Home Living Family/patient expects to be discharged to:: Private residence Living Arrangements: Children Available Help at Discharge: Family;Available PRN/intermittently Type of Home: House Home Access: Stairs to enter   Entrance Stairs-Number of Steps: 2 Home Layout: One level Home Equipment: Walker - 2 wheels;Wheelchair - [employer];Shower seat      Prior Function Level of Independence: Independent         Comments: drives     Hand Dominance   Dominant Hand: Right    Extremity/Trunk Assessment   Upper Extremity Assessment Upper Extremity Assessment: Overall WFL for tasks assessed    Lower Extremity Assessment Lower Extremity Assessment: RLE deficits/detail RLE Deficits / Details: limited by soreness       Communication   Communication: No difficulties  Cognition Arousal/Alertness: Awake/alert Behavior During Therapy: WFL for tasks assessed/performed Overall Cognitive Status: Within Functional Limits for tasks assessed                                        General Comments      Exercises  Assessment/Plan    PT Assessment Patient needs continued PT services  PT Problem List Decreased activity tolerance;Decreased mobility;Decreased balance;Pain       PT Treatment Interventions DME instruction;Gait training;Stair training;Functional mobility training;Therapeutic activities;Therapeutic exercise;Patient/family education;Balance training    PT Goals (Current goals can be found in the Care Plan section)  Acute Rehab PT Goals Patient Stated Goal: return  home PT Goal Formulation: With patient Time For Goal Achievement: 05/06/20 Potential to Achieve Goals: Good    Frequency Min 3X/week   Barriers to discharge        Co-evaluation               AM-PAC PT "6 Clicks" Mobility  Outcome Measure Help needed turning from your back to your side while in a flat bed without using bedrails?: None Help needed moving from lying on your back to sitting on the side of a flat bed without using bedrails?: None Help needed moving to and from a bed to a chair (including a wheelchair)?: None Help needed standing up from a chair using your arms (e.g., wheelchair or bedside chair)?: None Help needed to walk in hospital room?: A Little Help needed climbing 3-5 steps with a railing? : A Little 6 Click Score: 22    End of Session   Activity Tolerance: Other (comment) (limited by nausea) Patient left: in bed;with call bell/phone within reach Nurse Communication: Other (comment) (nausea - told nurse tech) PT Visit Diagnosis: Other abnormalities of gait and mobility (R26.89)    Time: 5456-2563 PT Time Calculation (min) (ACUTE ONLY): 14 min   Charges:   PT Evaluation $PT Eval Low Complexity: 1 Low          [place] Pager [phone] Office [place] 04/29/2020, 1:52 PM

## 2020-04-30 ENCOUNTER — Inpatient Hospital Stay (HOSPITAL_COMMUNITY): Payer: Medicare Other

## 2020-04-30 DIAGNOSIS — Z9889 Other specified postprocedural states: Secondary | ICD-10-CM

## 2020-04-30 LAB — CBC
HCT: 31.7 % — ABNORMAL LOW (ref 36.0–46.0)
Hemoglobin: 10.2 g/dL — ABNORMAL LOW (ref 12.0–15.0)
MCH: 28.4 pg (ref 26.0–34.0)
MCHC: 32.2 g/dL (ref 30.0–36.0)
MCV: 88.3 fL (ref 80.0–100.0)
Platelets: 138 10*3/uL — ABNORMAL LOW (ref 150–400)
RBC: 3.59 MIL/uL — ABNORMAL LOW (ref 3.87–5.11)
RDW: 15.4 % (ref 11.5–15.5)
WBC: 15 10*3/uL — ABNORMAL HIGH (ref 4.0–10.5)
nRBC: 0 % (ref 0.0–0.2)

## 2020-04-30 NOTE — Progress Notes (Signed)
ABI has been completed.   Preliminary results in CV Proc.   Abram Sander 04/30/2020 10:01 AM

## 2020-04-30 NOTE — Progress Notes (Addendum)
  Progress Note    04/30/2020 7:36 AM 2 Days Post-Op  Subjective:  States she feels much better. No longer having burning sensation of her face and skin. Right leg pain well controlled. States she had BM late last night   Vitals:   04/30/20 0323 04/30/20 0711  BP: (!) 126/54 (!) 123/47  Pulse: 85 82  Resp: 16 20  Temp: 98.3 F (36.8 C) 97.7 F (36.5 C)  SpO2: 97% 96%   Physical Exam: Cardiac: regular Lungs: non labored Incisions:  Right lower extremity incisions clean, dry and intact. There is about 40 cc of serosanguinous drainage in both JP drains in right thigh Extremities:  2+ femoral pulses bilaterally, bilateral feet warm and well perfused. Doppler DP/ PT/Pero signals bilaterally. Motor and sensory intact Abdomen:  Soft, non tender, non distended  Neurologic: alert and oriented Skin: mild diffuse erythematous rash of face, chest, abdomen, upper arms and upper thighs bilaterally  CBC    Component Value Date/Time   WBC 15.0 (H) 04/30/2020 0316   RBC 3.59 (L) 04/30/2020 0316   HGB 10.2 (L) 04/30/2020 0316   HCT 31.7 (L) 04/30/2020 0316   PLT 138 (L) 04/30/2020 0316   MCV 88.3 04/30/2020 0316   MCH 28.4 04/30/2020 0316   MCHC 32.2 04/30/2020 0316   RDW 15.4 04/30/2020 0316   LYMPHSABS 1.9 06/10/2016 1712   MONOABS 0.9 06/10/2016 1712   EOSABS 0.1 06/10/2016 1712   BASOSABS 0.1 06/10/2016 1712    BMET    Component Value Date/Time   NA 135 04/29/2020 0312   K 3.9 04/29/2020 0312   CL 104 04/29/2020 0312   CO2 22 04/29/2020 0312   GLUCOSE 116 (H) 04/29/2020 0312   BUN 12 04/29/2020 0312   CREATININE 0.91 04/29/2020 0312   CALCIUM 7.6 (L) 04/29/2020 0312   GFRNONAA 59 (L) 04/29/2020 0312   GFRAA >60 04/29/2020 0312    INR    Component Value Date/Time   INR 1.0 04/27/2020 1702     Intake/Output Summary (Last 24 hours) at 04/30/2020 0736 Last data filed at 04/30/2020 0300 Gross per 24 hour  Intake 691.43 ml  Output 81 ml  Net 610.43 ml      Assessment/Plan:  84 y.o. female is s/p post right femoral to below-knee popliteal artery bypass with vein graft secondary to ischemic right lower extremity 6/272 Days Post-Op. Right lower extremity is well perfused with Doppler DP/ Pero/ PT signals. Right foot is warm with motor and sensation intact. Incisions are intact. She still has quite a bit of output in JP drains. They were suppose to be d/c yesterday but still with >40 cc in each this morning. She had worsening diffuse rash and edema yesterday afternoon which is now improved with scheduled benadryl and IV solu-Cortef. She is tolerating diet and has moved her bowels. Mobilize today  DVT prophylaxis:  Lovenox   Karoline Caldwell, [place] [facility] [phone] 04/30/2020 7:36 AM   I have interviewed the patient and examined the patient. I agree with the findings by the PA.  The JPs drained 10 cc and 15 cc the last shift.  Discontinue JP drains Discontinue IV fluid. Physical therapy has not recommended any further outpatient therapy.  Anticipate discharge tomorrow.  Gae Gallop, MD [phone]

## 2020-04-30 NOTE — Progress Notes (Signed)
Physical Therapy Treatment Patient Details Name: Sharon Mcdaniel MRN: [id number] DOB: 1936/02/21 Today's Date: 04/30/2020    History of Present Illness Pt adm with rt leg pain. Pt found to have ischemic RLE due to superficial femoral artery occlusion with severe infrainguinal arterial occlusive disease. Pt underwent rt femoral to below knee popliteal artery bypass on 04/28/20. PMH - COPD    PT Comments    Pt feeling much better today. Able to amb in hallway. Expect continued progress and return home.    Follow Up Recommendations  No PT follow up     Equipment Recommendations  None recommended by PT    Recommendations for Other Services       Precautions / Restrictions Precautions Precautions: Fall    Mobility  Bed Mobility Overal bed mobility: Modified Independent Bed Mobility: Supine to Sit     Supine to sit: Modified independent (Device/Increase time)     General bed mobility comments: Incr time and effort  Transfers Overall transfer level: Modified independent Equipment used: None;4-wheeled walker Transfers: Sit to/from Stand Sit to Stand: Modified independent (Device/Increase time)         General transfer comment: Slow to rise  Ambulation/Gait Ambulation/Gait assistance: Supervision;Modified independent (Device/Increase time) Gait Distance (Feet): 225 Feet Assistive device: 4-wheeled walker;None Gait Pattern/deviations: Step-through pattern;Decreased stride length;Antalgic;Decreased stance time - right Gait velocity: decr Gait velocity interpretation: 1.31 - 2.62 ft/sec, indicative of limited community ambulator General Gait Details: Modified independent in room without assistive device. Supervision in hall with rollator. Incr step length with incr distance as RLE loosened up.    Stairs             Wheelchair Mobility    Modified Rankin (Stroke Patients Only)       Balance Overall balance assessment: Needs assistance Sitting-balance support:  No upper extremity supported;Feet supported Sitting balance-Leahy Scale: Normal     Standing balance support: No upper extremity supported;During functional activity Standing balance-Leahy Scale: Good                              Cognition Arousal/Alertness: Awake/alert Behavior During Therapy: WFL for tasks assessed/performed Overall Cognitive Status: Within Functional Limits for tasks assessed                                        Exercises      General Comments General comments (skin integrity, edema, etc.): HR to 124 with amb. SpO2 98%      Pertinent Vitals/Pain Pain Assessment: Faces Faces Pain Scale: Hurts little more Pain Location: R LE Pain Descriptors / Indicators: Sore Pain Intervention(s): Monitored during session;Repositioned    Home Living                      Prior Function            PT Goals (current goals can now be found in the care plan section) Acute Rehab PT Goals Patient Stated Goal: return home Progress towards PT goals: Progressing toward goals    Frequency    Min 3X/week      PT Plan Current plan remains appropriate    Co-evaluation              AM-PAC PT "6 Clicks" Mobility   Outcome Measure  Help needed turning from your back to your side while  in a flat bed without using bedrails?: None Help needed moving from lying on your back to sitting on the side of a flat bed without using bedrails?: None Help needed moving to and from a bed to a chair (including a wheelchair)?: None Help needed standing up from a chair using your arms (e.g., wheelchair or bedside chair)?: None Help needed to walk in hospital room?: None Help needed climbing 3-5 steps with a railing? : A Little 6 Click Score: 23    End of Session   Activity Tolerance: Patient tolerated treatment well Patient left: in chair;with call bell/phone within reach Nurse Communication: Mobility status PT Visit Diagnosis: Other  abnormalities of gait and mobility (R26.89)     Time: 1010-1035 PT Time Calculation (min) (ACUTE ONLY): 25 min  Charges:  $Gait Training: 23-37 mins                     [place] Pager [phone] Office [place] 04/30/2020, 11:07 AM

## 2020-04-30 NOTE — Progress Notes (Signed)
Mobility Specialist - Progress Note   04/30/20 1324  Mobility  Activity Ambulated in hall  Level of Assistance Modified independent, requires aide device or extra time  Assistive Device Front wheel walker  Distance Ambulated (ft) 250 ft  Mobility Response Tolerated well  Mobility performed by Mobility specialist  $Mobility charge 1 Mobility    Pre-mobility: 83 HR, 93% SpO2 Post-mobility: 84 HR, 98% SpO2   Anchor Dwan Solicitor

## 2020-04-30 NOTE — Progress Notes (Signed)
Both JP drains removed. Pt tolerated well. Bandage applied. 15 cc of drainage in Drain 1 and drain 2 for a total of 30cc in both. Jerald Kief, RN

## 2020-04-30 NOTE — Care Management Important Message (Signed)
Important Message  Patient Details  Name: Sharon Mcdaniel MRN: [id number] Date of Birth: 01-09-1936   Medicare Important Message Given:  Yes     Shelda Altes 04/30/2020, 9:54 AM

## 2020-05-01 MED ORDER — OXYCODONE-ACETAMINOPHEN 5-325 MG PO TABS: 1.0000 | ORAL_TABLET | Freq: Four times a day (QID) | ORAL | 0 refills | Status: DC | PRN

## 2020-05-01 MED ORDER — ATORVASTATIN CALCIUM 40 MG PO TABS: 40.0000 mg | ORAL_TABLET | Freq: Every day | ORAL | 3 refills | Status: DC

## 2020-05-01 NOTE — Progress Notes (Addendum)
[facility] [place]  Subjective  - Doing well and ready to go home.     Objective (!) 154/63 78 97.9 F (36.6 C) (Oral) 19 97%  Intake/Output Summary (Last 24 hours) at 05/01/2020 0732 Last data filed at 04/30/2020 0109 Gross per 24 hour  Intake --  Output 30 ml  Net -30 ml   Doppler DP/PT/Peroneal signals B LE Right groin soft with well healing incision.  JP exit site with clean dry dressing minimal drainage per patient over night. Lungs non labored breathing Heart RRR   Assessment/Planning: POD # 3 84 y.o. female is s/p post right femoral to below-knee popliteal artery bypass with vein graft secondary to ischemic right lower extremity   Patent bypass with brisk signals on doppler No home health needs recommended per PT Plan for discharge today and f/u in 2 weeks with Dr. Everrett Coombe 05/01/2020 7:32 AM --  Laboratory Lab Results: Recent Labs    04/29/20 0312 04/30/20 0316  WBC 14.4* 15.0*  HGB 10.7* 10.2*  HCT 32.8* 31.7*  PLT 171 138*   BMET Recent Labs    04/29/20 0312  NA 135  K 3.9  CL 104  CO2 22  GLUCOSE 116*  BUN 12  CREATININE 0.91  CALCIUM 7.6*    COAG Lab Results  Component Value Date   INR 1.0 04/27/2020   INR 0.96 07/23/2011   No results found for: PTT  I have interviewed the patient and examined the patient. I agree with the findings by the PA.  Gae Gallop, MD [phone]

## 2020-05-01 NOTE — Progress Notes (Signed)
PT Cancellation Note  Patient Details Name: Sharon Mcdaniel MRN: [id number] DOB: [birth year]   Cancelled Treatment:    Reason Eval/Treat Not Completed: Other (comment). Pt states she already walked on the hallway with the other therapist, declines tx today.  Follow as pt requires.   Ramond Dial 05/01/2020, 11:36 AM   Mee Hives, PT MS Acute Rehab Dept. Number: [place] and [place]

## 2020-05-01 NOTE — Progress Notes (Signed)
Occupational Therapy Treatment Patient Details Name: Sharon Mcdaniel MRN: [id number] DOB: 12-30-1935 Today's Date: 05/01/2020    History of present illness Pt adm with rt leg pain. Pt found to have ischemic RLE due to superficial femoral artery occlusion with severe infrainguinal arterial occlusive disease. Pt underwent rt femoral to below knee popliteal artery bypass on 04/28/20. PMH - COPD   OT comments  Pt progressing well towards acute OT goals. Able to walk household distances this session with only 1 brief standing rest break. Utilized rw this session. Pt with DOE 1/4, recovered in a timely manner at rest. Provided energy conservation education and strategies towards safely building back activity tolerance during ADLs. D/c plan remains appropriate.   Follow Up Recommendations  No OT follow up    Equipment Recommendations  None recommended by OT    Recommendations for Other Services      Precautions / Restrictions Precautions Precautions: Fall Restrictions Weight Bearing Restrictions: No       Mobility Bed Mobility Overal bed mobility: Modified Independent Bed Mobility: Supine to Sit           General bed mobility comments: Incr time and effort  Transfers Overall transfer level: Modified independent Equipment used: None Transfers: Sit to/from Stand Sit to Stand: Modified independent (Device/Increase time)         General transfer comment: Slow to rise. to/from EOB and recliner    Balance Overall balance assessment: Needs assistance Sitting-balance support: No upper extremity supported;Feet supported Sitting balance-Leahy Scale: Normal     Standing balance support: No upper extremity supported;During functional activity Standing balance-Leahy Scale: Good Standing balance comment: rw for walking due to DOE                            ADL either performed or assessed with clinical judgement   ADL Overall ADL's : Needs assistance/impaired                          Toilet Transfer: Supervision/safety;Ambulation;RW   Toileting- Clothing Manipulation and Hygiene: Modified independent;Sitting/lateral lean       Functional mobility during ADLs: Min guard;Rolling walker General ADL Comments: Pt completed household distance functional mobility utilizing rw and needing only 1 brief standing rest break. Provided education on handout on energy conservation strategies. DOE 1/4, recovered quickly with pause in activity. Sat on RA 97 post ambulation.      Vision       Perception     Praxis      Cognition Arousal/Alertness: Awake/alert Behavior During Therapy: WFL for tasks assessed/performed Overall Cognitive Status: Within Functional Limits for tasks assessed                                          Exercises     Shoulder Instructions       General Comments      Pertinent Vitals/ Pain       Pain Assessment: No/denies pain  Home Living                                          Prior Functioning/Environment              Frequency  Min 2X/week  Progress Toward Goals  OT Goals(current goals can now be found in the care plan section)  Progress towards OT goals: Progressing toward goals  Acute Rehab OT Goals Patient Stated Goal: return home OT Goal Formulation: With patient Time For Goal Achievement: 05/06/20 Potential to Achieve Goals: Good ADL Goals Pt Will Transfer to Toilet: with modified independence;ambulating;regular height toilet Pt Will Perform Tub/Shower Transfer: Tub transfer;with modified independence;ambulating Additional ADL Goal #1: Pt will be modified independent in LB ADL.  Plan Discharge plan remains appropriate    Co-evaluation                 AM-PAC OT "6 Clicks" Daily Activity     Outcome Measure   Help from another person eating meals?: None Help from another person taking care of personal grooming?: None Help from another  person toileting, which includes using toliet, bedpan, or urinal?: A Little Help from another person bathing (including washing, rinsing, drying)?: A Little Help from another person to put on and taking off regular upper body clothing?: None Help from another person to put on and taking off regular lower body clothing?: A Little 6 Click Score: 21    End of Session Equipment Utilized During Treatment: Rolling walker  OT Visit Diagnosis: Other abnormalities of gait and mobility (R26.89);Pain   Activity Tolerance Patient tolerated treatment well   Patient Left in chair;with call bell/phone within reach   Nurse Communication          Time: 7048-8891 OT Time Calculation (min): 20 min  Charges: OT General Charges $OT Visit: 1 Visit OT Treatments $Self Care/Home Management : 8-22 mins  Tyrone Schimke, OT Acute Rehabilitation Services Pager: [phone] Office: [phone]    Hortencia Pilar 05/01/2020, 11:30 AM

## 2020-05-02 NOTE — Discharge Summary (Signed)
Bypass Discharge Summary Patient ID: Sharon Mcdaniel [id number] 84 y.o. 1936/08/09  Admit date: 04/27/2020  Discharge date and time: 05/01/2020  2:05 PM   Admitting Physician: Angelia Mould, MD   Discharge Physician: same  Admission Diagnoses: PVD (peripheral vascular disease) ([place]) [I73.9] Limb ischemia [I99.8]  Discharge Diagnoses: same  Admission Condition: fair  Discharged Condition: fair  Indication for Admission: ischemic right lower extremity  Hospital Course: Ms. Sharon Mcdaniel is a 84 year old female who presented to the emergency department on 04/27/2020 with right leg pain.  Work-up included CTA demonstrating an occluded SFA.  She was admitted to the hospital and underwent right femoral to below the knee popliteal bypass with vein by Dr. Scot Dock the following day on 04/28/2020.  She tolerated procedure well.  The remainder of hospital stay was uneventful and consisted of postoperative pain control and increasing mobility.  Throughout her hospital stay she maintained a DP, PT, and peroneal signal by Doppler in her right lower extremity.  She will follow-up in office with Dr. Scot Dock in 2 to 3 weeks.  She was prescribed 2 to 3 days of narcotic pain medication for continued postoperative pain control.  It should also be noted that she was evaluated by physical therapy and they did not recommend any home health or physical therapy.  She was discharged home in stable condition.  Consults: None  Treatments: surgery: R femoral to BK popliteal bypass with vein by Dr. Scot Dock 04/28/20    Disposition: Discharge disposition: 01-Home or Self Care       - For [facility] Registry use ---  Post-op:  Wound infection: No  Graft infection: No  Transfusion: No   New Arrhythmia: No Patency judged by: [x ] Dopper only, [ ]  Palpable graft pulse, [ ]  Palpable distal pulse, [ ]  ABI inc. > 0.15, [ ]  Duplex D/C Ambulatory Status: Ambulatory  Complications: MI: [x ] No, [ ]  Troponin only, [  ] EKG or Clinical CHF: No Resp failure: [ x] none, [ ]  Pneumonia, [ ]  Ventilator Chg in renal function: [x ] none, [ ]  Inc. Cr > 0.5, [ ]  Temp. Dialysis, [ ]  Permanent dialysis Stroke: [x ] None, [ ]  Minor, [ ]  Major Return to OR: No  Reason for return to OR: [ ]  Bleeding, [ ]  Infection, [ ]  Thrombosis, [ ]  Revision  Discharge medications: Statin use:  Yes ASA use:  Yes Plavix use:  No  for medical reason not indicated Beta blocker use: No  for medical reason not indicated Coumadin use: No  for medical reason not indicated    Patient Instructions:  Allergies as of 05/01/2020      Reactions   Fluconazole In Dextrose Hives, Shortness Of Breath   Gatifloxacin Hives, Shortness Of Breath   Iodinated Diagnostic Agents Hives, Shortness Of Breath   States she tolerates betadine   Ioxaglate Hives, Shortness Of Breath   Quinolones Hives, Shortness Of Breath   Tequin Hives, Shortness Of Breath   Zithromax [azithromycin]    GI Upset   Codeine Other (See Comments)   Makes her jittery      Medication List    TAKE these medications   albuterol 1.25 MG/3ML nebulizer solution Commonly known as: ACCUNEB TAKE 3 MLS VIA NEBULIZER DAILY What changed: See the new instructions. Notes to patient: For wheezing or shortness of breath   aspirin 325 MG tablet Take 650 mg by mouth once. Notes to patient: To prevent clots   atorvastatin 40 MG tablet Commonly  known as: LIPITOR Take 1 tablet (40 mg total) by mouth at bedtime. What changed:   medication strength  See the new instructions. Notes to patient: **DOSE INCREASED** To lower cholesterol and decrease artery inflammation. Watch for unexplained muscle aches/pains and speak with a doctor if this occurs    budesonide 0.25 MG/2ML nebulizer solution Commonly known as: PULMICORT TAKE 2 MLS VIA NEBULIZER DAILY What changed: See the new instructions. Notes to patient: To improve breathing   clonazePAM 0.5 MG tablet Commonly known as:  KLONOPIN Take 0.5 mg by mouth daily as needed for anxiety. Notes to patient: For anxiety.  May cause drowsiness and dizziness    diphenhydrAMINE 50 MG tablet Commonly known as: BENADRYL Take 50 mg by mouth as needed for allergies (bee stings). Notes to patient: For allergies.  May cause drowsiness and dizziness    ibuprofen 200 MG tablet Commonly known as: ADVIL Take 400 mg by mouth every 6 (six) hours as needed for moderate pain. Notes to patient: For pain. Take with food to prevent stomach ulcers   ipratropium 0.02 % nebulizer solution Commonly known as: ATROVENT TAKE 2.5 MLS VIA NEBULIZER DAILY What changed: See the new instructions. Notes to patient: To improve breathing   levothyroxine 112 MCG tablet Commonly known as: SYNTHROID Take 112 mcg by mouth daily. Notes to patient: Thyroid supplement   mirtazapine 15 MG tablet Commonly known as: REMERON Take 15 mg by mouth at bedtime. Notes to patient: For sleep    oxyCODONE-acetaminophen 5-325 MG tablet Commonly known as: PERCOCET/ROXICET Take 1 tablet by mouth every 6 (six) hours as needed for moderate pain. Notes to patient: **NEW** For severe pain. May cause drowsiness, dizziness and constipation. May use over-the-counter docusate, miralax or senna for constipation      Activity: activity as tolerated Diet: regular diet Wound Care: keep wound clean and dry  Follow-up with Dr. Scot Dock 05/22/20  Signed: Dagoberto Ligas 05/02/2020 8:36 AM

## 2020-05-03 ENCOUNTER — Telehealth: Payer: Self-pay

## 2020-05-03 LAB — TYPE AND SCREEN
ABO/RH(D): O NEG
Antibody Screen: NEGATIVE
Unit division: 0
Unit division: 0
Unit division: 0

## 2020-05-03 LAB — BPAM RBC
Blood Product Expiration Date: 202107082359
Blood Product Expiration Date: 202107082359
Blood Product Expiration Date: 202107152359
ISSUE DATE / TIME: 202106240740
ISSUE DATE / TIME: 202106291929
ISSUE DATE / TIME: 202107010946
Unit Type and Rh: 9500
Unit Type and Rh: 9500
Unit Type and Rh: 9500

## 2020-05-03 NOTE — Telephone Encounter (Signed)
Pt called with c/o noticing increased swelling in R foot/lower leg. She denies any pain, warmth, redness. Incision looks good. She has been up walking around more and PA feels this is normal. Reassured pt of this and encouraged her to elevate her leg straight out in front of her propped up on pillows. Pt verbalized understanding and will call us back if anything changes.

## 2020-05-10 ENCOUNTER — Ambulatory Visit: Payer: Medicare Other | Admitting: Internal Medicine

## 2020-05-22 ENCOUNTER — Encounter: Payer: Self-pay | Admitting: Vascular Surgery

## 2020-05-22 ENCOUNTER — Ambulatory Visit: Payer: Medicare Other

## 2020-05-22 ENCOUNTER — Other Ambulatory Visit: Payer: Self-pay

## 2020-05-22 ENCOUNTER — Ambulatory Visit (INDEPENDENT_AMBULATORY_CARE_PROVIDER_SITE_OTHER): Payer: Self-pay | Admitting: Vascular Surgery

## 2020-05-22 VITALS — BP 127/79 | HR 71 | Temp 98.1°F | Resp 20 | Ht 62.0 in | Wt 159.0 lb

## 2020-05-22 DIAGNOSIS — Z48812 Encounter for surgical aftercare following surgery on the circulatory system: Secondary | ICD-10-CM

## 2020-05-22 DIAGNOSIS — I739 Peripheral vascular disease, unspecified: Secondary | ICD-10-CM

## 2020-05-22 NOTE — Progress Notes (Signed)
Patient name: Sharon Mcdaniel MRN: [id number] DOB: [birth year] Sex: female  REASON FOR VISIT:   Follow-up after right femoropopliteal bypass  HPI:   Sharon Mcdaniel is a pleasant 84 y.o. female who presented with a 2-day history of severe pain of her right leg.  She had an ABI of 30% with minimal flow to the foot.  CT angiogram showed superficial femoral artery occlusive disease with severe infrainguinal arterial occlusive disease.  On 04/28/2020 she underwent a right femoral to below-knee popliteal artery bypass with a vein graft.  She did well postoperatively.  She was discharged on postoperative day #3.  She comes in for routine follow-up visit.  She has no specific complaints.  Unfortunately she did lose her daughter-in-law to breast cancer.  She has been gradually resuming her normal activities.  Has had mild right leg swelling.  Current Outpatient Medications  Medication Sig Dispense Refill  . albuterol (ACCUNEB) 1.25 MG/3ML nebulizer solution TAKE 3 MLS VIA NEBULIZER DAILY (Patient taking differently: Take 3 mLs by nebulization daily. ) 90 mL 5  . aspirin 325 MG tablet Take 650 mg by mouth once.    Marland Kitchen atorvastatin (LIPITOR) 40 MG tablet Take 1 tablet (40 mg total) by mouth at bedtime. 90 tablet 3  . budesonide (PULMICORT) 0.25 MG/2ML nebulizer solution TAKE 2 MLS VIA NEBULIZER DAILY (Patient taking differently: Take 2 mLs by nebulization daily. ) 60 mL 0  . clonazePAM (KLONOPIN) 0.5 MG tablet Take 0.5 mg by mouth daily as needed for anxiety.     . diphenhydrAMINE (BENADRYL) 50 MG tablet Take 50 mg by mouth as needed for allergies (bee stings).     Marland Kitchen ibuprofen (ADVIL) 200 MG tablet Take 400 mg by mouth every 6 (six) hours as needed for moderate pain.    Marland Kitchen ipratropium (ATROVENT) 0.02 % nebulizer solution TAKE 2.5 MLS VIA NEBULIZER DAILY (Patient taking differently: Take 2.5 mLs by nebulization daily. ) 150 mL 5  . levothyroxine (SYNTHROID, LEVOTHROID) 112 MCG tablet Take 112 mcg by mouth daily.      . mirtazapine (REMERON) 15 MG tablet Take 15 mg by mouth at bedtime.    Marland Kitchen oxyCODONE-acetaminophen (PERCOCET/ROXICET) 5-325 MG tablet Take 1 tablet by mouth every 6 (six) hours as needed for moderate pain. 15 tablet 0   No current facility-administered medications for this visit.    REVIEW OF SYSTEMS:  [X]  denotes positive finding, [ ]  denotes negative finding Vascular    Leg swelling    Cardiac    Chest pain or chest pressure:    Shortness of breath upon exertion:    Short of breath when lying flat:    Irregular heart rhythm:    Constitutional    Fever or chills:     PHYSICAL EXAM:   Vitals:   05/22/20 1524  BP: 127/79  Pulse: 71  Resp: 20  Temp: 98.1 F (36.7 C)  SpO2: 96%  Weight: 72.1 kg  Height: 5\' 2"  (1.575 m)    GENERAL: The patient is a well-nourished female, in no acute distress. The vital signs are documented above. CARDIOVASCULAR: There is a regular rate and rhythm. PULMONARY: There is good air exchange bilaterally without wheezing or rales. VASCULAR: Her incisions are healing nicely. She has brisk Doppler signals in the right foot.  DATA:   No new data  MEDICAL ISSUES:   STATUS POST RIGHT FEMOROPOPLITEAL BYPASS: Patient is doing well status post a right femoral to below-knee popliteal artery bypass with vein.  She has  no symptoms at this point.  I have ordered a graft duplex and ABIs in 3 months and I will see her back at that time.  She knows to call sooner if she has problems.  [name] [facility] [place] [phone]

## 2020-05-27 ENCOUNTER — Other Ambulatory Visit: Payer: Self-pay | Admitting: *Deleted

## 2020-05-27 DIAGNOSIS — I739 Peripheral vascular disease, unspecified: Secondary | ICD-10-CM

## 2020-05-29 ENCOUNTER — Other Ambulatory Visit: Payer: Self-pay | Admitting: Internal Medicine

## 2020-05-31 ENCOUNTER — Other Ambulatory Visit: Payer: Self-pay | Admitting: Internal Medicine

## 2020-06-07 ENCOUNTER — Ambulatory Visit: Payer: Medicare Other | Admitting: Internal Medicine

## 2020-06-12 ENCOUNTER — Ambulatory Visit: Payer: Medicare Other | Admitting: Podiatry

## 2020-06-12 ENCOUNTER — Other Ambulatory Visit: Payer: Self-pay

## 2020-06-12 ENCOUNTER — Encounter: Payer: Self-pay | Admitting: Podiatry

## 2020-06-12 VITALS — BP 156/79 | HR 70

## 2020-06-12 DIAGNOSIS — M2011 Hallux valgus (acquired), right foot: Secondary | ICD-10-CM | POA: Diagnosis not present

## 2020-06-12 DIAGNOSIS — M79674 Pain in right toe(s): Secondary | ICD-10-CM

## 2020-06-12 DIAGNOSIS — M79675 Pain in left toe(s): Secondary | ICD-10-CM | POA: Diagnosis not present

## 2020-06-12 DIAGNOSIS — M2012 Hallux valgus (acquired), left foot: Secondary | ICD-10-CM

## 2020-06-12 DIAGNOSIS — I739 Peripheral vascular disease, unspecified: Secondary | ICD-10-CM

## 2020-06-12 DIAGNOSIS — B351 Tinea unguium: Secondary | ICD-10-CM

## 2020-06-12 NOTE — Progress Notes (Signed)
Subjective: Sharon Mcdaniel presents today referred by Sharon Rasmussen, MD for complaint of painful thick toenails that are difficult to trim. Pain interferes with ambulation. Aggravating factors include wearing enclosed shoe gear. Pain is relieved with periodic professional debridement.   She has h/o PAD. Patient reported to Urgent Care and subsequently ED on 04/27/2020 with pallorous, painful right lower extremity. She was found to have an occluded SFA. She underwent right fem-pop bypass by Dr. Scot Dock on 04/28/2020.  Past Medical History:  Diagnosis Date  . COPD (chronic obstructive pulmonary disease) ([place])   . Peripheral vascular disease [facility])      Patient Active Problem List   Diagnosis Date Noted  . PVD (peripheral vascular disease) ([place]) 04/27/2020  . Chronic back pain 02/05/2018  . Bilateral impacted cerumen 01/11/2018  . Neck pain 01/11/2018  . Referred otalgia of left ear 01/11/2018  . Abnormal CT of the chest 06/11/2016  . Dyspnea 06/10/2016  . Cough 06/10/2016  . HYPOTHYROIDISM 09/05/2007  . ASTHMATIC BRONCHITIS, ACUTE 09/05/2007  . EMPHYSEMA 09/05/2007  . Asthmatic bronchitis , chronic ([place]) 09/05/2007  . TEMPOROMANDIBULAR JOINT PAIN 09/05/2007     Past Surgical History:  Procedure Laterality Date  . ABDOMINAL HYSTERECTOMY  1980  . BACK SURGERY  07/2011   synovial cyst removal  . INTRAOPERATIVE ARTERIOGRAM Right 04/28/2020   Procedure: INTRA OPERATIVE ARTERIOGRAM;  Surgeon: Angelia Mould, MD;  Location: [place];  Service: Vascular;  Laterality: Right;  . THROMBECTOMY OF BYPASS GRAFT FEMORAL- POPLITEAL ARTERY Right 04/28/2020   Procedure: RIGHT FEMORAL-BELOW KNEE POPLITEAL BYPASS GRAFT USING NON-REVERSED GREATER SAPHENOUS VEIN GRAFT HARVESTED FROM RIGHT UPPER LEG. ;  Surgeon: Angelia Mould, MD;  Location: [place];  Service: Vascular;  Laterality: Right;  . TUBAL LIGATION  1970     Current Outpatient Medications on File Prior to Visit  Medication Sig  Dispense Refill  . albuterol (ACCUNEB) 1.25 MG/3ML nebulizer solution TAKE 3 MLS VIA NEBULIZER DAILY (Patient taking differently: Take 3 mLs by nebulization daily. ) 90 mL 5  . aspirin 325 MG tablet Take 650 mg by mouth once.    Marland Kitchen atorvastatin (LIPITOR) 40 MG tablet Take 1 tablet (40 mg total) by mouth at bedtime. 90 tablet 3  . budesonide (PULMICORT) 0.25 MG/2ML nebulizer solution TAKE 2 MLS VIA NEBULIZER DAILY 60 mL 0  . clonazePAM (KLONOPIN) 0.5 MG tablet Take 0.5 mg by mouth daily as needed for anxiety.     . diphenhydrAMINE (BENADRYL) 50 MG tablet Take 50 mg by mouth as needed for allergies (bee stings).     Marland Kitchen ibuprofen (ADVIL) 200 MG tablet Take 400 mg by mouth every 6 (six) hours as needed for moderate pain.    Marland Kitchen ipratropium (ATROVENT) 0.02 % nebulizer solution TAKE 2.5 MLS VIA NEBULIZER DAILY (Patient taking differently: Take 2.5 mLs by nebulization daily. ) 150 mL 5  . levothyroxine (SYNTHROID, LEVOTHROID) 112 MCG tablet Take 112 mcg by mouth daily.     . mirtazapine (REMERON) 15 MG tablet Take 15 mg by mouth at bedtime.    Marland Kitchen oxyCODONE-acetaminophen (PERCOCET/ROXICET) 5-325 MG tablet Take 1 tablet by mouth every 6 (six) hours as needed for moderate pain. 15 tablet 0   No current facility-administered medications on file prior to visit.     Allergies  Allergen Reactions  . Fluconazole In Dextrose Hives and Shortness Of Breath  . Gatifloxacin Hives and Shortness Of Breath  . Iodinated Diagnostic Agents Hives and Shortness Of Breath    States she  tolerates betadine  . Ioxaglate Hives and Shortness Of Breath  . Quinolones Hives and Shortness Of Breath  . Tequin Hives and Shortness Of Breath  . Zithromax [Azithromycin]     GI Upset  . Codeine Other (See Comments)    Makes her jittery      Social History   Occupational History  . Occupation: retired  Tobacco Use  . Smoking status: Former Smoker    Packs/day: 1.00    Years: 30.00    Pack years: 30.00    Quit date: 11/03/2003     Years since quitting: 16.6  . Smokeless tobacco: Never Used  Vaping Use  . Vaping Use: Never used  Substance and Sexual Activity  . Alcohol use: No  . Drug use: No  . Sexual activity: Never     Family History  Problem Relation Age of Onset  . Lung cancer Mother   . Rheum arthritis Mother   . Asthma Father   . Heart disease Father      Immunization History  Administered Date(s) Administered  . Influenza Whole 08/24/2006, 07/31/2016  . Influenza, High Dose Seasonal PF 08/03/2015  . Influenza-Unspecified 07/03/2014  . Pneumococcal Polysaccharide-23 09/02/2004     Objective: Sharon Mcdaniel is a pleasant 84 y.o.Caucasian female WD, WN in NAD.Marland Kitchen AAO x 3.  Vitals:   06/12/20 0852  BP: (!) 156/79  Pulse: 70    Vascular Examination:  Capillary refill time to digits immediate b/l. Nonpalpable DP pulse(s) b/l lower extremities. Nonpalpable PT pulse(s) b/l lower extremities. Pedal hair absent. Lower extremity skin temperature gradient within normal limits. No pain with calf compression b/l. Trace edema noted right lower extremity.  Dermatological Examination: Pedal skin is thin shiny, atrophic b/l lower extremities. No open wounds bilaterally. No interdigital macerations bilaterally. Toenails 1-5 b/l elongated, discolored, dystrophic, thickened, crumbly with subungual debris and tenderness to dorsal palpation.  Musculoskeletal: Normal muscle strength 5/5 to all lower extremity muscle groups bilaterally. No pain crepitus or joint limitation noted with ROM b/l. Hallux valgus with bunion deformity noted b/l lower extremities.  Neurological: Protective sensation intact 5/5 intact bilaterally with 10g monofilament b/l. Vibratory sensation diminished b/l. Proprioception intact bilaterally. Clonus negative b/l.  Assessment: 1. Pain due to onychomycosis of toenails of both feet   2. Hallux valgus, acquired, bilateral   3. PAD (peripheral artery disease) ([facility])     Plan: -Examined  patient. -Toenails 1-5 b/l were debrided in length and girth with sterile nail nippers and dremel without iatrogenic bleeding.  -Patient to report any pedal injuries to medical professional immediately. -Patient to continue soft, supportive shoe gear daily. -Patient/POA to call should there be question/concern in the interim.  Return in about 3 months (around 09/12/2020) for nail trim.

## 2020-06-12 NOTE — Patient Instructions (Addendum)
Peripheral Vascular Disease Peripheral vascular disease (PVD) is a disease of the blood vessels. A simple term for PVD is poor circulation. In most cases, PVD narrows the blood vessels that carry blood from your heart to the rest of your body. This can result in a decreased supply of blood to your arms, legs, and internal organs, like your stomach or kidneys. However, it most often affects a person's lower legs and feet. There are two types of PVD.  Organic PVD. This is the more common type. It is caused by damage to the structure of blood vessels.  Functional PVD. This is caused by conditions that make blood vessels contract and tighten (spasm). Without treatment, PVD tends to get worse over time. PVD can also lead to acute limb ischemia. This is when an arm or leg suddenly has trouble getting enough blood. This is a medical emergency. What are the causes?  Each type of PVD has many different causes. The most common cause of PVD is buildup of a fatty material (plaque) inside your arteries (atherosclerosis). Small amounts of plaque can break off from the walls of the blood vessels and become lodged in a smaller artery. This blocks blood flow and can cause acute limb ischemia. Other common causes of PVD include:  Blood clots that form inside of blood vessels.  Injuries to blood vessels.  Diseases that cause inflammation of blood vessels or cause blood vessel spasms.  Health behaviors and health history that increase your risk of developing PVD. What increases the risk? You are more likely to develop this condition if:  You have a family history of PVD.  You have certain medical conditions, including: ? High cholesterol. ? Diabetes. ? High blood pressure (hypertension). ? Coronary heart disease. ? Past problems with blood clots. ? Past injury, such as burns or a broken bone. These may have damaged blood vessels in your limbs. ? Buerger disease. This is caused by inflamed blood vessels  in your hands and feet. ? Some forms of arthritis. ? Rare birth defects that affect the arteries in your legs. ? Kidney disease.  You use tobacco or smoke.  You do not get enough exercise.  You are obese.  You are age 50 or older. What are the signs or symptoms? This condition may cause different symptoms. Your symptoms depend on what part of your body is not getting enough blood. Some common signs and symptoms include:  Cramps in your lower legs. This may be a symptom of poor leg circulation (claudication).  Pain and weakness in your legs. This happens while you are physically active but goes away when you rest (intermittent claudication).  Leg pain when at rest.  Leg numbness, tingling, or weakness.  Coldness in a leg or foot, especially when compared with the other leg.  Skin or hair changes. These can include: ? Hair loss. ? Shiny skin. ? Pale or bluish skin. ? Thick toenails.  Inability to get or maintain an erection (erectile dysfunction).  Fatigue. People with PVD are more likely to develop ulcers and sores on their toes, feet, or legs. These may take longer than normal to heal. How is this diagnosed? This condition is diagnosed based on:  Your signs and symptoms.  A physical exam and your medical history.  Other tests to find out what is causing your PVD and to determine its severity. Tests may include: ? Blood pressure recordings from your arms and legs and measurements of the strength of your pulses (pulse volume   recordings). ? Imaging studies using sound waves to take pictures of the blood flow through your blood vessels (Doppler ultrasound). ? Injecting a dye into your blood vessels before having imaging studies using:  X-rays (angiogram or arteriogram).  Computer-generated X-rays (CT angiogram).  A powerful electromagnetic field and a computer (magnetic resonance angiogram or MRA). How is this treated? Treatment for PVD depends on the cause of your  condition and how severe your symptoms are. It also depends on your age. Underlying causes need to be treated and controlled. These include long-term (chronic) conditions, such as diabetes, high cholesterol, and high blood pressure. Treatment includes:  Lifestyle changes, such as: ? Quitting smoking. ? Exercising regularly. ? Following a low-fat, low-cholesterol diet.  Taking medicines, such as: ? Blood thinners to prevent blood clots. ? Medicines to improve blood flow. ? Medicines to improve your blood cholesterol levels.  Surgical procedures, such as: ? A procedure that uses an inflated balloon to open a blocked artery and improve blood flow (angioplasty). ? A procedure to put in a wire mesh tube to keep a blocked artery open (stent implant). ? Surgery to reroute blood flow around a blocked artery (peripheral bypass surgery). ? Surgery to remove dead tissue from an infected wound on the affected limb. ? Amputation. This is surgical removal of the affected limb. It may be necessary in cases of acute limb ischemia where there has been no improvement through medical or surgical treatments. Follow these instructions at home: Lifestyle  Do not use any products that contain nicotine or tobacco, such as cigarettes and e-cigarettes. If you need help quitting, ask your health care provider.  Lose weight if you are overweight, and maintain a healthy weight as discussed by your health care provider.  Eat a diet that is low in fat and cholesterol. If you need help, ask your health care provider.  Exercise regularly. Ask your health care provider to suggest some good activities for you. General instructions  Take over-the-counter and prescription medicines only as told by your health care provider.  Take good care of your feet: ? Wear comfortable shoes that fit well. ? Check your feet often for any cuts or sores.  Keep all follow-up visits as told by your health care provider. This is  important. Contact a health care provider if:  You have cramps in your legs while walking.  You have leg pain when you are at rest.  You have coldness in a leg or foot.  Your skin changes.  You have erectile dysfunction.  You have cuts or sores on your feet that are not healing. Get help right away if:  Your arm or leg turns cold, numb, and blue.  Your arms or legs become red, warm, swollen, painful, or numb.  You have chest pain or trouble breathing.  You suddenly have weakness in your face, arm, or leg.  You become very confused or lose the ability to speak.  You suddenly have a very bad headache or lose your vision. Summary  Peripheral vascular disease (PVD) is a disease of the blood vessels.  In most cases, PVD narrows the blood vessels that carry blood from your heart to the rest of your body.  PVD may cause different symptoms. Your symptoms depend on what part of your body is not getting enough blood.  Treatment for PVD depends on the cause of your condition and how severe your symptoms are. This information is not intended to replace advice given to you by your   health care provider. Make sure you discuss any questions you have with your health care provider. Document Revised: 10/01/2017 Document Reviewed: 11/26/2016 Elsevier Patient Education  [place]  A bunion is a bump on the base of the big toe that forms when the bones of the big toe joint move out of position. Bunions may be small at first, but they often get larger over time. They can make walking painful. What are the causes? A bunion may be caused by:  Wearing narrow or pointed shoes that force the big toe to press against the other toes.  Abnormal foot development that causes the foot to roll inward (pronate).  Changes in the foot that are caused by certain diseases, such as rheumatoid arthritis or polio.  A foot injury. What increases the risk? The following factors may make  you more likely to develop this condition:  Wearing shoes that squeeze the toes together.  Having certain diseases, such as: ? Rheumatoid arthritis. ? Polio. ? Cerebral palsy.  Having family members who have bunions.  Being born with a foot deformity, such as flat feet or low arches.  Doing activities that put a lot of pressure on the feet, such as ballet dancing. What are the signs or symptoms? The main symptom of a bunion is a noticeable bump on the big toe. Other symptoms may include:  Pain.  Swelling around the big toe.  Redness and inflammation.  Thick or hardened skin on the big toe or between the toes.  Stiffness or loss of motion in the big toe.  Trouble with walking. How is this diagnosed? A bunion may be diagnosed based on your symptoms, medical history, and activities. You may have tests, such as:  X-rays. These allow your health care provider to check the position of the bones in your foot and look for damage to your joint. They also help your health care provider determine the severity of your bunion and the best way to treat it.  Joint aspiration. In this test, a sample of fluid is removed from the toe joint. This test may be done if you are in a lot of pain. It helps rule out diseases that cause painful swelling of the joints, such as arthritis. How is this treated? Treatment depends on the severity of your symptoms. The goal of treatment is to relieve symptoms and prevent the bunion from getting worse. Your health care provider may recommend:  Wearing shoes that have a wide toe box.  Using bunion pads to cushion the affected area.  Taping your toes together to keep them in a normal position.  Placing a device inside your shoe (orthotics) to help reduce pressure on your toe joint.  Taking medicine to ease pain, inflammation, and swelling.  Applying heat or ice to the affected area.  Doing stretching exercises.  Surgery to remove scar tissue and move  the toes back into their normal position. This treatment is rare. Follow these instructions at home: Managing pain, stiffness, and swelling   If directed, put ice on the painful area: ? Put ice in a plastic bag. ? Place a towel between your skin and the bag. ? Leave the ice on for 20 minutes, 2-3 times a day. Activity   If directed, apply heat to the affected area before you exercise. Use the heat source that your health care provider recommends, such as a moist heat pack or a heating pad. ? Place a towel between your skin and the  heat source. ? Leave the heat on for 20-30 minutes. ? Remove the heat if your skin turns bright red. This is especially important if you are unable to feel pain, heat, or cold. You may have a greater risk of getting burned.  Do exercises as told by your health care provider. General instructions  Support your toe joint with proper footwear, shoe padding, or taping as told by your health care provider.  Take over-the-counter and prescription medicines only as told by your health care provider.  Keep all follow-up visits as told by your health care provider. This is important. Contact a health care provider if your symptoms:  Get worse.  Do not improve in 2 weeks. Get help right away if you have:  Severe pain and trouble with walking. Summary  A bunion is a bump on the base of the big toe that forms when the bones of the big toe joint move out of position.  Bunions can make walking painful.  Treatment depends on the severity of your symptoms.  Support your toe joint with proper footwear, shoe padding, or taping as told by your health care provider. This information is not intended to replace advice given to you by your health care provider. Make sure you discuss any questions you have with your health care provider. Document Revised: 04/25/2018 Document Reviewed: 03/01/2018 Elsevier Patient Education  [place].  Onychomycosis/Fungal  Toenails  WHAT IS IT? An infection that lies within the keratin of your nail plate that is caused by a fungus.  WHY ME? Fungal infections affect all ages, sexes, races, and creeds.  There may be many factors that predispose you to a fungal infection such as age, coexisting medical conditions such as diabetes, or an autoimmune disease; stress, medications, fatigue, genetics, etc.  Bottom line: fungus thrives in a warm, moist environment and your shoes offer such a location.  IS IT CONTAGIOUS? Theoretically, yes.  You do not want to share shoes, nail clippers or files with someone who has fungal toenails.  Walking around barefoot in the same room or sleeping in the same bed is unlikely to transfer the organism.  It is important to realize, however, that fungus can spread easily from one nail to the next on the same foot.  HOW DO WE TREAT THIS?  There are several ways to treat this condition.  Treatment may depend on many factors such as age, medications, pregnancy, liver and kidney conditions, etc.  It is best to ask your doctor which options are available to you.  7. No treatment.   Unlike many other medical concerns, you can live with this condition.  However for many people this can be a painful condition and may lead to ingrown toenails or a bacterial infection.  It is recommended that you keep the nails cut short to help reduce the amount of fungal nail. 8. Topical treatment.  These range from herbal remedies to prescription strength nail lacquers.  About 40-50% effective, topicals require twice daily application for approximately 9 to 12 months or until an entirely new nail has grown out.  The most effective topicals are medical grade medications available through physicians offices. 9. Oral antifungal medications.  With an 80-90% cure rate, the most common oral medication requires 3 to 4 months of therapy and stays in your system for a year as the new nail grows out.  Oral antifungal medications do  require blood work to make sure it is a safe drug for you.  A liver function panel will be performed prior to starting the medication and after the first month of treatment.  It is important to have the blood work performed to avoid any harmful side effects.  In general, this medication safe but blood work is required. 10. Laser Therapy.  This treatment is performed by applying a specialized laser to the affected nail plate.  This therapy is noninvasive, fast, and non-painful.  It is not covered by insurance and is therefore, out of pocket.  The results have been very good with a 80-95% cure rate.  The [facility] is the only practice in the area to offer this therapy. 11. Permanent Nail Avulsion.  Removing the entire nail so that a new nail will not grow back.

## 2020-06-20 ENCOUNTER — Ambulatory Visit
Admission: RE | Admit: 2020-06-20 | Discharge: 2020-06-20 | Disposition: A | Discharge status: Home / Self Care | Payer: Medicare Other | Source: Ambulatory Visit | Attending: Family Medicine | Admitting: Family Medicine

## 2020-06-20 ENCOUNTER — Other Ambulatory Visit: Payer: Self-pay

## 2020-06-20 DIAGNOSIS — Z1231 Encounter for screening mammogram for malignant neoplasm of breast: Secondary | ICD-10-CM

## 2020-06-27 ENCOUNTER — Other Ambulatory Visit: Payer: Self-pay | Admitting: Internal Medicine

## 2020-07-01 ENCOUNTER — Encounter: Payer: Self-pay | Admitting: Internal Medicine

## 2020-07-01 ENCOUNTER — Other Ambulatory Visit: Payer: Self-pay

## 2020-07-01 ENCOUNTER — Ambulatory Visit: Payer: Medicare Other | Admitting: Internal Medicine

## 2020-07-01 DIAGNOSIS — J449 Chronic obstructive pulmonary disease, unspecified: Secondary | ICD-10-CM

## 2020-07-01 NOTE — Patient Instructions (Signed)
No change in plan    Please schedule a follow up visit in 12  months but call sooner if needed

## 2020-07-01 NOTE — Assessment & Plan Note (Addendum)
Quit smoking 2005 PFTs July 06, 2006   FEV1 of 58% predicted with a ratio of 56%  And 15% improvement after bronchodilator consistent with an asthmatic component. Allergy profile 06/10/2016 >  Eos 0.1/  IgE 7 RAST neg  - Sinus CT 06/15/2016 > neg    - PFT's  07/08/2016  FEV1 1.45 (79 % ) ratio 74  p 10 % improvement from saba p duoneb > 6 h  prior to study with DLCO  52 % corrects to 71  % for alv volume     All goals of chronic asthma control met including optimal function and elimination of symptoms with minimal need for rescue therapy.  Contingencies discussed in full including contacting this office immediately if not controlling the symptoms using the rule of two's.            Each maintenance medication was reviewed in detail including emphasizing most importantly the difference between maintenance and prns and under what circumstances the prns are to be triggered using an action plan format where appropriate.  Total time for H and P, chart review, counseling, teaching device and generating customized AVS unique to this office visit / charting =102m

## 2020-07-01 NOTE — Progress Notes (Signed)
Subjective:     Patient ID: Sharon Mcdaniel, female   DOB: 05-08-1936      MRN: [id number]   Brief patient profile:   14 yowf quit smoking in 2005 with severe CAP dx as GOLD II copd Sept 2007 and referred to pulmonary clinic 06/10/2016 by Dr Kim/ Thedora Hinders with nl pfts 07/2016 p neb c/w AB not copd   History of Present Illness  06/10/2016 1st [facility] office visit/ Graeme Menees   Chief Complaint  Patient presents with  . Pulmonary Consult    Pt referred by Dr Epimenio Sarin for COPD/lung mass. Recent PET scan 05/11/16.   onset of new pattern in Dec 2016 with severe cough that comes and goes and a persistent sense of doe that persisteed since them not back to baseline esp Worse in  June 2017 but improved p abx Doe = MMRC1 = can walk nl pace, flat grade, can't hurry or go uphills or steps s sob Since onset of recurrent cough has had unresolved nasal congestion and intermittent purulent nasal drainage correlating with flares of "bronchitis' pred / abx def help cough but keeps coming back sev weeks later  rec  schedule sinus CT> neg 06/15/16  GERD  Diet     03/11/2018  f/u ov/Sharon Mcdaniel re:   AB on pulmocort/duoneb each am only Chief Complaint  Patient presents with  . Follow-up    Breathing is doing well and not coughing. She uses albuterol, atrovent and budesonide each once per day. No new co's.   Dyspnea:  Housework, steps ok/ dog walking some hills  Cough: no Sleep: well SABA use:  No extra rx rec No change rx   05/10/2019  f/u ov/Sharon Mcdaniel re:  AB pulm/duobeb once a twice dialy  Chief Complaint  Patient presents with  . Follow-up    Breathing is doing well and no new co's. She is using using her neb at least once per day.   Dyspnea:  yardwork ok, MMRC1 = can walk nl pace, flat grade, can't hurry or go uphills or steps s sob   Cough: none Sleeping: able to lie flat one pillow SABA use: as abov 02: none rec Once daily is fine for neb with pm dose prn   07/01/2020  Yearly f/u ov/Sharon Mcdaniel re:   AB on duoneb/pulmicort 0.25 mg each am only  Chief Complaint  Patient presents with  . Follow-up    Breathing is doing well. No complaints.   Dyspnea:  More limited by R leg/ graft for PVD Cough: cough Sleeping: one pillow/ bed flat  SABA use: no extra 02: no 02    No obvious day to day or daytime variability or assoc excess/ purulent sputum or mucus plugs or hemoptysis or cp or chest tightness, subjective wheeze or overt sinus or hb symptoms.   Sleeping  without nocturnal  or early am exacerbation  of respiratory  c/o's or need for noct saba. Also denies any obvious fluctuation of symptoms with weather or environmental changes or other aggravating or alleviating factors except as outlined above   No unusual exposure hx or h/o childhood pna/ asthma or knowledge of premature birth.  Current Allergies, Complete Past Medical History, Past Surgical History, Family History, and Social History were reviewed in [employer] record.  ROS  The following are not active complaints unless bolded Hoarseness, sore throat, dysphagia, dental problems, itching, sneezing,  nasal congestion or discharge of excess mucus or purulent secretions, ear ache,   fever, chills,  sweats, unintended wt loss or wt gain, classically pleuritic or exertional cp,  orthopnea pnd or arm/hand swelling  or leg swelling, presyncope, palpitations, abdominal pain, anorexia, nausea, vomiting, diarrhea  or change in bowel habits or change in bladder habits, change in stools or change in urine, dysuria, hematuria,  rash, arthralgias, visual complaints, headache, numbness, weakness or ataxia or problems with walking post op RLE or coordination,  change in mood or  memory.        Current Meds  Medication Sig  . albuterol (ACCUNEB) 1.25 MG/3ML nebulizer solution Take 3 mLs by nebulization daily.  Marland Kitchen aspirin 325 MG tablet Take 650 mg by mouth once.  Marland Kitchen atorvastatin (LIPITOR) 40 MG tablet Take 1 tablet (40 mg total) by  mouth at bedtime.  . budesonide (PULMICORT) 0.25 MG/2ML nebulizer solution TAKE 2 MLS VIA NEBULIZER DAILY  . clonazePAM (KLONOPIN) 0.5 MG tablet Take 0.5 mg by mouth daily as needed for anxiety.   . diphenhydrAMINE (BENADRYL) 50 MG tablet Take 50 mg by mouth as needed for allergies (bee stings).   Marland Kitchen ibuprofen (ADVIL) 200 MG tablet Take 400 mg by mouth every 6 (six) hours as needed for moderate pain.  Marland Kitchen ipratropium (ATROVENT) 0.02 % nebulizer solution TAKE 2.5 MLS VIA NEBULIZER DAILY (Patient taking differently: Take 2.5 mLs by nebulization daily. )  . levothyroxine (SYNTHROID, LEVOTHROID) 112 MCG tablet Take 112 mcg by mouth daily.   . mirtazapine (REMERON) 15 MG tablet Take 15 mg by mouth at bedtime.  Marland Kitchen oxyCODONE-acetaminophen (PERCOCET/ROXICET) 5-325 MG tablet Take 1 tablet by mouth every 6 (six) hours as needed for moderate pain.                          Objective:   Physical Exam   Pleasant amb wf nad  07/01/2020        161 05/10/2019          149  03/11/2018        139  03/10/2017          152   08/14/2016     157   07/08/16 161 lb 12.8 oz (73.4 kg)  06/10/16 164 lb (74.4 kg)  11/03/11 145 lb 8 oz (66 kg)     Vital signs reviewed  07/01/2020  - Note at rest 02 sats  93% on RA  Report: full dentures   HEENT : pt wearing mask not removed for exam due to covid -19 concerns.    NECK :  without JVD/Nodes/TM/ nl carotid upstrokes bilaterally   LUNGS: no acc muscle use,  Nl contour chest which is clear to A and P bilaterally without cough on insp or exp maneuvers   CV:  RRR  no s3 or murmur or increase in P2, and no edema   ABD:  soft and nontender with nl inspiratory excursion in the supine position. No bruits or organomegaly appreciated, bowel sounds nl  MS:  Nl gait/ ext warm without deformities, calf tenderness, cyanosis or clubbing No obvious joint restrictions   SKIN: warm and dry without lesions    NEURO:  alert, approp, nl sensorium with  no motor or  cerebellar deficits apparent.                     Assessment:

## 2020-07-02 ENCOUNTER — Encounter: Payer: Self-pay | Admitting: Internal Medicine

## 2020-07-22 ENCOUNTER — Telehealth: Payer: Self-pay | Admitting: *Deleted

## 2020-07-22 NOTE — Telephone Encounter (Signed)
Patient called c/o left leg pain and cramping, stating this is how her right leg started with symptoms.  Patient was scheduled 10/27/2021for OV along with an  ABI and right BPG duplex.She was inquiring if this could be moved to a sooner date.  An appointment was made for 07/31/2020 at 9:00.  Patient voiced understanding of these instructions.

## 2020-07-26 ENCOUNTER — Other Ambulatory Visit: Payer: Self-pay | Admitting: Internal Medicine

## 2020-07-31 ENCOUNTER — Ambulatory Visit (INDEPENDENT_AMBULATORY_CARE_PROVIDER_SITE_OTHER)
Admission: RE | Admit: 2020-07-31 | Discharge: 2020-07-31 | Disposition: A | Discharge status: Home / Self Care | Payer: Medicare Other | Source: Ambulatory Visit | Attending: Vascular Surgery | Admitting: Vascular Surgery

## 2020-07-31 ENCOUNTER — Encounter: Payer: Self-pay | Admitting: Vascular Surgery

## 2020-07-31 ENCOUNTER — Ambulatory Visit (HOSPITAL_COMMUNITY)
Admission: RE | Admit: 2020-07-31 | Discharge: 2020-07-31 | Disposition: A | Discharge status: Home / Self Care | Payer: Medicare Other | Source: Ambulatory Visit | Attending: Vascular Surgery | Admitting: Vascular Surgery

## 2020-07-31 ENCOUNTER — Other Ambulatory Visit: Payer: Self-pay

## 2020-07-31 ENCOUNTER — Ambulatory Visit (INDEPENDENT_AMBULATORY_CARE_PROVIDER_SITE_OTHER): Payer: Medicare Other | Admitting: Vascular Surgery

## 2020-07-31 VITALS — BP 149/79 | HR 58 | Temp 98.0°F | Resp 20 | Ht 63.0 in | Wt 161.0 lb

## 2020-07-31 DIAGNOSIS — Z48812 Encounter for surgical aftercare following surgery on the circulatory system: Secondary | ICD-10-CM

## 2020-07-31 DIAGNOSIS — I739 Peripheral vascular disease, unspecified: Secondary | ICD-10-CM

## 2020-07-31 NOTE — Progress Notes (Signed)
REASON FOR VISIT:   Follow-up after right femoropopliteal bypass  MEDICAL ISSUES:   PERIPHERAL VASCULAR DISEASE: The patient is doing well status post right femoropopliteal bypass.  The graft is patent and she is asymptomatic.  She has no significant leg swelling.  I have encouraged her to stay as active as possible.  She is not a smoker.  She is on aspirin and is on a statin.  We also discussed importance of nutrition.  She has no significant symptoms on the left.  Her ABI on the left is stable.  I ordered follow-up ABIs and a graft duplex in 6 months and I will see her back at that time.  She knows to call sooner if she has problems.   HPI:   Sharon Mcdaniel is a pleasant 84 y.o. female who underwent a right femoropopliteal bypass graft on 04/28/2020.  She had presented with severe pain in the right leg with an ABI of 30%.  She had a CT angiogram which showed occlusion of the superficial femoral artery and severe infrainguinal arterial occlusive disease.  She underwent a right femoral to below-knee popliteal artery bypass with vein and intraoperative arteriogram on 04/28/2020.  She has severe tibial disease distally.  The anterior tibial was occluded and the peroneal and posterior tibial arteries are small.  She comes in for routine follow-up visit.  She denies any claudication, rest pain, or nonhealing ulcers.  She is not a smoker.  She quit in 2005.  She is on aspirin and is on a statin.  Past Medical History:  Diagnosis Date  . COPD (chronic obstructive pulmonary disease) ([place])   . Peripheral vascular disease ([place])     Family History  Problem Relation Age of Onset  . Lung cancer Mother   . Rheum arthritis Mother   . Asthma Father   . Heart disease Father     SOCIAL HISTORY: Social History   Tobacco Use  . Smoking status: Former Smoker    Packs/day: 1.00    Years: 30.00    Pack years: 30.00    Quit date: 11/03/2003    Years since quitting: 16.7  . Smokeless tobacco: Never  Used  Substance Use Topics  . Alcohol use: No    Allergies  Allergen Reactions  . Betadine [Povidone Iodine] Rash  . Fluconazole In Dextrose Hives and Shortness Of Breath  . Gatifloxacin Hives and Shortness Of Breath  . Iodinated Diagnostic Agents Hives and Shortness Of Breath    States she tolerates betadine  . Ioxaglate Hives and Shortness Of Breath  . Quinolones Hives and Shortness Of Breath  . Tequin Hives and Shortness Of Breath  . Zithromax [Azithromycin]     GI Upset  . Codeine Other (See Comments)    Makes her jittery     Current Outpatient Medications  Medication Sig Dispense Refill  . albuterol (ACCUNEB) 1.25 MG/3ML nebulizer solution TAKE 3 MLS VIA NEBULIZER DAILY 90 mL 5  . aspirin 325 MG tablet Take 650 mg by mouth once.    Marland Kitchen atorvastatin (LIPITOR) 40 MG tablet Take 1 tablet (40 mg total) by mouth at bedtime. 90 tablet 3  . budesonide (PULMICORT) 0.25 MG/2ML nebulizer solution TAKE 2 MLS VIA NEBULIZER DAILY 60 mL 5  . clonazePAM (KLONOPIN) 0.5 MG tablet Take 0.5 mg by mouth daily as needed for anxiety.     . diphenhydrAMINE (BENADRYL) 50 MG tablet Take 50 mg by mouth as needed for allergies (bee stings).     Marland Kitchen  ibuprofen (ADVIL) 200 MG tablet Take 400 mg by mouth every 6 (six) hours as needed for moderate pain.    Marland Kitchen ipratropium (ATROVENT) 0.02 % nebulizer solution TAKE 2.5 MLS VIA NEBULIZER DAILY (Patient taking differently: Take 2.5 mLs by nebulization daily. ) 150 mL 5  . levothyroxine (SYNTHROID, LEVOTHROID) 112 MCG tablet Take 112 mcg by mouth daily.     . mirtazapine (REMERON) 15 MG tablet Take 15 mg by mouth at bedtime.     No current facility-administered medications for this visit.    REVIEW OF SYSTEMS:  [X]  denotes positive finding, [ ]  denotes negative finding Cardiac  Comments:  Chest pain or chest pressure:    Shortness of breath upon exertion:    Short of breath when lying flat:    Irregular heart rhythm:        Vascular    Pain in calf, thigh,  or hip brought on by ambulation:    Pain in feet at night that wakes you up from your sleep:     Blood clot in your veins:    Leg swelling:         Pulmonary    Oxygen at home:    Productive cough:     Wheezing:         Neurologic    Sudden weakness in arms or legs:     Sudden numbness in arms or legs:     Sudden onset of difficulty speaking or slurred speech:    Temporary loss of vision in one eye:     Problems with dizziness:         Gastrointestinal    Blood in stool:     Vomited blood:         Genitourinary    Burning when urinating:     Blood in urine:        Psychiatric    Major depression:         Hematologic    Bleeding problems:    Problems with blood clotting too easily:        Skin    Rashes or ulcers:        Constitutional    Fever or chills:     PHYSICAL EXAM:   Vitals:   07/31/20 0931  BP: (!) 149/79  Pulse: (!) 58  Resp: 20  Temp: 98 F (36.7 C)  SpO2: 97%  Weight: 161 lb (73 kg)  Height: 5\' 3"  (1.6 m)    GENERAL: The patient is a well-nourished female, in no acute distress. The vital signs are documented above. CARDIAC: There is a regular rate and rhythm.  VASCULAR: I do not detect carotid bruits. She has palpable femoral pulses bilaterally.  I cannot palpate pedal pulses.  She has no significant lower extremity swelling. PULMONARY: There is good air exchange bilaterally without wheezing or rales. ABDOMEN: Soft and non-tender with normal pitched bowel sounds.  MUSCULOSKELETAL: There are no major deformities or cyanosis. NEUROLOGIC: No focal weakness or paresthesias are detected. SKIN: There are no ulcers or rashes noted. PSYCHIATRIC: The patient has a normal affect.  DATA:    ARTERIAL DOPPLER STUDY: I have independently interpreted her arterial Doppler study today.  On the right side there is a biphasic posterior tibial signal with a monophasic dorsalis pedis signal.  ABI is 74%.  Toe pressures 52 mmHg.  On the left side there is a  triphasic posterior tibial signal with a monophasic dorsalis pedis signal.  ABI is 83%.  Toe  pressures 58 mmHg.  DUPLEX RIGHT LOWER EXTREMITY BYPASS: I have independently interpreted the duplex of the right lower extremity bypass graft.  The bypass graft is widely patent with biphasic flow throughout.  There are no areas of stenosis noted within the graft.  [name] [facility] [phone]

## 2020-08-05 ENCOUNTER — Encounter (HOSPITAL_COMMUNITY): Payer: Medicare Other

## 2020-08-06 ENCOUNTER — Other Ambulatory Visit: Payer: Self-pay | Admitting: *Deleted

## 2020-08-06 DIAGNOSIS — I739 Peripheral vascular disease, unspecified: Secondary | ICD-10-CM

## 2020-08-28 ENCOUNTER — Other Ambulatory Visit (HOSPITAL_COMMUNITY): Payer: Medicare Other

## 2020-08-28 ENCOUNTER — Ambulatory Visit: Payer: Medicare Other | Admitting: Vascular Surgery

## 2020-08-28 ENCOUNTER — Encounter (HOSPITAL_COMMUNITY): Payer: Medicare Other

## 2020-09-18 ENCOUNTER — Encounter: Payer: Self-pay | Admitting: Podiatry

## 2020-09-18 ENCOUNTER — Telehealth: Payer: Self-pay

## 2020-09-18 ENCOUNTER — Ambulatory Visit: Payer: Medicare Other | Admitting: Podiatry

## 2020-09-18 ENCOUNTER — Other Ambulatory Visit: Payer: Self-pay

## 2020-09-18 DIAGNOSIS — M79675 Pain in left toe(s): Secondary | ICD-10-CM

## 2020-09-18 DIAGNOSIS — M2011 Hallux valgus (acquired), right foot: Secondary | ICD-10-CM

## 2020-09-18 DIAGNOSIS — I739 Peripheral vascular disease, unspecified: Secondary | ICD-10-CM

## 2020-09-18 DIAGNOSIS — M79674 Pain in right toe(s): Secondary | ICD-10-CM

## 2020-09-18 DIAGNOSIS — M2012 Hallux valgus (acquired), left foot: Secondary | ICD-10-CM

## 2020-09-18 DIAGNOSIS — B351 Tinea unguium: Secondary | ICD-10-CM

## 2020-09-18 NOTE — Telephone Encounter (Signed)
Patient presented as a walk in to triage. Leaving for vacation next week and has concerns about L leg. Leg pain on walking and leg pain wakes her up at night. On exam, left foot slightly cooler than right foot, no color changes. PT and DP found by doppler. Scheduled for an ABI and LLE arterial duplex.

## 2020-09-20 ENCOUNTER — Ambulatory Visit (INDEPENDENT_AMBULATORY_CARE_PROVIDER_SITE_OTHER)
Admission: RE | Admit: 2020-09-20 | Discharge: 2020-09-20 | Disposition: A | Discharge status: Home / Self Care | Payer: Medicare Other | Source: Ambulatory Visit | Attending: Physician Assistant | Admitting: Physician Assistant

## 2020-09-20 ENCOUNTER — Other Ambulatory Visit: Payer: Self-pay

## 2020-09-20 ENCOUNTER — Ambulatory Visit (HOSPITAL_COMMUNITY)
Admission: RE | Admit: 2020-09-20 | Discharge: 2020-09-20 | Disposition: A | Discharge status: Home / Self Care | Payer: Medicare Other | Source: Ambulatory Visit | Attending: Physician Assistant | Admitting: Physician Assistant

## 2020-09-20 ENCOUNTER — Ambulatory Visit: Payer: Medicare Other | Admitting: Physician Assistant

## 2020-09-20 VITALS — BP 126/64 | HR 58 | Temp 98.7°F | Resp 20 | Ht 63.0 in | Wt 163.6 lb

## 2020-09-20 DIAGNOSIS — I739 Peripheral vascular disease, unspecified: Secondary | ICD-10-CM

## 2020-09-20 NOTE — Progress Notes (Signed)
Office Note     CC:  follow up Requesting Provider:  Hayden Rasmussen, MD  HPI: Sharon Mcdaniel is a 84 y.o. (19-Aug-1936) female with peripheral artery disease who underwent right femoropopliteal bypass graft on 04/28/20 by Dr. Scot Dock. She had presented with severe rest pain in the right leg with ABI of 0.30. She has been doing great since her surgery and her symptoms have been resolved at follow up. She most recently was seen in September by Dr. Scot Dock and she was doing very well with patent RLE bypass graft. She was not having any claudication, rest pain or non healing wounds of either lower extremities. She was schedule to follow up in 6 months with repeat non invasive studies.  She presents today for earlier follow up than scheduled due to new pain in her left leg. She says that over the past two weeks she has been getting cramping in her left calf only at night. She says this is very similar to what she experienced on her right lower extremity. The left calf cramping is very severe and she has to get up and walk around and take a tablespoon of mustard to get relief. She says she does not get any tightness or cramping on ambulation. She does not have any pain in her foot or leg otherwise. She says that otherwise her leg feels good. She is compliant with her Aspirin and statin. She is more so concerned because she leaves to go out of town for the [place] and she is staying with her son in [place], [place] from Thanksgiving until after the 1st of January. She says she just wanted to make sure everything is okay before she leaves town as she doesn't want anything emergent to happen while she is there.   The pt is on a statin for cholesterol management.  The pt is on a daily aspirin.   Other AC: none The pt is on ARB for hypertension.   The pt is not diabetic.   Tobacco hx: Former smoker, quit 2005  Past Medical History:  Diagnosis Date  . COPD (chronic obstructive pulmonary disease) ([place])   .  Peripheral vascular disease [facility])     Past Surgical History:  Procedure Laterality Date  . ABDOMINAL HYSTERECTOMY  1980  . BACK SURGERY  07/2011   synovial cyst removal  . INTRAOPERATIVE ARTERIOGRAM Right 04/28/2020   Procedure: INTRA OPERATIVE ARTERIOGRAM;  Surgeon: Angelia Mould, MD;  Location: [facility];  Service: Vascular;  Laterality: Right;  . THROMBECTOMY OF BYPASS GRAFT FEMORAL- POPLITEAL ARTERY Right 04/28/2020   Procedure: RIGHT FEMORAL-BELOW KNEE POPLITEAL BYPASS GRAFT USING NON-REVERSED GREATER SAPHENOUS VEIN GRAFT HARVESTED FROM RIGHT UPPER LEG. ;  Surgeon: Angelia Mould, MD;  Location: [facility];  Service: Vascular;  Laterality: Right;  . TUBAL LIGATION  1970    Social History   Socioeconomic History  . Marital status: Widowed    Spouse name: Not on file  . Number of children: Not on file  . Years of education: Not on file  . Highest education level: Not on file  Occupational History  . Occupation: retired  Tobacco Use  . Smoking status: Former Smoker    Packs/day: 1.00    Years: 30.00    Pack years: 30.00    Quit date: 11/03/2003    Years since quitting: 16.8  . Smokeless tobacco: Never Used  Vaping Use  . Vaping Use: Never used  Substance and Sexual Activity  . Alcohol use: No  .  Drug use: No  . Sexual activity: Never  Other Topics Concern  . Not on file  Social History Narrative  . Not on file   Social Determinants of Health   Financial Resource Strain:   . Difficulty of Paying Living Expenses: Not on file  Food Insecurity:   . Worried About Charity fundraiser in the Last Year: Not on file  . Ran Out of Food in the Last Year: Not on file  Transportation Needs:   . Lack of Transportation (Medical): Not on file  . Lack of Transportation (Non-Medical): Not on file  Physical Activity:   . Days of Exercise per Week: Not on file  . Minutes of Exercise per Session: Not on file  Stress:   . Feeling of Stress : Not on file  Social Connections:    . Frequency of Communication with Friends and Family: Not on file  . Frequency of Social Gatherings with Friends and Family: Not on file  . Attends Religious Services: Not on file  . Active Member of Clubs or Organizations: Not on file  . Attends Archivist Meetings: Not on file  . Marital Status: Not on file  Intimate Partner Violence:   . Fear of Current or Ex-Partner: Not on file  . Emotionally Abused: Not on file  . Physically Abused: Not on file  . Sexually Abused: Not on file    Family History  Problem Relation Age of Onset  . Lung cancer Mother   . Rheum arthritis Mother   . Asthma Father   . Heart disease Father     Current Outpatient Medications  Medication Sig Dispense Refill  . albuterol (ACCUNEB) 1.25 MG/3ML nebulizer solution TAKE 3 MLS VIA NEBULIZER DAILY 90 mL 5  . aspirin 325 MG tablet Take 650 mg by mouth once.    Marland Kitchen atorvastatin (LIPITOR) 40 MG tablet Take 1 tablet (40 mg total) by mouth at bedtime. 90 tablet 3  . budesonide (PULMICORT) 0.25 MG/2ML nebulizer solution TAKE 2 MLS VIA NEBULIZER DAILY 60 mL 5  . clonazePAM (KLONOPIN) 0.5 MG tablet Take 0.5 mg by mouth daily as needed for anxiety.     . diphenhydrAMINE (BENADRYL) 50 MG tablet Take 50 mg by mouth as needed for allergies (bee stings).     . donepezil (ARICEPT) 5 MG tablet Take 5 mg by mouth daily.    Marland Kitchen estradiol (ESTRACE) 0.1 MG/GM vaginal cream Place vaginally.    Marland Kitchen ibuprofen (ADVIL) 200 MG tablet Take 400 mg by mouth every 6 (six) hours as needed for moderate pain.    Marland Kitchen ipratropium (ATROVENT) 0.02 % nebulizer solution TAKE 2.5 MLS VIA NEBULIZER DAILY (Patient taking differently: Take 2.5 mLs by nebulization daily. ) 150 mL 5  . levothyroxine (SYNTHROID, LEVOTHROID) 112 MCG tablet Take 112 mcg by mouth daily.     Marland Kitchen losartan (COZAAR) 25 MG tablet Take 25 mg by mouth daily.    . mirtazapine (REMERON) 15 MG tablet Take 15 mg by mouth at bedtime.    Marland Kitchen nystatin cream (MYCOSTATIN) Apply topically.     . triamcinolone (KENALOG) 0.1 % Apply topically.     No current facility-administered medications for this visit.    Allergies  Allergen Reactions  . Betadine [Povidone Iodine] Rash  . Fluconazole In Dextrose Hives and Shortness Of Breath  . Gatifloxacin Hives and Shortness Of Breath  . Iodinated Diagnostic Agents Hives and Shortness Of Breath    States she tolerates betadine  . Ioxaglate Hives  and Shortness Of Breath  . Quinolones Hives and Shortness Of Breath  . Tequin Hives and Shortness Of Breath  . Zithromax [Azithromycin]     GI Upset  . Codeine Other (See Comments)    Makes her jittery      REVIEW OF SYSTEMS:  [X]  denotes positive finding, [ ]  denotes negative finding Cardiac  Comments:  Chest pain or chest pressure:    Shortness of breath upon exertion:    Short of breath when lying flat:    Irregular heart rhythm:        Vascular    Pain in calf, thigh, or hip brought on by ambulation:    Pain in feet at night that wakes you up from your sleep:     Blood clot in your veins:    Leg swelling:         Pulmonary    Oxygen at home:    Productive cough:     Wheezing:         Neurologic    Sudden weakness in arms or legs:     Sudden numbness in arms or legs:     Sudden onset of difficulty speaking or slurred speech:    Temporary loss of vision in one eye:     Problems with dizziness:         Gastrointestinal    Blood in stool:     Vomited blood:         Genitourinary    Burning when urinating:     Blood in urine:        Psychiatric    Major depression:         Hematologic    Bleeding problems:    Problems with blood clotting too easily:        Skin    Rashes or ulcers:        Constitutional    Fever or chills:      PHYSICAL EXAMINATION:  Vitals:   09/20/20 1048  BP: 126/64  Pulse: (!) 58  Resp: 20  Temp: 98.7 F (37.1 C)  TempSrc: Temporal  SpO2: 98%  Weight: 163 lb 9.6 oz (74.2 kg)  Height: 5\' 3"  (1.6 m)    General: very  pleasant female, well nourished, not in any distress; vitals documented above Gait: Normal HENT: WNL, normocephalic Pulmonary: normal non-labored breathing , without wheezing Cardiac: regular HR, without  Murmurs without carotid bruit Abdomen: soft, NT, no masses Vascular Exam/Pulses:  Right Left  Radial 2+ (normal) 2+ (normal)  Femoral 2+ (normal) 2+ (normal)  Popliteal Not palpable, Biphasic Doppler signal in bypass graft at thigh 2+ (normal)  DP Not palpable, Biphasic Doppler signal Not palpable, Biphasic doppler signal  PT Not palpable, Biphasic doppler signal Not Palpable, Biphasic doppler signal   Extremities: without ischemic changes, without Gangrene , without cellulitis; without open wounds;  Musculoskeletal: no muscle wasting or atrophy  Neurologic: A&O X 3;  No focal weakness or paresthesias are detected Psychiatric:  The pt has Normal affect.   Non-Invasive Vascular Imaging:   +-------+-----------+-----------+------------+------------+  ABI/TBIToday's ABIToday's TBIPrevious ABIPrevious TBI  +-------+-----------+-----------+------------+------------+  Right 0.66    0.39    0.74    0.31      +-------+-----------+-----------+------------+------------+  Left  0.78    0.4    0.83    0.35      +-------+-----------+-----------+------------+------------+  Right toe pressure is 70 mmHg Left toe pressure is 72 mmHg  Left lower extremity arterial duplex: I independently reviewed  the arterial duplex and there is 50-74% stenosis in her distal SFA otherwise biphasic waveforms throughout  ASSESSMENT/PLAN:: 84 y.o. female here for follow up for her peripheral artery disease. Her right lower extremity is without symptoms at this time and well perfused. She is having new left lower extremity symptoms. Her symptoms are cramping in her left calf only at night. She does not describe rest pain and does not have claudication on ambulation. Her  duplex does show a distal SFA lesion with elevated velocities that could be the cause of her new symptoms starting. Her ABI's although decreased from last visit are essentially unchanged. They are very similar to the ABI's prior to the last study. Additionally her toe pressures have improved from last visit. Her symptoms are not definitively arterial in nature. At this time I would not recommend intervention especially prior to her leaving town next week. I discussed with her closer follow up reassess her studies and symptoms when she returns from being out of town. - I have encouraged her to continue to stay active. She will also continue her Aspirin and Statin - She knows to call if she gets new or worsening symptoms -She was scheduled for a 6 month follow up with Dr. Scot Dock but I have arranged to bring her back for earlier follow up in January with repeat ABI   Karoline Caldwell, PA-C [facility] [phone]  Clinic MD:  Dr. Donzetta Matters

## 2020-09-21 NOTE — Progress Notes (Signed)
Subjective: Sharon Mcdaniel presents today for at risk foot care with h/o PAD. She is seen today for follow up of painful thick toenails that are difficult to trim. Pain interferes with ambulation. Aggravating factors include wearing enclosed shoe gear. Pain is relieved with periodic professional debridement.   She states she had leg cramp of LLE last night. She is planning a trip to [place], [place] and plans to stay with family for a month. She is wondering if she should have her left leg circulation assessed before she travels.  Past Medical History:  Diagnosis Date  . COPD (chronic obstructive pulmonary disease) ([place])   . Peripheral vascular disease [facility])      Patient Active Problem List   Diagnosis Date Noted  . PVD (peripheral vascular disease) ([place]) 04/27/2020  . Chronic back pain 02/05/2018  . Bilateral impacted cerumen 01/11/2018  . Neck pain 01/11/2018  . Referred otalgia of left ear 01/11/2018  . Abnormal CT of the chest 06/11/2016  . Dyspnea 06/10/2016  . Cough 06/10/2016  . HYPOTHYROIDISM 09/05/2007  . ASTHMATIC BRONCHITIS, ACUTE 09/05/2007  . EMPHYSEMA 09/05/2007  . Asthmatic bronchitis , chronic ([place]) 09/05/2007  . TEMPOROMANDIBULAR JOINT PAIN 09/05/2007     Past Surgical History:  Procedure Laterality Date  . ABDOMINAL HYSTERECTOMY  1980  . BACK SURGERY  07/2011   synovial cyst removal  . INTRAOPERATIVE ARTERIOGRAM Right 04/28/2020   Procedure: INTRA OPERATIVE ARTERIOGRAM;  Surgeon: Angelia Mould, MD;  Location: [place];  Service: Vascular;  Laterality: Right;  . THROMBECTOMY OF BYPASS GRAFT FEMORAL- POPLITEAL ARTERY Right 04/28/2020   Procedure: RIGHT FEMORAL-BELOW KNEE POPLITEAL BYPASS GRAFT USING NON-REVERSED GREATER SAPHENOUS VEIN GRAFT HARVESTED FROM RIGHT UPPER LEG. ;  Surgeon: Angelia Mould, MD;  Location: [place];  Service: Vascular;  Laterality: Right;  . TUBAL LIGATION  1970     Current Outpatient Medications on File Prior to Visit  Medication  Sig Dispense Refill  . albuterol (ACCUNEB) 1.25 MG/3ML nebulizer solution TAKE 3 MLS VIA NEBULIZER DAILY 90 mL 5  . aspirin 325 MG tablet Take 650 mg by mouth once.    Marland Kitchen atorvastatin (LIPITOR) 40 MG tablet Take 1 tablet (40 mg total) by mouth at bedtime. 90 tablet 3  . budesonide (PULMICORT) 0.25 MG/2ML nebulizer solution TAKE 2 MLS VIA NEBULIZER DAILY 60 mL 5  . clonazePAM (KLONOPIN) 0.5 MG tablet Take 0.5 mg by mouth daily as needed for anxiety.     . diphenhydrAMINE (BENADRYL) 50 MG tablet Take 50 mg by mouth as needed for allergies (bee stings).     . donepezil (ARICEPT) 5 MG tablet Take 5 mg by mouth daily.    Marland Kitchen estradiol (ESTRACE) 0.1 MG/GM vaginal cream Place vaginally.    Marland Kitchen ibuprofen (ADVIL) 200 MG tablet Take 400 mg by mouth every 6 (six) hours as needed for moderate pain.    Marland Kitchen ipratropium (ATROVENT) 0.02 % nebulizer solution TAKE 2.5 MLS VIA NEBULIZER DAILY (Patient taking differently: Take 2.5 mLs by nebulization daily. ) 150 mL 5  . levothyroxine (SYNTHROID, LEVOTHROID) 112 MCG tablet Take 112 mcg by mouth daily.     Marland Kitchen losartan (COZAAR) 25 MG tablet Take 25 mg by mouth daily.    . mirtazapine (REMERON) 15 MG tablet Take 15 mg by mouth at bedtime.    Marland Kitchen nystatin cream (MYCOSTATIN) Apply topically.    . triamcinolone (KENALOG) 0.1 % Apply topically.     No current facility-administered medications on file prior to visit.  Allergies  Allergen Reactions  . Betadine [Povidone Iodine] Rash  . Fluconazole In Dextrose Hives and Shortness Of Breath  . Gatifloxacin Hives and Shortness Of Breath  . Iodinated Diagnostic Agents Hives and Shortness Of Breath    States she tolerates betadine  . Ioxaglate Hives and Shortness Of Breath  . Quinolones Hives and Shortness Of Breath  . Tequin Hives and Shortness Of Breath  . Zithromax [Azithromycin]     GI Upset  . Codeine Other (See Comments)    Makes her jittery      Social History   Occupational History  . Occupation: retired   Tobacco Use  . Smoking status: Former Smoker    Packs/day: 1.00    Years: 30.00    Pack years: 30.00    Quit date: 11/03/2003    Years since quitting: 16.8  . Smokeless tobacco: Never Used  Vaping Use  . Vaping Use: Never used  Substance and Sexual Activity  . Alcohol use: No  . Drug use: No  . Sexual activity: Never     Family History  Problem Relation Age of Onset  . Lung cancer Mother   . Rheum arthritis Mother   . Asthma Father   . Heart disease Father      Immunization History  Administered Date(s) Administered  . Influenza Whole 08/24/2006, 07/31/2016  . Influenza, High Dose Seasonal PF 08/03/2015  . Influenza,inj,quad, With Preservative 08/02/2017  . Influenza-Unspecified 07/03/2014  . PFIZER SARS-COV-2 Vaccination 11/21/2019, 12/11/2019  . Pneumococcal Polysaccharide-23 09/02/2004     Objective: Sharon Mcdaniel is a pleasant 84 y.o.Caucasian female WD, WN in NAD.Marland Kitchen AAO x 3.  There were no vitals filed for this visit.  Vascular Examination:  Capillary refill time to digits immediate b/l. Nonpalpable DP pulse(s) b/l lower extremities. Nonpalpable PT pulse(s) b/l lower extremities. Pedal hair absent. Lower extremity skin temperature gradient within normal limits. No pain with calf compression b/l. Trace edema noted right lower extremity. No ischemia or gangrene noted b/l lower extremities.  Dermatological Examination: Pedal skin is thin shiny, atrophic b/l lower extremities. No open wounds bilaterally. No interdigital macerations bilaterally. Toenails 1-5 b/l elongated, discolored, dystrophic, thickened, crumbly with subungual debris and tenderness to dorsal palpation.  Musculoskeletal: Normal muscle strength 5/5 to all lower extremity muscle groups bilaterally. No pain crepitus or joint limitation noted with ROM b/l. Hallux valgus with bunion deformity noted b/l lower extremities.  Neurological: Protective sensation intact 5/5 intact bilaterally with 10g  monofilament b/l. Vibratory sensation diminished b/l. Proprioception intact bilaterally. Clonus negative b/l.  Assessment: 1. Pain due to onychomycosis of toenails of both feet   2. Hallux valgus, acquired, bilateral   3. PAD (peripheral artery disease) ([place])     Plan: -Examined patient. No evidence of ischemia noted on today's visit. Advised her it may be a good idea to see Dr. Scot Dock for peace of mind before she travels out of town for the month. She stated she would call his office. -Toenails 1-5 b/l were debrided in length and girth with sterile nail nippers and dremel without iatrogenic bleeding.  -Patient to report any pedal injuries to medical professional immediately. -Patient to continue soft, supportive shoe gear daily. -Patient/POA to call should there be question/concern in the interim.  Return in about 3 months (around 12/19/2020) for painful mycotic toenails.

## 2020-09-24 ENCOUNTER — Other Ambulatory Visit: Payer: Self-pay

## 2020-09-24 DIAGNOSIS — I739 Peripheral vascular disease, unspecified: Secondary | ICD-10-CM

## 2020-10-24 ENCOUNTER — Ambulatory Visit: Payer: Medicare Other | Admitting: Cardiovascular Disease

## 2020-11-08 ENCOUNTER — Other Ambulatory Visit: Payer: Self-pay

## 2020-11-08 ENCOUNTER — Ambulatory Visit: Payer: Medicare Other | Admitting: Physician Assistant

## 2020-11-08 ENCOUNTER — Ambulatory Visit (HOSPITAL_COMMUNITY)
Admission: RE | Admit: 2020-11-08 | Discharge: 2020-11-08 | Disposition: A | Discharge status: Home / Self Care | Payer: Medicare Other | Source: Ambulatory Visit | Attending: Physician Assistant | Admitting: Physician Assistant

## 2020-11-08 VITALS — BP 138/66 | HR 58 | Temp 97.8°F | Resp 16 | Ht 62.0 in | Wt 160.0 lb

## 2020-11-08 DIAGNOSIS — I739 Peripheral vascular disease, unspecified: Secondary | ICD-10-CM | POA: Insufficient documentation

## 2020-11-08 NOTE — Progress Notes (Signed)
HISTORY AND PHYSICAL     CC:  follow up. Requesting Provider:  Hayden Rasmussen, MD  HPI: This is a 85 y.o. female who is here today for follow up for PAD.  She has a hx of peripheral artery disease who underwent right femoropopliteal bypass graft on 04/28/20 by Dr. Scot Dock. She had presented with severe rest pain in the right leg with ABI of 0.30. She has been doing great since her surgery and her symptoms have been resolved at follow up. She most recently was seen in September by Dr. Scot Dock and she was doing very well with patent RLE bypass graft. She was not having any claudication, rest pain or non healing wounds of either lower extremities. She was schedule to follow up in 6 months with repeat non invasive studies.  Pt was last seen 09/20/2020 and at that time, she was having new pain in her left leg.  She was getting cramping in her left calf but only at night.  She said this was very similar to what she experienced on her right lower extremity. The left calf cramping was very severe and she had to get up and walk around and take a tablespoon of mustard to get relief. She said she did not get any tightness or cramping on ambulation. She did not have any pain in her foot or leg otherwise. She said that otherwise her leg felt good.   Her duplex revelaed a distal SFA lesion with elevated velocities that could be the cause of the sx in her leg.  Her ABI's, although decreased from previous visit were essentially unchanged but her toe pressures had improved.  It was recommended she follow up for repeat studies.    The pt returns today for that visit.  She states that she continues to have increasing cramping in the left calf at night.  She states this does not happen every night.  It improves when she gets up and eats mustard.  She states she is concerned bc this is what happened to the right leg when it didn't have blood flow.  She does not have classic sx of rest pain, non healing wounds or  claudication when walking.  Her right leg is doing well without any symptoms.  The pt is on a statin for cholesterol management.    The pt is on an aspirin.    Other AC:  none The pt is on ARB for hypertension.  The pt does not have diabetes. Tobacco hx:  former  Pt does not have family hx of AAA.  Past Medical History:  Diagnosis Date  . COPD (chronic obstructive pulmonary disease) ([place])   . Peripheral vascular disease [facility])     Past Surgical History:  Procedure Laterality Date  . ABDOMINAL HYSTERECTOMY  1980  . BACK SURGERY  07/2011   synovial cyst removal  . INTRAOPERATIVE ARTERIOGRAM Right 04/28/2020   Procedure: INTRA OPERATIVE ARTERIOGRAM;  Surgeon: Angelia Mould, MD;  Location: [place];  Service: Vascular;  Laterality: Right;  . THROMBECTOMY OF BYPASS GRAFT FEMORAL- POPLITEAL ARTERY Right 04/28/2020   Procedure: RIGHT FEMORAL-BELOW KNEE POPLITEAL BYPASS GRAFT USING NON-REVERSED GREATER SAPHENOUS VEIN GRAFT HARVESTED FROM RIGHT UPPER LEG. ;  Surgeon: Angelia Mould, MD;  Location: [place];  Service: Vascular;  Laterality: Right;  . TUBAL LIGATION  1970    Allergies  Allergen Reactions  . Betadine [Povidone Iodine] Rash  . Fluconazole In Dextrose Hives and Shortness Of Breath  . Gatifloxacin Hives and  Shortness Of Breath  . Iodinated Diagnostic Agents Hives and Shortness Of Breath    States she tolerates betadine  . Ioxaglate Hives and Shortness Of Breath  . Quinolones Hives and Shortness Of Breath  . Tequin Hives and Shortness Of Breath  . Zithromax [Azithromycin]     GI Upset  . Codeine Other (See Comments)    Makes her jittery     Current Outpatient Medications  Medication Sig Dispense Refill  . albuterol (ACCUNEB) 1.25 MG/3ML nebulizer solution TAKE 3 MLS VIA NEBULIZER DAILY 90 mL 5  . aspirin 325 MG tablet Take 650 mg by mouth once.    Marland Kitchen atorvastatin (LIPITOR) 40 MG tablet Take 1 tablet (40 mg total) by mouth at bedtime. 90 tablet 3  . budesonide  (PULMICORT) 0.25 MG/2ML nebulizer solution TAKE 2 MLS VIA NEBULIZER DAILY 60 mL 5  . clonazePAM (KLONOPIN) 0.5 MG tablet Take 0.5 mg by mouth daily as needed for anxiety.     . diphenhydrAMINE (BENADRYL) 50 MG tablet Take 50 mg by mouth as needed for allergies (bee stings).     . donepezil (ARICEPT) 5 MG tablet Take 5 mg by mouth daily.    Marland Kitchen estradiol (ESTRACE) 0.1 MG/GM vaginal cream Place vaginally.    Marland Kitchen ibuprofen (ADVIL) 200 MG tablet Take 400 mg by mouth every 6 (six) hours as needed for moderate pain.    Marland Kitchen ipratropium (ATROVENT) 0.02 % nebulizer solution TAKE 2.5 MLS VIA NEBULIZER DAILY (Patient taking differently: Take 2.5 mLs by nebulization daily. ) 150 mL 5  . levothyroxine (SYNTHROID, LEVOTHROID) 112 MCG tablet Take 112 mcg by mouth daily.     Marland Kitchen losartan (COZAAR) 25 MG tablet Take 25 mg by mouth daily.    . mirtazapine (REMERON) 15 MG tablet Take 15 mg by mouth at bedtime.    Marland Kitchen nystatin cream (MYCOSTATIN) Apply topically.    . triamcinolone (KENALOG) 0.1 % Apply topically.     No current facility-administered medications for this visit.    Family History  Problem Relation Age of Onset  . Lung cancer Mother   . Rheum arthritis Mother   . Asthma Father   . Heart disease Father     Social History   Socioeconomic History  . Marital status: Widowed    Spouse name: Not on file  . Number of children: Not on file  . Years of education: Not on file  . Highest education level: Not on file  Occupational History  . Occupation: retired  Tobacco Use  . Smoking status: Former Smoker    Packs/day: 1.00    Years: 30.00    Pack years: 30.00    Quit date: 11/03/2003    Years since quitting: 17.0  . Smokeless tobacco: Never Used  Vaping Use  . Vaping Use: Never used  Substance and Sexual Activity  . Alcohol use: No  . Drug use: No  . Sexual activity: Never  Other Topics Concern  . Not on file  Social History Narrative  . Not on file   Social Determinants of Health    Financial Resource Strain: Not on file  Food Insecurity: Not on file  Transportation Needs: Not on file  Physical Activity: Not on file  Stress: Not on file  Social Connections: Not on file  Intimate Partner Violence: Not on file     REVIEW OF SYSTEMS:   [X]  denotes positive finding, [ ]  denotes negative finding Cardiac  Comments:  Chest pain or chest pressure:  Shortness of breath upon exertion:    Short of breath when lying flat:    Irregular heart rhythm:        Vascular    Pain in calf, thigh, or hip brought on by ambulation:    Pain in feet at night that wakes you up from your sleep:     Blood clot in your veins:    Leg swelling:         Pulmonary    Oxygen at home:    Productive cough:     Wheezing:         Neurologic    Sudden weakness in arms or legs:     Sudden numbness in arms or legs:     Sudden onset of difficulty speaking or slurred speech:    Temporary loss of vision in one eye:     Problems with dizziness:         Gastrointestinal    Blood in stool:     Vomited blood:         Genitourinary    Burning when urinating:     Blood in urine:        Psychiatric    Major depression:         Hematologic    Bleeding problems:    Problems with blood clotting too easily:        Skin    Rashes or ulcers:        Constitutional    Fever or chills:      PHYSICAL EXAMINATION:  Today's Vitals   11/08/20 1050  BP: 138/66  Pulse: (!) 58  Resp: 16  Temp: 97.8 F (36.6 C)  TempSrc: Temporal  SpO2: 97%  Weight: 160 lb (72.6 kg)  Height: 5\' 2"  (1.575 m)  PainSc: 8    Body mass index is 29.26 kg/m.   General:  WDWN in NAD; vital signs documented above Gait: Not observed HENT: WNL, normocephalic Pulmonary: normal non-labored breathing , without wheezing Cardiac: regular HR, without  Murmur; without carotid bruits Abdomen: soft, NT, no masses; aortic pulse is not palpable Skin: without rashes Vascular Exam/Pulses:  Right Left  Radial 2+  (normal) 2+ (normal)  Femoral 2+ (normal) 2+ (normal)  Popliteal Unable to palpate Unable to palpate  DP Brisk monophasic doppler Faint monophasic doppler  PT Brisk monophasic doppler Monophasic doppler  Peroneal monophasic monophasic   Extremities: without ischemic changes, without Gangrene , without cellulitis; without open wounds;  Musculoskeletal: no muscle wasting or atrophy  Neurologic: A&O X 3;  No focal weakness or paresthesias are detected Psychiatric:  The pt has Normal affect.   Non-Invasive Vascular Imaging:   ABI's/TBI's on 11/08/2020: Right:  0.81/0.51 (B) - Great toe pressure: 89 Left:  0.88/0.47 (B) - Great toe pressure: 83  Previous ABI's/TBI's on 09/20/2020: Right:  0.66/0.39 (B)- Great toe pressure: 70 Left:  0.78/0.40 (B) - Great toe pressure:  72  Previous arterial duplex on 09/20/2020: -----------+--------+-----+---------------+--------+--------+  LEFT    PSV cm/sRatioStenosis    WaveformComments  +-----------+--------+-----+---------------+--------+--------+  CFA Distal 177              biphasic      +-----------+--------+-----+---------------+--------+--------+  DFA    130              biphasic      +-----------+--------+-----+---------------+--------+--------+  SFA Prox  83              biphasic      +-----------+--------+-----+---------------+--------+--------+  SFA Mid  112              biphasic      +-----------+--------+-----+---------------+--------+--------+  SFA Distal 391      50-74% stenosisbiphasic      +-----------+--------+-----+---------------+--------+--------+  POP Prox  48              biphasic      +-----------+--------+-----+---------------+--------+--------+  POP Distal 60              biphasic      +-----------+--------+-----+---------------+--------+--------+  ATA  Distal 18              biphasic      +-----------+--------+-----+---------------+--------+--------+  PTA Distal 39              biphasic      +-----------+--------+-----+---------------+--------+--------+  PERO Distal36              biphasic      +-----------+--------+-----+---------------+--------+--------+   Summary:  Left: 50-74% stenosis noted in the distal superficial femoral artery.  Upper end of range.    ASSESSMENT/PLAN:: 85 y.o. female here for follow up for right femoropopliteal bypass graft on 04/28/20 by Dr. Scot Dock and new cramping in left calf.  She was brought back today for repeat ABI    -ABI improved from last visit in November, however, the cramping in her left calf at night has gotten a little worse.  The arterial duplex in November had elevated velocities in the distal left SFA.  -She has concerns b/c this is the way her right leg started.  She does not have classic claudication, rest pain or wounds and she did not have these sx with the right side.    -discussed arteriogram vs returning in 2 months with LLE arterial duplex and ABI with pt-given this is not happening every night, she does not have classic rest pain or non healing wounds, she would prefer to return in 2 months with duplex.  With that said, she will call sooner if she has any issues prior to that visit.  -continue statin/asa   Leontine Locket, [facility] [phone]  Clinic MD:   Donzetta Matters

## 2020-11-12 ENCOUNTER — Other Ambulatory Visit: Payer: Self-pay

## 2020-11-12 DIAGNOSIS — I739 Peripheral vascular disease, unspecified: Secondary | ICD-10-CM

## 2020-11-15 ENCOUNTER — Other Ambulatory Visit: Payer: Self-pay | Admitting: Internal Medicine

## 2020-11-16 NOTE — Progress Notes (Deleted)
Cardiology Office Note   Date:  11/16/2020   ID:  Sharon Mcdaniel, DOB [birth year], MRN [id number]  PCP:  Hayden Rasmussen, MD  Cardiologist:   Jenkins Rouge, MD   No chief complaint on file.     History of Present Illness: Sharon Mcdaniel is a 85 y.o. female f/u for carotid bruits. Had some facial paresthesias with negative TIA w/u Last carotid 08/07/19 plaque no stenosis TTE in 2017 normal just mild MR On statin for HLD  Sees Dr Melvyn Novas for Gold 32 COPD Quit smoking in 2005 Significant arthritis in right hand   PVD followed by Dr Scot Dock Post right fem-pop bypass in June 2021 Now has some LLE claudication Contemplating angiogram for distal left SFA stenosis   ***    Past Medical History:  Diagnosis Date  . COPD (chronic obstructive pulmonary disease) ([place])   . Peripheral vascular disease [facility])     Past Surgical History:  Procedure Laterality Date  . ABDOMINAL HYSTERECTOMY  1980  . BACK SURGERY  07/2011   synovial cyst removal  . INTRAOPERATIVE ARTERIOGRAM Right 04/28/2020   Procedure: INTRA OPERATIVE ARTERIOGRAM;  Surgeon: Angelia Mould, MD;  Location: [place];  Service: Vascular;  Laterality: Right;  . THROMBECTOMY OF BYPASS GRAFT FEMORAL- POPLITEAL ARTERY Right 04/28/2020   Procedure: RIGHT FEMORAL-BELOW KNEE POPLITEAL BYPASS GRAFT USING NON-REVERSED GREATER SAPHENOUS VEIN GRAFT HARVESTED FROM RIGHT UPPER LEG. ;  Surgeon: Angelia Mould, MD;  Location: [place];  Service: Vascular;  Laterality: Right;  . TUBAL LIGATION  1970     Current Outpatient Medications  Medication Sig Dispense Refill  . albuterol (ACCUNEB) 1.25 MG/3ML nebulizer solution TAKE 3 MLS VIA NEBULIZER DAILY 90 mL 5  . aspirin 325 MG tablet Take 650 mg by mouth once.    Marland Kitchen atorvastatin (LIPITOR) 40 MG tablet Take 1 tablet (40 mg total) by mouth at bedtime. 90 tablet 3  . budesonide (PULMICORT) 0.25 MG/2ML nebulizer solution TAKE 2 MLS VIA NEBULIZER DAILY 60 mL 5  . clonazePAM (KLONOPIN) 0.5 MG  tablet Take 0.5 mg by mouth daily as needed for anxiety.     . diphenhydrAMINE (BENADRYL) 50 MG tablet Take 50 mg by mouth as needed for allergies (bee stings).     . donepezil (ARICEPT) 5 MG tablet Take 5 mg by mouth daily.    Marland Kitchen estradiol (ESTRACE) 0.1 MG/GM vaginal cream Place vaginally.    Marland Kitchen ibuprofen (ADVIL) 200 MG tablet Take 400 mg by mouth every 6 (six) hours as needed for moderate pain.    Marland Kitchen ipratropium (ATROVENT) 0.02 % nebulizer solution TAKE 2.5 MLS VIA NEBULIZER DAILY (Patient taking differently: Take 2.5 mLs by nebulization daily.) 150 mL 5  . levothyroxine (SYNTHROID, LEVOTHROID) 112 MCG tablet Take 112 mcg by mouth daily.    Marland Kitchen losartan (COZAAR) 25 MG tablet Take 25 mg by mouth daily.    . mirtazapine (REMERON) 15 MG tablet Take 15 mg by mouth at bedtime.    Marland Kitchen nystatin cream (MYCOSTATIN) Apply topically.    . triamcinolone (KENALOG) 0.1 % Apply topically.     No current facility-administered medications for this visit.    Allergies:   Betadine [povidone iodine], Fluconazole in dextrose, Gatifloxacin, Iodinated diagnostic agents, Ioxaglate, Quinolones, Tequin, Zithromax [azithromycin], and Codeine    Social History:  The patient  reports that she quit smoking about 17 years ago. She has a 30.00 pack-year smoking history. She has never used smokeless tobacco. She reports that she does not  drink alcohol and does not use drugs.   Family History:  The patient's family history includes Asthma in her father; Heart disease in her father; Lung cancer in her mother; Rheum arthritis in her mother.    ROS:  Please see the history of present illness.   Otherwise, review of systems are positive for none.   All other systems are reviewed and negative.    PHYSICAL EXAM: VS:  There were no vitals taken for this visit. , BMI There is no height or weight on file to calculate BMI. Affect appropriate Healthy:  appears stated age 3: normal Neck supple with no adenopathy JVP normal left  bruits no thyromegaly Lungs clear with no wheezing and good diaphragmatic motion Heart:  S1/S2 no murmur, no rub, gallop or click PMI normal Abdomen: benighn, BS positve, no tenderness, no AAA no bruit.  No HSM or HJR Post right fem pop bypass  Plus one bilateral edema worse on left with varicosities  Neuro non-focal Skin warm and dry Arthritis in hands right worse    EKG:   NSR mild LAE normal 10/06/17 NSR rate 58 LAE normal 10/20/18 SR rate 59 LAE otherwise normal  10/23/19 SR rate 71 normal   Recent Labs: 04/29/2020: ALT 9; BUN 12; Creatinine, Ser 0.91; Potassium 3.9; Sodium 135 04/30/2020: Hemoglobin 10.2; Platelets 138    Lipid Panel    Component Value Date/Time   CHOL 119 04/29/2020 0312   CHOL 144 10/05/2017 1534   TRIG 51 04/29/2020 0312   HDL 67 04/29/2020 0312   HDL 87 10/05/2017 1534   CHOLHDL 1.8 04/29/2020 0312   VLDL 10 04/29/2020 0312   LDLCALC 42 04/29/2020 0312   LDLCALC 39 10/05/2017 1534      Wt Readings from Last 3 Encounters:  11/08/20 72.6 kg  09/20/20 74.2 kg  07/31/20 73 kg      Other studies Reviewed: Additional studies/ records that were reviewed today include: Notes Dr Einar Gip Echo and duplex .Carotid duplex 08/07/19     ASSESSMENT AND PLAN:  1.  PVD:  Right fem-pop 04/28/20 Dr Scot Dock Claudication in LLE to f/u with Dr Scot Dock to consider angiogram ABI left 0.88 with elevated distal left SFA velocity Of 3.9 m/sec 2.  Left Carotid Bruit:  Duplex 08/07/19 plaque no stenosis  3. Thyroid  Euthyroid continue current replacement dose TSH with primary  4. GERD low carb diet pepcid  5. Varicose Veins:  Left [place] no DVT 09/30/16 f/u [facility]  6. COPD:  F/u [facility] CT October 2017 improved   7. HLD:  Continue statin labs with primary LDL 39 10/05/17    Current medicines are reviewed at length with the patient today.  The patient does not have concerns regarding medicines.  The following changes have been made:  no change  Labs/ tests ordered today  include: None   No orders of the defined types were placed in this encounter.    Disposition:   FU with me in a year      Signed, Jenkins Rouge, MD  11/16/2020 12:28 PM    [facility] [address], [place], [place]  [address] Phone: [phone]; Fax: [phone]

## 2020-11-22 ENCOUNTER — Ambulatory Visit: Payer: Medicare Other | Admitting: Cardiovascular Disease

## 2020-12-25 ENCOUNTER — Other Ambulatory Visit: Payer: Self-pay | Admitting: Family Medicine

## 2020-12-25 DIAGNOSIS — R748 Abnormal levels of other serum enzymes: Secondary | ICD-10-CM

## 2020-12-25 DIAGNOSIS — K8010 Calculus of gallbladder with chronic cholecystitis without obstruction: Secondary | ICD-10-CM

## 2021-01-08 ENCOUNTER — Encounter (HOSPITAL_COMMUNITY): Payer: Medicare Other

## 2021-01-08 ENCOUNTER — Ambulatory Visit: Payer: Medicare Other | Admitting: Vascular Surgery

## 2021-01-27 ENCOUNTER — Ambulatory Visit
Admission: RE | Admit: 2021-01-27 | Discharge: 2021-01-27 | Disposition: A | Discharge status: Home / Self Care | Payer: Medicare Other | Source: Ambulatory Visit | Attending: Family Medicine | Admitting: Family Medicine

## 2021-01-27 DIAGNOSIS — R748 Abnormal levels of other serum enzymes: Secondary | ICD-10-CM

## 2021-01-27 DIAGNOSIS — K8010 Calculus of gallbladder with chronic cholecystitis without obstruction: Secondary | ICD-10-CM

## 2021-01-29 ENCOUNTER — Telehealth: Payer: Self-pay | Admitting: Internal Medicine

## 2021-01-29 NOTE — Progress Notes (Incomplete)
Cardiology Office Note   Date:  01/29/2021   ID:  Akshita, Italiano 14-Oct-1936, MRN [id number]  PCP:  Hayden Rasmussen, MD  Cardiologist:   Jenkins Rouge, MD   No chief complaint on file.     History of Present Illness: HAYLO FAKE is a 85 y.o. female f/u for carotid bruits. Had some facial paresthesias with negative TIA w/u negative. Last carotid 08/07/19 plaque no stenosis TTE in 2017 normal just mild MR On statin for HLD Sees Dr Melvyn Novas for Gold 32 COPD Quit smoking in 2005 Significant arthritis in right hand   She is my wife's stepmother;s mom Granddaughter Andee Poles moved to [place] from [place] With twin boys Arnell Sieving and Juanda Crumble   Admitted to [facility] 6/26-6/30 with acute RLE ischemia with occluded SFA She had right fem pop Bypass by Dr Doren Custard F/U ABI's 11/08/20 improved 0.81 on right and 0.88 on left  ***   Past Medical History:  Diagnosis Date  . COPD (chronic obstructive pulmonary disease) ([place])   . Peripheral vascular disease [facility])     Past Surgical History:  Procedure Laterality Date  . ABDOMINAL HYSTERECTOMY  1980  . BACK SURGERY  07/2011   synovial cyst removal  . INTRAOPERATIVE ARTERIOGRAM Right 04/28/2020   Procedure: INTRA OPERATIVE ARTERIOGRAM;  Surgeon: Angelia Mould, MD;  Location: [place];  Service: Vascular;  Laterality: Right;  . THROMBECTOMY OF BYPASS GRAFT FEMORAL- POPLITEAL ARTERY Right 04/28/2020   Procedure: RIGHT FEMORAL-BELOW KNEE POPLITEAL BYPASS GRAFT USING NON-REVERSED GREATER SAPHENOUS VEIN GRAFT HARVESTED FROM RIGHT UPPER LEG. ;  Surgeon: Angelia Mould, MD;  Location: [place];  Service: Vascular;  Laterality: Right;  . TUBAL LIGATION  1970     Current Outpatient Medications  Medication Sig Dispense Refill  . albuterol (ACCUNEB) 1.25 MG/3ML nebulizer solution TAKE 3 MLS VIA NEBULIZER DAILY 90 mL 5  . aspirin 325 MG tablet Take 650 mg by mouth once.    Marland Kitchen atorvastatin (LIPITOR) 40 MG tablet Take 1 tablet (40 mg total) by  mouth at bedtime. 90 tablet 3  . budesonide (PULMICORT) 0.25 MG/2ML nebulizer solution TAKE 2 MLS VIA NEBULIZER DAILY 60 mL 5  . clonazePAM (KLONOPIN) 0.5 MG tablet Take 0.5 mg by mouth daily as needed for anxiety.     . diphenhydrAMINE (BENADRYL) 50 MG tablet Take 50 mg by mouth as needed for allergies (bee stings).     . donepezil (ARICEPT) 5 MG tablet Take 5 mg by mouth daily.    Marland Kitchen estradiol (ESTRACE) 0.1 MG/GM vaginal cream Place vaginally.    Marland Kitchen ibuprofen (ADVIL) 200 MG tablet Take 400 mg by mouth every 6 (six) hours as needed for moderate pain.    Marland Kitchen ipratropium (ATROVENT) 0.02 % nebulizer solution TAKE 2.5 MLS VIA NEBULIZER DAILY (Patient taking differently: Take 2.5 mLs by nebulization daily.) 150 mL 5  . levothyroxine (SYNTHROID, LEVOTHROID) 112 MCG tablet Take 112 mcg by mouth daily.    Marland Kitchen losartan (COZAAR) 25 MG tablet Take 25 mg by mouth daily.    . mirtazapine (REMERON) 15 MG tablet Take 15 mg by mouth at bedtime.    Marland Kitchen nystatin cream (MYCOSTATIN) Apply topically.    . triamcinolone (KENALOG) 0.1 % Apply topically.     No current facility-administered medications for this visit.    Allergies:   Betadine [povidone iodine], Fluconazole in dextrose, Gatifloxacin, Iodinated diagnostic agents, Ioxaglate, Quinolones, Tequin, Zithromax [azithromycin], and Codeine    Social History:  The patient  reports  that she quit smoking about 17 years ago. She has a 30.00 pack-year smoking history. She has never used smokeless tobacco. She reports that she does not drink alcohol and does not use drugs.   Family History:  The patient's family history includes Asthma in her father; Heart disease in her father; Lung cancer in her mother; Rheum arthritis in her mother.    ROS:  Please see the history of present illness.   Otherwise, review of systems are positive for none.   All other systems are reviewed and negative.    PHYSICAL EXAM: VS:  There were no vitals taken for this visit. , BMI There is no  height or weight on file to calculate BMI. Affect appropriate Healthy:  appears stated age 49: normal Neck supple with no adenopathy JVP normal left bruits no thyromegaly Lungs clear with no wheezing and good diaphragmatic motion Heart:  S1/S2 no murmur, no rub, gallop or click PMI normal Abdomen: benighn, BS positve, no tenderness, no AAA no bruit.  No HSM or HJR Post right fem pop bypass Plus one bilateral edema worse on left with varicosities  Neuro non-focal Skin warm and dry Arthritis in hands right worse    EKG:   NSR mild LAE normal 10/06/17 NSR rate 58 LAE normal 10/20/18 SR rate 59 LAE otherwise normal  10/23/19 SR rate 71 normal   Recent Labs: 04/29/2020: ALT 9; BUN 12; Creatinine, Ser 0.91; Potassium 3.9; Sodium 135 04/30/2020: Hemoglobin 10.2; Platelets 138    Lipid Panel    Component Value Date/Time   CHOL 119 04/29/2020 0312   CHOL 144 10/05/2017 1534   TRIG 51 04/29/2020 0312   HDL 67 04/29/2020 0312   HDL 87 10/05/2017 1534   CHOLHDL 1.8 04/29/2020 0312   VLDL 10 04/29/2020 0312   LDLCALC 42 04/29/2020 0312   LDLCALC 39 10/05/2017 1534      Wt Readings from Last 3 Encounters:  11/08/20 72.6 kg  09/20/20 74.2 kg  07/31/20 73 kg      Other studies Reviewed: Additional studies/ records that were reviewed today include: Notes Dr Einar Gip Echo and duplex .Carotid duplex 08/07/19     ASSESSMENT AND PLAN:  1.  Facial Paresthesia etiology not clear but low likelyhood of TIA doubt PAF resolved 2.  Left Carotid Bruit:  Duplex 08/07/19 plaque no stenosis f/u October this year  3. Thyroid  Euthyroid continue current replacement dose TSH with primary  4. GERD low carb diet pepcid  5. Varicose Veins:  Left [place] no DVT 09/30/16 f/u [facility]  6. COPD:  F/u Wert CT October 2017 improved   7. HLD:  Continue statin labs with primary LDL 39 10/05/17   8. PVD: post acute right fem-pop bypass 04/28/26 f/u Dr Doren Custard   Current medicines are reviewed at length with the  patient today.  The patient does not have concerns regarding medicines.  The following changes have been made:  no change  Labs/ tests ordered today include: Carotid October 2022   No orders of the defined types were placed in this encounter.    Disposition:   FU with me in a year      Signed, Jenkins Rouge, MD  01/29/2021 12:06 PM    [facility] [place], [place], [place]  [address] Phone: [phone]; Fax: [phone]

## 2021-01-29 NOTE — Telephone Encounter (Signed)
Spoke with pt and verified that she did not need anything for appointment scheduled for tomorrow (01/30/21). Pt stated no needs at this time. Nothing further needed at this time.

## 2021-01-30 ENCOUNTER — Encounter: Payer: Self-pay | Admitting: Adult Health

## 2021-01-30 ENCOUNTER — Ambulatory Visit: Payer: Medicare Other | Admitting: Adult Health

## 2021-01-30 ENCOUNTER — Ambulatory Visit (INDEPENDENT_AMBULATORY_CARE_PROVIDER_SITE_OTHER): Payer: Medicare Other

## 2021-01-30 ENCOUNTER — Other Ambulatory Visit: Payer: Self-pay

## 2021-01-30 VITALS — BP 116/50 | HR 83 | Temp 97.4°F | Ht 62.0 in | Wt 160.0 lb

## 2021-01-30 DIAGNOSIS — J42 Unspecified chronic bronchitis: Secondary | ICD-10-CM | POA: Diagnosis not present

## 2021-01-30 DIAGNOSIS — J438 Other emphysema: Secondary | ICD-10-CM

## 2021-01-30 DIAGNOSIS — R0609 Other forms of dyspnea: Secondary | ICD-10-CM

## 2021-01-30 DIAGNOSIS — R0600 Dyspnea, unspecified: Secondary | ICD-10-CM | POA: Diagnosis not present

## 2021-01-30 DIAGNOSIS — J449 Chronic obstructive pulmonary disease, unspecified: Secondary | ICD-10-CM | POA: Diagnosis not present

## 2021-01-30 MED ORDER — PREDNISONE 20 MG PO TABS: ORAL_TABLET | ORAL | 1 refills | Status: DC

## 2021-01-30 MED ORDER — ALBUTEROL SULFATE 1.25 MG/3ML IN NEBU: 1.0000 | INHALATION_SOLUTION | Freq: Two times a day (BID) | RESPIRATORY_TRACT | 12 refills | Status: DC

## 2021-01-30 MED ORDER — BUDESONIDE 0.25 MG/2ML IN SUSP: 0.2500 mg | Freq: Two times a day (BID) | RESPIRATORY_TRACT | 12 refills | Status: DC

## 2021-01-30 MED ORDER — IPRATROPIUM BROMIDE 0.02 % IN SOLN: 0.5000 mg | Freq: Two times a day (BID) | RESPIRATORY_TRACT | 12 refills | Status: DC

## 2021-01-30 NOTE — Assessment & Plan Note (Signed)
Increase Nebs Twice daily   Chest xray today   Plan  Patient Instructions  Increase Pulmicort /Ipratropium/Albuterol Neb Twice daily   Prednisone 20mg  2 tabs daily for 2 days then 1 tab daily for 2 days and 1/2 tab daily for 2 days and stop .  Mucinex DM Twice daily  As needed  Cough/congestion  Chest xray today .  Follow up with PCP next week with labs as planned  Follow up with Dr. Melvyn Novas  As planned and As needed   Please contact office for sooner follow up if symptoms do not improve or worsen or seek emergency care

## 2021-01-30 NOTE — Patient Instructions (Signed)
Increase Pulmicort /Ipratropium/Albuterol Neb Twice daily   Prednisone 20mg  2 tabs daily for 2 days then 1 tab daily for 2 days and 1/2 tab daily for 2 days and stop .  Mucinex DM Twice daily  As needed  Cough/congestion  Chest xray today .  Follow up with PCP next week with labs as planned  Follow up with Dr. Melvyn Novas  As planned and As needed   Please contact office for sooner follow up if symptoms do not improve or worsen or seek emergency care

## 2021-01-30 NOTE — Assessment & Plan Note (Signed)
Acute COPD flare.-Check chest x-ray. Hold on antibiotics at this time.  Short course of prednisone.  Plan  Patient Instructions  Increase Pulmicort /Ipratropium/Albuterol Neb Twice daily   Prednisone 20mg  2 tabs daily for 2 days then 1 tab daily for 2 days and 1/2 tab daily for 2 days and stop .  Mucinex DM Twice daily  As needed  Cough/congestion  Chest xray today .  Follow up with PCP next week with labs as planned  Follow up with Dr. Melvyn Novas  As planned and As needed   Please contact office for sooner follow up if symptoms do not improve or worsen or seek emergency care

## 2021-01-30 NOTE — Progress Notes (Signed)
Called and spoke with patient, advised of results/recommendations of cxr per Rexene Edison NP.  She verbalized understanding.  Nothing further needed.

## 2021-01-30 NOTE — Progress Notes (Signed)
_0  ID: Sharon Mcdaniel, female    DOB: Mar 29, 1936, 85 y.o.   MRN: [id number]  Chief Complaint  Patient presents with  . Follow-up    Referring provider: Hayden Rasmussen, MD  HPI: 85 year old female former smoker followed for moderate COPD and asthmatic bronchitis  TEST/EVENTS :  PFTs July 06, 2006   FEV1 of 58% predicted with a ratio of 56%  And 15% improvement after bronchodilator consistent with an asthmatic component. Allergy profile 06/10/2016 >  Eos 0.1/  IgE 7 RAST neg  - Sinus CT 06/15/2016 > neg    - PFT's  07/08/2016  FEV1 1.45 (79 % ) ratio 74  p 10 % improvement from saba p duoneb > 6 h  prior to study with DLCO  52 % corrects to 71  % for alv volume    High-resolution CT chest October 2017 - for interstitial lung disease, biapical pleural-parenchymal scarring unchanged since 2005.  Subpleural nodular consolidation in the right upper lobe dating back to 2005 consistent with a benign etiology.  01/30/2021 Acute OV : Cough , COPD /AB  Patient presents for an acute office visit.  Patient complains of 1 week of increased shortness of breath , no significant cough, congestion , wheezing. No chest pain  Covid home test negative. NO calf pain ,hemoptysis or leg swelling . Remains on Pulmicort Erick Alley Zettie Cooley daily .  Lives with daughter . Drives and remains independent .  No confusion .  Appetite is good .    Allergies  Allergen Reactions  . Betadine [Povidone Iodine] Rash  . Fluconazole In Dextrose Hives and Shortness Of Breath  . Gatifloxacin Hives and Shortness Of Breath  . Iodinated Diagnostic Agents Hives and Shortness Of Breath    States she tolerates betadine  . Ioxaglate Hives and Shortness Of Breath  . Quinolones Hives and Shortness Of Breath  . Tequin Hives and Shortness Of Breath  . Zithromax [Azithromycin]     GI Upset  . Codeine Other (See Comments)    Makes her jittery     Immunization History  Administered Date(s) Administered  . Fluad  Quad(high Dose 65+) 07/03/2020  . Influenza Whole 08/24/2006, 07/31/2016  . Influenza, High Dose Seasonal PF 08/03/2015  . Influenza,inj,quad, With Preservative 08/02/2017  . Influenza-Unspecified 07/03/2014  . PFIZER(Purple Top)SARS-COV-2 Vaccination 11/21/2019, 12/11/2019  . Pneumococcal Polysaccharide-23 09/02/2004    Past Medical History:  Diagnosis Date  . COPD (chronic obstructive pulmonary disease) ([place])   . Peripheral vascular disease ([place])     Tobacco History: Social History   Tobacco Use  Smoking Status Former Smoker  . Packs/day: 1.00  . Years: 30.00  . Pack years: 30.00  . Quit date: 11/03/2003  . Years since quitting: 17.2  Smokeless Tobacco Never Used   Counseling given: Not Answered   Outpatient Medications Prior to Visit  Medication Sig Dispense Refill  . aspirin 325 MG tablet Take 650 mg by mouth once.    Marland Kitchen atorvastatin (LIPITOR) 40 MG tablet Take 1 tablet (40 mg total) by mouth at bedtime. 90 tablet 3  . clonazePAM (KLONOPIN) 0.5 MG tablet Take 0.5 mg by mouth daily as needed for anxiety.     . diphenhydrAMINE (BENADRYL) 50 MG tablet Take 50 mg by mouth as needed for allergies (bee stings).     . donepezil (ARICEPT) 5 MG tablet Take 5 mg by mouth daily.    Marland Kitchen estradiol (ESTRACE) 0.1 MG/GM vaginal cream Place vaginally.    Marland Kitchen ibuprofen (ADVIL) 200  MG tablet Take 400 mg by mouth every 6 (six) hours as needed for moderate pain.    Marland Kitchen levothyroxine (SYNTHROID, LEVOTHROID) 112 MCG tablet Take 112 mcg by mouth daily.    Marland Kitchen losartan (COZAAR) 25 MG tablet Take 25 mg by mouth daily.    . mirtazapine (REMERON) 15 MG tablet Take 15 mg by mouth at bedtime.    Marland Kitchen nystatin cream (MYCOSTATIN) Apply topically.    . triamcinolone (KENALOG) 0.1 % Apply topically.    Marland Kitchen albuterol (ACCUNEB) 1.25 MG/3ML nebulizer solution TAKE 3 MLS VIA NEBULIZER DAILY 90 mL 5  . budesonide (PULMICORT) 0.25 MG/2ML nebulizer solution TAKE 2 MLS VIA NEBULIZER DAILY 60 mL 5  . ipratropium (ATROVENT)  0.02 % nebulizer solution TAKE 2.5 MLS VIA NEBULIZER DAILY (Patient taking differently: Take 2.5 mLs by nebulization daily.) 150 mL 5   No facility-administered medications prior to visit.     Review of Systems:   Constitutional:   No  weight loss, night sweats,  Fevers, chills, + fatigue, or  lassitude.  HEENT:   No headaches,  Difficulty swallowing,  Tooth/dental problems, or  Sore throat,                No sneezing, itching, ear ache, nasal congestion, post nasal drip,   CV:  No chest pain,  Orthopnea, PND, swelling in lower extremities, anasarca, dizziness, palpitations, syncope.   GI  No heartburn, indigestion, abdominal pain, nausea, vomiting, diarrhea, change in bowel habits, loss of appetite, bloody stools.   Resp:  No chest wall deformity  Skin: no rash or lesions.  GU: no dysuria, change in color of urine, no urgency or frequency.  No flank pain, no hematuria   MS:  No joint pain or swelling.  No decreased range of motion.  No back pain.    Physical Exam  BP (!) 116/50 (BP Location: Left Arm, Patient Position: Sitting, Cuff Size: Normal)   Pulse 83   Temp (!) 97.4 F (36.3 C) (Temporal)   Ht _0  (1.575 m)   Wt 160 lb (72.6 kg)   SpO2 95%   BMI 29.26 kg/m   GEN: A/Ox3; pleasant , NAD, well nourished    HEENT:  [place]/AT,   NOSE-clear, THROAT-clear, no lesions, no postnasal drip or exudate noted.   NECK:  Supple w/ fair ROM; no JVD; normal carotid impulses w/o bruits; no thyromegaly or nodules palpated; no lymphadenopathy.    RESP  Clear  P & A; w/o, wheezes/ rales/ or rhonchi. no accessory muscle use, no dullness to percussion  CARD:  RRR, no m/r/g, no peripheral edema, pulses intact, no cyanosis or clubbing.  GI:   Soft & nt; nml bowel sounds; no organomegaly or masses detected.   Musco: Warm bil, no deformities or joint swelling noted.   Neuro: alert, no focal deficits noted.    Skin: Warm, no lesions or rashes    Lab Results:  CBC  BNP No  results found for: BNP  ProBNP  Imaging: US Abdomen Limited RUQ (LIVER/GB)  Result Date: 01/27/2021 CLINICAL DATA:  Elevated LFTs and alk-phos with intermittent right upper quadrant pain. EXAM: ULTRASOUND ABDOMEN LIMITED RIGHT UPPER QUADRANT COMPARISON:  None. FINDINGS: Gallbladder: No gallstones or wall thickening visualized. No sonographic Murphy sign noted by sonographer. Common bile duct: Diameter: 2 mm Liver: No focal lesion identified. Within normal limits in parenchymal echogenicity. Portal vein is patent on color Doppler imaging with normal direction of blood flow towards the liver. Other: None. IMPRESSION: Unremarkable  right upper quadrant ultrasound. Electronically Signed   By: Dahlia Bailiff MD   On: 01/27/2021 20:58      PFT Results Latest Ref Rng & Units 07/08/2016  FVC-Pre L 1.87  FVC-Predicted Pre % 76  FVC-Post L 1.96  FVC-Predicted Post % 79  Pre FEV1/FVC % % 70  Post FEV1/FCV % % 74  FEV1-Pre L 1.32  FEV1-Predicted Pre % 72  FEV1-Post L 1.45  DLCO uncorrected ml/min/mmHg 11.23  DLCO UNC% % 52  DLVA Predicted % 71  TLC L 4.37  TLC % Predicted % 91  RV % Predicted % 112    No results found for: NITRICOXIDE      Assessment & Plan:   Asthmatic bronchitis , chronic (HCC) Acute COPD flare.-Check chest x-ray. Hold on antibiotics at this time.  Short course of prednisone.  Plan  Patient Instructions  Increase Pulmicort /Ipratropium/Albuterol Neb Twice daily   Prednisone 65m 2 tabs daily for 2 days then 1 tab daily for 2 days and 1/2 tab daily for 2 days and stop .  Mucinex DM Twice daily  As needed  Cough/congestion  Chest xray today .  Follow up with PCP next week with labs as planned  Follow up with Dr. WMelvyn Novas As planned and As needed   Please contact office for sooner follow up if symptoms do not improve or worsen or seek emergency care       EMPHYSEMA Increase Nebs Twice daily   Chest xray today   Plan  Patient Instructions  Increase Pulmicort  /Ipratropium/Albuterol Neb Twice daily   Prednisone 224m2 tabs daily for 2 days then 1 tab daily for 2 days and 1/2 tab daily for 2 days and stop .  Mucinex DM Twice daily  As needed  Cough/congestion  Chest xray today .  Follow up with PCP next week with labs as planned  Follow up with Dr. WeMelvyn NovasAs planned and As needed   Please contact office for sooner follow up if symptoms do not improve or worsen or seek emergency care          TaRexene EdisonNP 01/30/2021

## 2021-02-06 ENCOUNTER — Ambulatory Visit: Payer: Medicare Other | Admitting: Cardiovascular Disease

## 2021-02-12 ENCOUNTER — Ambulatory Visit (INDEPENDENT_AMBULATORY_CARE_PROVIDER_SITE_OTHER)
Admission: RE | Admit: 2021-02-12 | Discharge: 2021-02-12 | Disposition: A | Discharge status: Home / Self Care | Payer: Medicare Other | Source: Ambulatory Visit | Attending: Physician Assistant | Admitting: Physician Assistant

## 2021-02-12 ENCOUNTER — Encounter: Payer: Self-pay | Admitting: Vascular Surgery

## 2021-02-12 ENCOUNTER — Ambulatory Visit (HOSPITAL_COMMUNITY)
Admission: RE | Admit: 2021-02-12 | Discharge: 2021-02-12 | Disposition: A | Discharge status: Home / Self Care | Payer: Medicare Other | Source: Ambulatory Visit | Attending: Vascular Surgery | Admitting: Vascular Surgery

## 2021-02-12 ENCOUNTER — Ambulatory Visit: Payer: Medicare Other | Admitting: Vascular Surgery

## 2021-02-12 ENCOUNTER — Other Ambulatory Visit: Payer: Self-pay

## 2021-02-12 VITALS — BP 127/60 | HR 62 | Temp 98.4°F | Resp 20 | Ht 62.0 in | Wt 160.0 lb

## 2021-02-12 DIAGNOSIS — I739 Peripheral vascular disease, unspecified: Secondary | ICD-10-CM

## 2021-02-12 NOTE — Progress Notes (Signed)
REASON FOR VISIT:   Follow-up of peripheral vascular disease.  MEDICAL ISSUES:   PERIPHERAL VASCULAR DISEASE: Her right femoropopliteal bypass graft is patent.  She has a palpable popliteal pulse on the right.  She has known tibial disease.  On the left side she has an SFA stenosis but is asymptomatic.  She is 85 years old.  I certainly would not recommend an aggressive approach to this unless she developed disabling claudication, rest pain, or nonhealing ulcer.  She is not a smoker.  She is on aspirin and a statin.  I encouraged her to stay as active as possible.  I have ordered a graft duplex of her right femoropopliteal bypass in 1 year and ABIs at that time.  She knows to call sooner if she has problems.  HPI:   Sharon Mcdaniel is a pleasant 85 y.o. female who I last saw on 07/31/2020 for follow-up of her peripheral vascular disease.  She underwent a right femoropopliteal bypass on 04/28/2020.  This was a right femoral below-knee pop bypass with vein.  She has severe tibial disease bilaterally.  When I saw her last her graft was patent.  She was last seen in our office by the physicians assistant on 11/08/2020.  She had developed some symptoms in the left leg and underwent a venous duplex scan which showed a moderate stenosis in the distal superficial femoral artery on the left.  She was set up for a follow-up visit with me.  Since I saw her last she has been doing very well.  She denies any claudication.  She denies rest pain.  She denies any nonhealing ulcers.  She is not a smoker.  She is on aspirin and is on a statin.  There have been no significant changes to her medical history.  Past Medical History:  Diagnosis Date  . COPD (chronic obstructive pulmonary disease) ([place])   . Peripheral vascular disease ([place])     Family History  Problem Relation Age of Onset  . Lung cancer Mother   . Rheum arthritis Mother   . Asthma Father   . Heart disease Father     SOCIAL HISTORY: Social  History   Tobacco Use  . Smoking status: Former Smoker    Packs/day: 1.00    Years: 30.00    Pack years: 30.00    Quit date: 11/03/2003    Years since quitting: 17.2  . Smokeless tobacco: Never Used  Substance Use Topics  . Alcohol use: No    Allergies  Allergen Reactions  . Betadine [Povidone Iodine] Rash  . Fluconazole In Dextrose Hives and Shortness Of Breath  . Gatifloxacin Hives and Shortness Of Breath  . Iodinated Diagnostic Agents Hives and Shortness Of Breath    States she tolerates betadine  . Ioxaglate Hives and Shortness Of Breath  . Quinolones Hives and Shortness Of Breath  . Tequin Hives and Shortness Of Breath  . Zithromax [Azithromycin]     GI Upset  . Codeine Other (See Comments)    Makes her jittery     Current Outpatient Medications  Medication Sig Dispense Refill  . albuterol (ACCUNEB) 1.25 MG/3ML nebulizer solution Take 3 mLs (1.25 mg total) by nebulization 2 (two) times daily. 180 mL 12  . aspirin 325 MG tablet Take 650 mg by mouth once.    Marland Kitchen atorvastatin (LIPITOR) 40 MG tablet Take 1 tablet (40 mg total) by mouth at bedtime. 90 tablet 3  . budesonide (PULMICORT) 0.25 MG/2ML nebulizer solution  Take 2 mLs (0.25 mg total) by nebulization 2 (two) times daily. 120 mL 12  . clonazePAM (KLONOPIN) 0.5 MG tablet Take 0.5 mg by mouth daily as needed for anxiety.     . diphenhydrAMINE (BENADRYL) 50 MG tablet Take 50 mg by mouth as needed for allergies (bee stings).     . donepezil (ARICEPT) 5 MG tablet Take 5 mg by mouth daily.    Marland Kitchen estradiol (ESTRACE) 0.1 MG/GM vaginal cream Place vaginally.    Marland Kitchen ibuprofen (ADVIL) 200 MG tablet Take 400 mg by mouth every 6 (six) hours as needed for moderate pain.    Marland Kitchen ipratropium (ATROVENT) 0.02 % nebulizer solution Take 2.5 mLs (0.5 mg total) by nebulization 2 (two) times daily. 150 mL 12  . levothyroxine (SYNTHROID, LEVOTHROID) 112 MCG tablet Take 112 mcg by mouth daily.    Marland Kitchen losartan (COZAAR) 25 MG tablet Take 25 mg by mouth  daily.    . mirtazapine (REMERON) 15 MG tablet Take 15 mg by mouth at bedtime.    Marland Kitchen nystatin cream (MYCOSTATIN) Apply topically.    . predniSONE (DELTASONE) 20 MG tablet 2 tabs for 2 days,  1 tabs for 2 days, then 1/2 tab for 2 days, then stop 8 tablet 1  . triamcinolone (KENALOG) 0.1 % Apply topically.     No current facility-administered medications for this visit.    REVIEW OF SYSTEMS:  [X]  denotes positive finding, [ ]  denotes negative finding Cardiac  Comments:  Chest pain or chest pressure:    Shortness of breath upon exertion:    Short of breath when lying flat:    Irregular heart rhythm:        Vascular    Pain in calf, thigh, or hip brought on by ambulation:    Pain in feet at night that wakes you up from your sleep:     Blood clot in your veins:    Leg swelling:         Pulmonary    Oxygen at home:    Productive cough:     Wheezing:         Neurologic    Sudden weakness in arms or legs:     Sudden numbness in arms or legs:     Sudden onset of difficulty speaking or slurred speech:    Temporary loss of vision in one eye:     Problems with dizziness:         Gastrointestinal    Blood in stool:     Vomited blood:         Genitourinary    Burning when urinating:     Blood in urine:        Psychiatric    Major depression:         Hematologic    Bleeding problems:    Problems with blood clotting too easily:        Skin    Rashes or ulcers:        Constitutional    Fever or chills:     PHYSICAL EXAM:   Vitals:   02/12/21 1350  BP: 127/60  Pulse: 62  Resp: 20  Temp: 98.4 F (36.9 C)  SpO2: 96%  Weight: 160 lb (72.6 kg)  Height: 5\' 2"  (1.575 m)    GENERAL: The patient is a well-nourished female, in no acute distress. The vital signs are documented above. CARDIAC: There is a regular rate and rhythm.  VASCULAR: I do not detect carotid bruits.  On the right side she has a palpable femoral and popliteal pulse.  I cannot palpate pedal pulses. On  the left side she has a palpable femoral pulse and a diminished left popliteal pulse.  I cannot palpate pedal pulses. She has no significant lower extremity swelling. PULMONARY: There is good air exchange bilaterally without wheezing or rales. ABDOMEN: Soft and non-tender with normal pitched bowel sounds.  MUSCULOSKELETAL: There are no major deformities or cyanosis. NEUROLOGIC: No focal weakness or paresthesias are detected. SKIN: There are no ulcers or rashes noted. PSYCHIATRIC: The patient has a normal affect.  DATA:    ARTERIAL DOPPLER STUDY: I have independently interpreted her arterial Doppler study today.  On the right side she has a biphasic dorsalis pedis and posterior tibial signal.  ABIs 73%.  Toe pressure 78 mmHg.  On the left side there is a biphasic dorsalis pedis and posterior tibial signal.  ABIs 87%.  Toe pressures 54.  ARTERIAL DUPLEX: I have independently interpreted her arterial duplex scan today.  There is biphasic flow throughout the left lower extremity.  There is evidence of a greater than 75% stenosis in the distal left SFA.  [name] [facility] [phone]

## 2021-02-17 ENCOUNTER — Other Ambulatory Visit: Payer: Medicare Other

## 2021-03-19 ENCOUNTER — Other Ambulatory Visit: Payer: Self-pay | Admitting: Physician Assistant

## 2021-03-19 DIAGNOSIS — I739 Peripheral vascular disease, unspecified: Secondary | ICD-10-CM

## 2021-04-28 ENCOUNTER — Encounter: Payer: Self-pay | Admitting: *Deleted

## 2021-04-28 ENCOUNTER — Telehealth: Payer: Self-pay | Admitting: Cardiovascular Disease

## 2021-04-28 DIAGNOSIS — R002 Palpitations: Secondary | ICD-10-CM

## 2021-04-28 DIAGNOSIS — E785 Hyperlipidemia, unspecified: Secondary | ICD-10-CM

## 2021-04-28 NOTE — Telephone Encounter (Signed)
Patient stated her heart has been racing when standing. Patient stated this happen about a week ago. Patient stated she is not having any other symptoms. Patient checked BP 152/77 HR 62. Encouraged patient to keep hydrated. Patient has not been seen since 10/2019. Patient would like an appointment before she goes to visit her son for a month out of town near [employer] on 05/09/21 Informed patient there are no openings before she leaves. Consulted DOD, Dr. Irish Lack, who recommend wearing a heart monitor. Patient verbalized understanding.

## 2021-04-28 NOTE — Progress Notes (Signed)
Patient ID: Sharon Mcdaniel, female   DOB: 1936/08/31, 85 y.o.   MRN: [id number] Patient enrolled for [employer] to ship a 30 day cardiac event monitor to address on file.  Letter with instructions mailed to patient.

## 2021-04-28 NOTE — Telephone Encounter (Signed)
Patient c/o Palpitations:  High priority if patient c/o lightheadedness, shortness of breath, or chest pain  How long have you had palpitations/irregular HR/ Afib? Are you having the symptoms now? No (about a week)  Are you currently experiencing lightheadedness, SOB or CP? no  Do you have a history of afib (atrial fibrillation) or irregular heart rhythm? no  Have you checked your BP or HR? (document readings if available): yes bp 169/68.Marland Kitchen no heart rate reading   Are you experiencing any other symptoms? No    when patient stands up her heart is racing intermittently

## 2021-05-06 ENCOUNTER — Telehealth: Payer: Self-pay

## 2021-05-06 ENCOUNTER — Other Ambulatory Visit: Payer: Self-pay

## 2021-05-06 ENCOUNTER — Other Ambulatory Visit: Payer: Medicare Other

## 2021-05-06 ENCOUNTER — Ambulatory Visit (INDEPENDENT_AMBULATORY_CARE_PROVIDER_SITE_OTHER): Payer: Medicare Other

## 2021-05-06 DIAGNOSIS — R002 Palpitations: Secondary | ICD-10-CM

## 2021-05-06 DIAGNOSIS — E785 Hyperlipidemia, unspecified: Secondary | ICD-10-CM

## 2021-05-06 DIAGNOSIS — R531 Weakness: Secondary | ICD-10-CM

## 2021-05-06 LAB — CBC WITH DIFFERENTIAL/PLATELET
Basophils Absolute: 0.1 10*3/uL (ref 0.0–0.2)
Basos: 1 %
EOS (ABSOLUTE): 0.1 10*3/uL (ref 0.0–0.4)
Eos: 1 %
Hematocrit: 42.3 % (ref 34.0–46.6)
Hemoglobin: 14.1 g/dL (ref 11.1–15.9)
Immature Grans (Abs): 0 10*3/uL (ref 0.0–0.1)
Immature Granulocytes: 0 %
Lymphocytes Absolute: 1.5 10*3/uL (ref 0.7–3.1)
Lymphs: 17 %
MCH: 28.6 pg (ref 26.6–33.0)
MCHC: 33.3 g/dL (ref 31.5–35.7)
MCV: 86 fL (ref 79–97)
Monocytes Absolute: 1 10*3/uL — ABNORMAL HIGH (ref 0.1–0.9)
Monocytes: 11 %
Neutrophils Absolute: 6 10*3/uL (ref 1.4–7.0)
Neutrophils: 70 %
Platelets: 152 10*3/uL (ref 150–450)
RBC: 4.93 x10E6/uL (ref 3.77–5.28)
RDW: 13.4 % (ref 11.7–15.4)
WBC: 8.7 10*3/uL (ref 3.4–10.8)

## 2021-05-06 LAB — COMPREHENSIVE METABOLIC PANEL
ALT: 12 IU/L (ref 0–32)
AST: 21 IU/L (ref 0–40)
Albumin/Globulin Ratio: 2.1 (ref 1.2–2.2)
Albumin: 4.1 g/dL (ref 3.6–4.6)
Alkaline Phosphatase: 114 IU/L (ref 44–121)
BUN/Creatinine Ratio: 17 (ref 12–28)
BUN: 14 mg/dL (ref 8–27)
Bilirubin Total: 0.3 mg/dL (ref 0.0–1.2)
CO2: 23 mmol/L (ref 20–29)
Calcium: 8.8 mg/dL (ref 8.7–10.3)
Chloride: 101 mmol/L (ref 96–106)
Creatinine, Ser: 0.81 mg/dL (ref 0.57–1.00)
Globulin, Total: 2 g/dL (ref 1.5–4.5)
Glucose: 90 mg/dL (ref 65–99)
Potassium: 4.7 mmol/L (ref 3.5–5.2)
Sodium: 137 mmol/L (ref 134–144)
Total Protein: 6.1 g/dL (ref 6.0–8.5)
eGFR: 72 mL/min/{1.73_m2} (ref >59)

## 2021-05-06 LAB — TSH: TSH: 1.87 u[IU]/mL (ref 0.450–4.500)

## 2021-05-06 NOTE — Telephone Encounter (Signed)
Per Dr. Johnsie Cancel patient was having palpitations and weakness per Daughter. She needs a CBC/TSH/CMP. Can you see about getting her in to see DOD or PA in the next week or two.  Called patient. She is scheduled to see Dr. Johnsie Cancel on 05/19/21 on his DOD day. Patient seeing PCP tomorrow. Patient getting lab work today at the office.

## 2021-05-06 NOTE — Addendum Note (Signed)
Addended by: Aris Georgia, Shaquera Ansley L on: 05/06/2021 12:26 PM   Modules accepted: Orders

## 2021-05-10 NOTE — Progress Notes (Signed)
Cardiology Office Note   Date:  05/19/2021   ID:  Sharon Mcdaniel, DOB 1936-08-07, MRN [id number]  PCP:  Hayden Rasmussen, MD  Cardiologist:   Jenkins Rouge, MD   No chief complaint on file.     History of Present Illness: Sharon Mcdaniel is a 85 y.o. female f/u for carotid bruits. Had some facial paresthesias with negative TIA w/u Negative. Last carotid 08/07/19 plaque no stenosis TTE in 2017 normal just mild MR On statin for HLD Sees Dr Melvyn Novas for Gold 32 COPD Quit smoking in 2005 Significant arthritis in right hand   She is my wife's stepmother;s mom Granddaughter Andee Poles moved to [place] from [place] With twin boys Arnell Sieving and Juanda Crumble   Daughter called office end of June  concerned with memory, fatigue and more palpitations  Labs 7/5 with normal TSH, normal lytes low TP and albumin Hct stable 31.7 and elevated WBC 15 neutrophil predominant Cardiac monitor applied  04/28/21  ECG 05/19/2021 SR rate 59 normal no acute changes or arrhythmia  Still wearing monitor Not postural in office today  Symptoms seem more neurological with poor memory ? Tremor hands and worse balance     Past Medical History:  Diagnosis Date   COPD (chronic obstructive pulmonary disease) ([place])    Peripheral vascular disease ([place])     Past Surgical History:  Procedure Laterality Date   ABDOMINAL HYSTERECTOMY  1980   BACK SURGERY  07/2011   synovial cyst removal   INTRAOPERATIVE ARTERIOGRAM Right 04/28/2020   Procedure: INTRA OPERATIVE ARTERIOGRAM;  Surgeon: Angelia Mould, MD;  Location: [facility];  Service: Vascular;  Laterality: Right;   THROMBECTOMY OF BYPASS GRAFT FEMORAL- POPLITEAL ARTERY Right 04/28/2020   Procedure: RIGHT FEMORAL-BELOW KNEE POPLITEAL BYPASS GRAFT USING NON-REVERSED GREATER SAPHENOUS VEIN GRAFT HARVESTED FROM RIGHT UPPER LEG. ;  Surgeon: Angelia Mould, MD;  Location: [facility] [place];  Service: Vascular;  Laterality: Right;   TUBAL LIGATION  1970     Current Outpatient  Medications  Medication Sig Dispense Refill   albuterol (ACCUNEB) 1.25 MG/3ML nebulizer solution Take 3 mLs (1.25 mg total) by nebulization 2 (two) times daily. 180 mL 12   Aspirin 81 MG CAPS Take 81 mg by mouth daily.     atorvastatin (LIPITOR) 40 MG tablet Take 1 tablet (40 mg total) by mouth at bedtime. 90 tablet 3   Azelastine HCl 0.15 % SOLN Place into both nostrils.     budesonide (PULMICORT) 0.25 MG/2ML nebulizer solution Take 2 mLs (0.25 mg total) by nebulization 2 (two) times daily. 120 mL 12   clonazePAM (KLONOPIN) 0.5 MG tablet Take 0.5 mg by mouth daily as needed for anxiety.      diphenhydrAMINE (BENADRYL) 50 MG tablet Take 50 mg by mouth as needed for allergies (bee stings).      donepezil (ARICEPT) 5 MG tablet Take 5 mg by mouth daily.     estradiol (ESTRACE) 0.1 MG/GM vaginal cream Place vaginally.     ibuprofen (ADVIL) 200 MG tablet Take 400 mg by mouth every 6 (six) hours as needed for moderate pain.     ipratropium (ATROVENT) 0.02 % nebulizer solution Take 2.5 mLs (0.5 mg total) by nebulization 2 (two) times daily. 150 mL 12   levothyroxine (SYNTHROID, LEVOTHROID) 112 MCG tablet Take 112 mcg by mouth daily.     losartan (COZAAR) 25 MG tablet Take 25 mg by mouth daily.     mirtazapine (REMERON) 15 MG tablet Take 15 mg by  mouth at bedtime.     nystatin cream (MYCOSTATIN) Apply topically.     predniSONE (DELTASONE) 20 MG tablet 2 tabs for 2 days,  1 tabs for 2 days, then 1/2 tab for 2 days, then stop 8 tablet 1   triamcinolone (KENALOG) 0.1 % Apply topically.     Vitamin D, Ergocalciferol, (DRISDOL) 1.25 MG (50000 UNIT) CAPS capsule Take 50,000 Units by mouth once a week.     No current facility-administered medications for this visit.    Allergies:   Betadine [povidone iodine], Fluconazole in dextrose, Gatifloxacin, Iodinated diagnostic agents, Ioxaglate, Quinolones, Tequin, Zithromax [azithromycin], and Codeine    Social History:  The patient  reports that she quit  smoking about 17 years ago. She has a 30.00 pack-year smoking history. She has never used smokeless tobacco. She reports that she does not drink alcohol and does not use drugs.   Family History:  The patient's family history includes Asthma in her father; Heart disease in her father; Lung cancer in her mother; Rheum arthritis in her mother.    ROS:  Please see the history of present illness.   Otherwise, review of systems are positive for none.   All other systems are reviewed and negative.    PHYSICAL EXAM: VS:  BP 130/72   Pulse (!) 59   Ht 5\' 2"  (1.575 m)   Wt 74.2 kg   SpO2 98%   BMI 29.92 kg/m  , BMI Body mass index is 29.92 kg/m. Affect appropriate Healthy:  appears stated age 53: normal Neck supple with no adenopathy JVP normal left bruits no thyromegaly Lungs clear with no wheezing and good diaphragmatic motion Heart:  S1/S2 no murmur, no rub, gallop or click PMI normal Abdomen: benighn, BS positve, no tenderness, no AAA no bruit.  No HSM or HJR Post right fem-pop bypass  Plus one bilateral edema worse on left with varicosities  Neuro non-focal Skin warm and dry Arthritis in hands right worse      EKG:   NSR mild LAE normal 10/06/17 NSR rate 58 LAE normal 10/20/18 SR rate 59 LAE otherwise normal  10/23/19 SR rate 71 normal   Recent Labs: 05/06/2021: ALT 12; BUN 14; Creatinine, Ser 0.81; Hemoglobin 14.1; Platelets 152; Potassium 4.7; Sodium 137; TSH 1.870    Lipid Panel    Component Value Date/Time   CHOL 119 04/29/2020 0312   CHOL 144 10/05/2017 1534   TRIG 51 04/29/2020 0312   HDL 67 04/29/2020 0312   HDL 87 10/05/2017 1534   CHOLHDL 1.8 04/29/2020 0312   VLDL 10 04/29/2020 0312   LDLCALC 42 04/29/2020 0312   LDLCALC 39 10/05/2017 1534      Wt Readings from Last 3 Encounters:  05/19/21 74.2 kg  02/12/21 72.6 kg  01/30/21 72.6 kg      Other studies Reviewed: Additional studies/ records that were reviewed today include: Notes Dr Einar Gip Echo and  duplex .Carotid duplex 08/07/19     ASSESSMENT AND PLAN:  1.  Facial Paresthesia etiology not clear but low likelyhood of TIA doubt PAF resolved 2.  Left Carotid Bruit:  Duplex 08/07/19 plaque no stenosis will update  3. Thyroid  Euthyroid continue current replacement dose TSH normal 05/06/21 4. GERD low carb diet pepcid  5. Varicose Veins:  Left [place] no DVT 09/30/16 f/u [facility]  6. COPD:  F/u [facility]ert CXR 01/30/21 COPD no acute findings  7. HLD:  Continue statin labs with primary LDL 39 10/05/17   8. Palpitations: benign  has monitor on ECG fine today  9. PVD: post right fem-pop bypass with SFA disease 75-99% on left duplex 02/12/21 F/U Dr Scot Dock Deferred intervention due to lack of symptoms and age   Current medicines are reviewed at length with the patient today.  The patient does not have concerns regarding medicines.  The following changes have been made:  no change  Labs/ tests ordered today include: Carotid   No orders of the defined types were placed in this encounter.    Disposition:   FU with me in a year      Signed, Jenkins Rouge, MD  05/19/2021 10:53 AM    [facility] [address], [place], [place]  [address] Phone: [phone]; Fax: [phone]

## 2021-05-18 ENCOUNTER — Encounter: Payer: Self-pay | Admitting: Cardiovascular Disease

## 2021-05-19 ENCOUNTER — Encounter: Payer: Self-pay | Admitting: Cardiovascular Disease

## 2021-05-19 ENCOUNTER — Ambulatory Visit: Payer: Medicare Other | Admitting: Cardiovascular Disease

## 2021-05-19 ENCOUNTER — Other Ambulatory Visit: Payer: Self-pay

## 2021-05-19 VITALS — BP 130/72 | HR 59 | Ht 62.0 in | Wt 163.6 lb

## 2021-05-19 DIAGNOSIS — R002 Palpitations: Secondary | ICD-10-CM | POA: Diagnosis not present

## 2021-05-19 DIAGNOSIS — E782 Mixed hyperlipidemia: Secondary | ICD-10-CM

## 2021-05-19 DIAGNOSIS — R0989 Other specified symptoms and signs involving the circulatory and respiratory systems: Secondary | ICD-10-CM | POA: Diagnosis not present

## 2021-05-19 NOTE — Patient Instructions (Signed)
Medication Instructions:  Your physician recommends that you continue on your current medications as directed. Please refer to the Current Medication list given to you today.  *If you need a refill on your cardiac medications before your next appointment, please call your pharmacy*  Lab Work: If you have labs (blood work) drawn today and your tests are completely normal, you will receive your results only by: [place] (if you have MyChart) OR A paper copy in the mail If you have any lab test that is abnormal or we need to change your treatment, we will call you to review the results.  Testing/Procedures: Your physician has requested that you have a carotid duplex. This test is an ultrasound of the carotid arteries in your neck. It looks at blood flow through these arteries that supply the brain with blood. Allow one hour for this exam. There are no restrictions or special instructions.  Follow-Up: At [facility], you and your health needs are our priority.  As part of our continuing mission to provide you with exceptional heart care, we have created designated Provider Care Teams.  These Care Teams include your primary Cardiologist (physician) and Advanced Practice Providers (APPs -  Physician Assistants and Nurse Practitioners) who all work together to provide you with the care you need, when you need it.  We recommend signing up for the patient portal called "MyChart".  Sign up information is provided on this After Visit Summary.  MyChart is used to connect with patients for Virtual Visits (Telemedicine).  Patients are able to view lab/test results, encounter notes, upcoming appointments, etc.  Non-urgent messages can be sent to your provider as well.   To learn more about what you can do with MyChart, go to [email or web address].    Your next appointment:   12 month(s)  The format for your next appointment:   In Person  Provider:   You may see Jenkins Rouge, MD or one of the  following Advanced Practice Providers on your designated Care Team:   Cecilie Kicks, NP

## 2021-05-30 ENCOUNTER — Ambulatory Visit: Payer: Medicare Other | Admitting: Cardiovascular Disease

## 2021-06-24 ENCOUNTER — Encounter (HOSPITAL_COMMUNITY): Payer: Medicare Other

## 2021-06-30 ENCOUNTER — Encounter (HOSPITAL_COMMUNITY): Payer: Medicare Other

## 2021-07-01 ENCOUNTER — Ambulatory Visit: Payer: Medicare Other | Admitting: Internal Medicine

## 2021-07-04 ENCOUNTER — Other Ambulatory Visit: Payer: Self-pay

## 2021-07-04 ENCOUNTER — Ambulatory Visit (HOSPITAL_COMMUNITY)
Admission: RE | Admit: 2021-07-04 | Discharge: 2021-07-04 | Disposition: A | Discharge status: Home / Self Care | Payer: Medicare Other | Source: Ambulatory Visit | Attending: Cardiovascular Disease | Admitting: Cardiovascular Disease

## 2021-07-04 DIAGNOSIS — R0989 Other specified symptoms and signs involving the circulatory and respiratory systems: Secondary | ICD-10-CM

## 2021-07-04 DIAGNOSIS — R002 Palpitations: Secondary | ICD-10-CM | POA: Diagnosis not present

## 2021-07-04 DIAGNOSIS — E782 Mixed hyperlipidemia: Secondary | ICD-10-CM | POA: Diagnosis not present

## 2021-07-08 ENCOUNTER — Ambulatory Visit: Payer: Medicare Other | Admitting: Internal Medicine

## 2021-07-10 ENCOUNTER — Ambulatory Visit: Payer: Medicare Other | Admitting: Internal Medicine

## 2021-07-16 ENCOUNTER — Ambulatory Visit: Payer: Medicare Other | Admitting: Internal Medicine

## 2021-07-16 NOTE — Progress Notes (Deleted)
Subjective:     Patient ID: Sharon Mcdaniel, female   DOB: 1936/02/11      MRN: [id number]   Brief patient profile:   28 yowf quit smoking in 2005 with severe CAP dx as GOLD II copd Sept 2007 and referred to pulmonary clinic 06/10/2016 by Dr Kim/ Thedora Hinders with nl pfts 07/2016 p neb c/w AB not copd   History of Present Illness  06/10/2016 1st [facility] office visit/ [place] Capers   Chief Complaint  Patient presents with   Pulmonary Consult    Pt referred by Dr Epimenio Sarin for COPD/lung mass. Recent PET scan 05/11/16.   onset of new pattern in Dec 2016 with severe cough that comes and goes and a persistent sense of doe that persisteed since them not back to baseline esp Worse in  June 2017 but improved p abx Doe = MMRC1 = can walk nl pace, flat grade, can't hurry or go uphills or steps s sob Since onset of recurrent cough has had unresolved nasal congestion and intermittent purulent nasal drainage correlating with flares of "bronchitis' pred / abx def help cough but keeps coming back sev weeks later  rec  schedule sinus CT> neg 06/15/16  GERD  Diet     03/11/2018  f/u ov/Sue Fernicola re:   AB on pulmocort/duoneb each am only Chief Complaint  Patient presents with   Follow-up    Breathing is doing well and not coughing. She uses albuterol, atrovent and budesonide each once per day. No new co's.   Dyspnea:  Housework, steps ok/ dog walking some hills  Cough: no Sleep: well SABA use:  No extra rx rec No change rx   05/10/2019  f/u ov/Merci Walthers re:  AB pulm/duobeb once a twice dialy  Chief Complaint  Patient presents with   Follow-up    Breathing is doing well and no new co's. She is using using her neb at least once per day.   Dyspnea:  yardwork ok, MMRC1 = can walk nl pace, flat grade, can't hurry or go uphills or steps s sob   Cough: none Sleeping: able to lie flat one pillow SABA use: as abov 02: none rec Once daily is fine for neb with pm dose prn   07/01/2020  Yearly f/u ov/Keara Pagliarulo re:  AB  on duoneb/pulmicort 0.25 mg each am only  Chief Complaint  Patient presents with   Follow-up    Breathing is doing well. No complaints.   Dyspnea:  More limited by R leg/ graft for PVD Cough: cough Sleeping: one pillow/ bed flat  SABA use: no extra 02: no 02  Rec No change rx   NP recs as of 01/30/21 Increase Pulmicort /Ipratropium/Albuterol Neb Twice daily   Prednisone '20mg'$  2 tabs daily for 2 days then 1 tab daily for 2 days and 1/2 tab daily for 2 days and stop .  Mucinex DM Twice daily  As needed  Cough/congestion  Cxr: no acute changes   07/16/2021  f/u ov/Amunique Neyra re: AB   maint on ***  No chief complaint on file.   Dyspnea:  *** Cough: *** Sleeping: *** SABA use: *** 02: *** Covid status:   ***   No obvious day to day or daytime variability or assoc excess/ purulent sputum or mucus plugs or hemoptysis or cp or chest tightness, subjective wheeze or overt sinus or hb symptoms.   *** without nocturnal  or early am exacerbation  of respiratory  c/o's or need for noct saba. Also  denies any obvious fluctuation of symptoms with weather or environmental changes or other aggravating or alleviating factors except as outlined above   No unusual exposure hx or h/o childhood pna/ asthma or knowledge of premature birth.  Current Allergies, Complete Past Medical History, Past Surgical History, Family History, and Social History were reviewed in [employer] record.  ROS  The following are not active complaints unless bolded Hoarseness, sore throat, dysphagia, dental problems, itching, sneezing,  nasal congestion or discharge of excess mucus or purulent secretions, ear ache,   fever, chills, sweats, unintended wt loss or wt gain, classically pleuritic or exertional cp,  orthopnea pnd or arm/hand swelling  or leg swelling, presyncope, palpitations, abdominal pain, anorexia, nausea, vomiting, diarrhea  or change in bowel habits or change in bladder habits, change in  stools or change in urine, dysuria, hematuria,  rash, arthralgias, visual complaints, headache, numbness, weakness or ataxia or problems with walking or coordination,  change in mood or  memory.        No outpatient medications have been marked as taking for the 07/16/21 encounter (Appointment) with Tanda Rockers, MD.                        Objective:   Physical Exam     07/16/2021        ***  07/01/2020        161 05/10/2019          149  03/11/2018        139  03/10/2017          152   08/14/2016     157   07/08/16 161 lb 12.8 oz (73.4 kg)  06/10/16 164 lb (74.4 kg)  11/03/11 145 lb 8 oz (66 kg)       Report: full dentures    Vital signs reviewed  07/16/2021  - Note at rest 02 sats  ***% on ***   General appearance:    ***                   Assessment:

## 2021-08-05 ENCOUNTER — Other Ambulatory Visit: Payer: Self-pay | Admitting: Physician Assistant

## 2021-08-06 ENCOUNTER — Ambulatory Visit: Payer: Medicare Other | Admitting: Cardiovascular Disease

## 2021-08-08 ENCOUNTER — Ambulatory Visit: Payer: Medicare Other | Admitting: Internal Medicine

## 2021-08-08 ENCOUNTER — Other Ambulatory Visit: Payer: Self-pay

## 2021-08-08 ENCOUNTER — Encounter: Payer: Self-pay | Admitting: Internal Medicine

## 2021-08-08 VITALS — BP 118/60 | HR 71 | Temp 97.9°F | Ht 62.0 in | Wt 160.6 lb

## 2021-08-08 DIAGNOSIS — Z23 Encounter for immunization: Secondary | ICD-10-CM | POA: Diagnosis not present

## 2021-08-08 DIAGNOSIS — J449 Chronic obstructive pulmonary disease, unspecified: Secondary | ICD-10-CM | POA: Diagnosis not present

## 2021-08-08 NOTE — Progress Notes (Signed)
Subjective:     Patient ID: Sharon Mcdaniel, female   DOB: 02/12/1936      MRN: [id number]   Brief patient profile:   76  yowf quit smoking in 2005 with severe CAP dx as GOLD II copd Sept 2007 and referred to pulmonary clinic 06/10/2016 by Dr Kim/ Thedora Hinders with nl pfts 07/2016 p neb c/w AB not copd   History of Present Illness  06/10/2016 1st [facility] office visit/ Sharon Mcdaniel   Chief Complaint  Patient presents with   Pulmonary Consult    Pt referred by Dr Epimenio Sarin for COPD/lung mass. Recent PET scan 05/11/16.   onset of new pattern in Dec 2016 with severe cough that comes and goes and a persistent sense of doe that persisteed since them not back to baseline esp Worse in  June 2017 but improved p abx Doe = MMRC1 = can walk nl pace, flat grade, can't hurry or go uphills or steps s sob Since onset of recurrent cough has had unresolved nasal congestion and intermittent purulent nasal drainage correlating with flares of "bronchitis' pred / abx def help cough but keeps coming back sev weeks later  rec  schedule sinus CT> neg 06/15/16  GERD  Diet     03/11/2018  f/u ov/Sharon Mcdaniel re:   AB on pulmocort/duoneb each am only Chief Complaint  Patient presents with   Follow-up    Breathing is doing well and not coughing. She uses albuterol, atrovent and budesonide each once per day. No new co's.   Dyspnea:  Housework, steps ok/ dog walking some hills  Cough: no Sleep: well SABA use:  No extra rx rec No change rx   05/10/2019  f/u ov/Sharon Mcdaniel re:  AB pulm/duobeb once a twice dialy  Chief Complaint  Patient presents with   Follow-up    Breathing is doing well and no new co's. She is using using her neb at least once per day.   Dyspnea:  yardwork ok, MMRC1 = can walk nl pace, flat grade, can't hurry or go uphills or steps s sob   Cough: none Sleeping: able to lie flat one pillow SABA use: as abov 02: none rec Once daily is fine for neb with pm dose prn   07/01/2020  Yearly f/u ov/Sharon Mcdaniel re:   AB on duoneb/pulmicort 0.25 mg each am only  Chief Complaint  Patient presents with   Follow-up    Breathing is doing well. No complaints.   Dyspnea:  More limited by R leg/ graft for PVD Cough: cough Sleeping: one pillow/ bed flat  SABA use: no extra 02: no 02  Rec No change rx   NP recs as of 01/30/21 Increase Pulmicort /Ipratropium/Albuterol Neb Twice daily   Prednisone 20mg  2 tabs daily for 2 days then 1 tab daily for 2 days and 1/2 tab daily for 2 days and stop .  Mucinex DM Twice daily  As needed  Cough/congestion  Cxr: no acute changes   08/08/2021  f/u ov/Sharon Mcdaniel re: AB   maint on duoneb /pulmicort  bid  Chief Complaint  Patient presents with   Follow-up    Breathing is overall doing well. She c/o nasal congestion and also runny nose.   Dyspnea:  limited by R leg/graft PVD  Cough: none  Sleeping: one pillow flat bed SABA use: no extra duoneb needed  02: none  Covid status:  covid x 3 vaccines    No obvious day to day or daytime variability or assoc excess/  purulent sputum or mucus plugs or hemoptysis or cp or chest tightness, subjective wheeze or overt sinus or hb symptoms.   Sleeping as above without nocturnal  or early am exacerbation  of respiratory  c/o's or need for noct saba. Also denies any obvious fluctuation of symptoms with weather or environmental changes or other aggravating or alleviating factors except as outlined above   No unusual exposure hx or h/o childhood pna/ asthma or knowledge of premature birth.  Current Allergies, Complete Past Medical History, Past Surgical History, Family History, and Social History were reviewed in [employer] record.  ROS  The following are not active complaints unless bolded Hoarseness, sore throat, dysphagia, dental problems, itching, sneezing,  nasal congestion or discharge of excess mucus or purulent secretions, ear ache,   fever, chills, sweats, unintended wt loss or wt gain, classically pleuritic  or exertional cp,  orthopnea pnd or arm/hand swelling  or leg swelling, presyncope, palpitations, abdominal pain, anorexia, nausea, vomiting, diarrhea  or change in bowel habits or change in bladder habits, change in stools or change in urine, dysuria, hematuria,  rash, arthralgias, visual complaints, headache, numbness, weakness or ataxia or problems with walking or coordination,  change in mood or  memory.        Current Meds  Medication Sig   albuterol (ACCUNEB) 1.25 MG/3ML nebulizer solution Take 3 mLs (1.25 mg total) by nebulization 2 (two) times daily.   Aspirin 81 MG CAPS Take 81 mg by mouth daily.   atorvastatin (LIPITOR) 40 MG tablet TAKE 1 TABLET BY MOUTH DAILY AT BEDTIME   Azelastine HCl 0.15 % SOLN Place into both nostrils.   budesonide (PULMICORT) 0.25 MG/2ML nebulizer solution Take 2 mLs (0.25 mg total) by nebulization 2 (two) times daily.   clonazePAM (KLONOPIN) 0.5 MG tablet Take 0.5 mg by mouth daily as needed for anxiety.    diphenhydrAMINE (BENADRYL) 50 MG tablet Take 50 mg by mouth as needed for allergies (bee stings).    donepezil (ARICEPT) 5 MG tablet Take 5 mg by mouth daily.   estradiol (ESTRACE) 0.1 MG/GM vaginal cream Place vaginally.   ibuprofen (ADVIL) 200 MG tablet Take 400 mg by mouth every 6 (six) hours as needed for moderate pain.   ipratropium (ATROVENT) 0.02 % nebulizer solution Take 2.5 mLs (0.5 mg total) by nebulization 2 (two) times daily.   levothyroxine (SYNTHROID, LEVOTHROID) 112 MCG tablet Take 112 mcg by mouth daily.   losartan (COZAAR) 25 MG tablet Take 25 mg by mouth daily.   mirtazapine (REMERON) 15 MG tablet Take 15 mg by mouth at bedtime.   nystatin cream (MYCOSTATIN) Apply topically.   triamcinolone (KENALOG) 0.1 % Apply topically.   Vitamin D, Ergocalciferol, (DRISDOL) 1.25 MG (50000 UNIT) CAPS capsule Take 50,000 Units by mouth once a week.                        Objective:   Physical Exam     08/08/2021        160  07/01/2020         161 05/10/2019          149  03/11/2018        139  03/10/2017          152   08/14/2016     157   07/08/16 161 lb 12.8 oz (73.4 kg)  06/10/16 164 lb (74.4 kg)  11/03/11 145 lb 8 oz (66 kg)  Report: full dentures    Vital signs reviewed  08/08/2021  - Note at rest 02 sats  98% on RA   General appearance:    pleasant amb wf nad   HEENT : pt wearing mask not removed for exam due to covid -19 concerns.    NECK :  without JVD/Nodes/TM/ nl carotid upstrokes bilaterally   LUNGS: no acc muscle use,  Nl contour chest which is clear to A and P bilaterally without cough on insp or exp maneuvers   CV:  RRR  no s3 or murmur or increase in P2, and no edema   ABD:  soft and nontender with nl inspiratory excursion in the supine position. No bruits or organomegaly appreciated, bowel sounds nl  MS:  Nl gait/ ext warm without deformities, calf tenderness, cyanosis or clubbing No obvious joint restrictions   SKIN: warm and dry without lesions    NEURO:  alert, approp, nl sensorium with  no motor or cerebellar deficits apparent.                    Assessment:

## 2021-08-08 NOTE — Assessment & Plan Note (Addendum)
Quit smoking 2005 PFTs July 06, 2006   FEV1 of 58% predicted with a ratio of 56%  And 15% improvement after bronchodilator consistent with an asthmatic component. Allergy profile 06/10/2016 >  Eos 0.1/  IgE 7 RAST neg  - Sinus CT 06/15/2016 > neg    - PFT's  07/08/2016  FEV1 1.45 (79 % ) ratio 74  p 10 % improvement from saba p duoneb > 6 h  prior to study with DLCO  52 % corrects to 71  % for alv volume    All goals of chronic asthma control met including optimal function and elimination of symptoms with minimal need for rescue therapy.  Contingencies discussed in full including contacting this office immediately if not controlling the symptoms using the rule of two's.     F/u yearly and prn in meantime          Each maintenance medication was reviewed in detail including emphasizing most importantly the difference between maintenance and prns and under what circumstances the prns are to be triggered using an action plan format where appropriate.  Total time for H and P, chart review, counseling,  and generating customized AVS unique to this office visit / same day charting = 25 min

## 2021-08-08 NOTE — Patient Instructions (Signed)
Flu shot today   No change in medications  Please schedule a follow up visit in 12  months but call sooner if needed

## 2021-10-30 ENCOUNTER — Other Ambulatory Visit: Payer: Self-pay | Admitting: Family Medicine

## 2021-10-30 ENCOUNTER — Telehealth: Payer: Self-pay | Admitting: Internal Medicine

## 2021-10-30 DIAGNOSIS — Z1231 Encounter for screening mammogram for malignant neoplasm of breast: Secondary | ICD-10-CM

## 2021-10-30 NOTE — Telephone Encounter (Signed)
Called and spoke with pt who states she has been sick for 2 weeks. Pt saw PCP 1 week ago and was put on prednisone and doxy 12/23.  Pt said that she still has been having complaints of congestion in her head as well as in her chest. States that she has been coughing up green phlegm and also has green postnasal drainage.  Pt denies any complaints of fever as her temp this morning was 98.2  Pt has been using her nebulizer twice a day as prescribed.  Due to pt not any better after being started on prednisone and doxy by PCP, pt wants to know if there is anything else we could recommend. Dr. Melvyn Novas, please advise.

## 2021-10-30 NOTE — Telephone Encounter (Signed)
Called and spoke to pt. Informed her of the recs per Dr. Wert. Pt verbalized understanding and denied any further questions or concerns at this time.   

## 2021-10-30 NOTE — Telephone Encounter (Signed)
For cough/ congestion:  mucinex dm 1200 mg every 12 hours   For nasal stuffiness Advil cold and sinus   For breathing difficulty ok to use extra albuterol (not combined with budesonide) up to every 4 hours or 6 x in 24 h if needed,  2 of which she combines with the budesonide as she was before.  If not improving needs to see PCP or [place] next week, if getting worse in meantime: go to ER

## 2021-11-21 ENCOUNTER — Ambulatory Visit: Payer: Medicare Other

## 2021-11-26 DIAGNOSIS — M533 Sacrococcygeal disorders, not elsewhere classified: Secondary | ICD-10-CM | POA: Insufficient documentation

## 2022-01-01 ENCOUNTER — Ambulatory Visit
Admission: RE | Admit: 2022-01-01 | Discharge: 2022-01-01 | Disposition: A | Discharge status: Home / Self Care | Payer: Medicare Other | Source: Ambulatory Visit | Attending: Family Medicine | Admitting: Family Medicine

## 2022-01-01 DIAGNOSIS — Z1231 Encounter for screening mammogram for malignant neoplasm of breast: Secondary | ICD-10-CM

## 2022-02-02 ENCOUNTER — Other Ambulatory Visit: Payer: Self-pay

## 2022-02-02 DIAGNOSIS — Z780 Asymptomatic menopausal state: Secondary | ICD-10-CM | POA: Insufficient documentation

## 2022-02-02 DIAGNOSIS — M1990 Unspecified osteoarthritis, unspecified site: Secondary | ICD-10-CM | POA: Insufficient documentation

## 2022-02-02 DIAGNOSIS — W57XXXA Bitten or stung by nonvenomous insect and other nonvenomous arthropods, initial encounter: Secondary | ICD-10-CM | POA: Insufficient documentation

## 2022-02-02 DIAGNOSIS — Z0000 Encounter for general adult medical examination without abnormal findings: Secondary | ICD-10-CM | POA: Insufficient documentation

## 2022-02-02 DIAGNOSIS — I739 Peripheral vascular disease, unspecified: Secondary | ICD-10-CM

## 2022-02-02 DIAGNOSIS — R209 Unspecified disturbances of skin sensation: Secondary | ICD-10-CM | POA: Insufficient documentation

## 2022-02-02 DIAGNOSIS — J8410 Pulmonary fibrosis, unspecified: Secondary | ICD-10-CM | POA: Insufficient documentation

## 2022-02-02 DIAGNOSIS — R918 Other nonspecific abnormal finding of lung field: Secondary | ICD-10-CM | POA: Insufficient documentation

## 2022-02-02 DIAGNOSIS — J301 Allergic rhinitis due to pollen: Secondary | ICD-10-CM | POA: Insufficient documentation

## 2022-02-12 ENCOUNTER — Ambulatory Visit (INDEPENDENT_AMBULATORY_CARE_PROVIDER_SITE_OTHER)
Admission: RE | Admit: 2022-02-12 | Discharge: 2022-02-12 | Disposition: A | Discharge status: Home / Self Care | Payer: Medicare Other | Source: Ambulatory Visit | Attending: Vascular Surgery | Admitting: Vascular Surgery

## 2022-02-12 ENCOUNTER — Ambulatory Visit: Payer: Medicare Other | Admitting: Vascular Surgery

## 2022-02-12 ENCOUNTER — Encounter: Payer: Self-pay | Admitting: Vascular Surgery

## 2022-02-12 ENCOUNTER — Ambulatory Visit (HOSPITAL_COMMUNITY)
Admission: RE | Admit: 2022-02-12 | Discharge: 2022-02-12 | Disposition: A | Discharge status: Home / Self Care | Payer: Medicare Other | Source: Ambulatory Visit | Attending: Vascular Surgery | Admitting: Vascular Surgery

## 2022-02-12 VITALS — BP 180/81 | HR 59 | Temp 97.7°F | Resp 20 | Ht 62.0 in | Wt 173.0 lb

## 2022-02-12 DIAGNOSIS — Z48812 Encounter for surgical aftercare following surgery on the circulatory system: Secondary | ICD-10-CM

## 2022-02-12 DIAGNOSIS — I739 Peripheral vascular disease, unspecified: Secondary | ICD-10-CM | POA: Diagnosis present

## 2022-02-12 NOTE — Progress Notes (Signed)
? ? ?REASON FOR VISIT:  ? ?Follow-up of right Pham below-knee pop bypass with vein ? ?MEDICAL ISSUES:  ? ?S/P RIGHT FEMORAL BELOW-KNEE POP BYPASS (VEIN): Her bypass graft is widely patent.  She has known tibial artery occlusive disease.  She is asymptomatic.  She is not a smoker.  She is on aspirin and is on a statin.  She will need a follow-up duplex in 1 year and ABIs at that time.  I ordered this test.  She can be seen on the PA schedule at that time.  She knows to call sooner if she has problems. ? ?HPI:  ? ?Sharon Mcdaniel is a pleasant 86 y.o. female who I last saw on 02/12/2021.  She underwent a right femoropopliteal bypass graft on 04/28/2020.  This was a right femoral to below-knee popliteal artery bypass with vein.  She has severe tibial artery occlusive disease bilaterally.  She comes in for a 1 year follow-up visit. ? ?Since I saw her last, she denies any history of claudication or rest pain.  She does get some tingling in her feet at night.  She has no history of nonhealing wounds. ? ?She is not a smoker.  She is on aspirin and is on a statin. ? ?Past Medical History:  ?Diagnosis Date  ? COPD (chronic obstructive pulmonary disease) ([place])   ? Peripheral vascular disease ([place])   ? ? ?Family History  ?Problem Relation Age of Onset  ? Lung cancer Mother   ? Rheum arthritis Mother   ? Asthma Father   ? Heart disease Father   ? ? ?SOCIAL HISTORY: ?Social History  ? ?Tobacco Use  ? Smoking status: Former  ?  Packs/day: 1.00  ?  Years: 30.00  ?  Pack years: 30.00  ?  Types: Cigarettes  ?  Quit date: 11/03/2003  ?  Years since quitting: 18.2  ? Smokeless tobacco: Never  ?Substance Use Topics  ? Alcohol use: No  ? ? ?Allergies  ?Allergen Reactions  ? Betadine [Povidone Iodine] Rash  ? Fluconazole In Dextrose Hives and Shortness Of Breath  ? Gatifloxacin Hives and Shortness Of Breath  ? Iodinated Contrast Media Hives and Shortness Of Breath  ?  States she tolerates betadine  ? Ioxaglate Hives and Shortness Of Breath  ?  Quinolones Hives and Shortness Of Breath  ? Tequin Hives and Shortness Of Breath  ? Zithromax [Azithromycin]   ?  GI Upset  ? Codeine Other (See Comments)  ?  Makes her jittery ?  ? ? ?Current Outpatient Medications  ?Medication Sig Dispense Refill  ? albuterol (ACCUNEB) 1.25 MG/3ML nebulizer solution Take 3 mLs (1.25 mg total) by nebulization 2 (two) times daily. 180 mL 12  ? Aspirin 81 MG CAPS Take 81 mg by mouth daily.    ? atorvastatin (LIPITOR) 40 MG tablet TAKE 1 TABLET BY MOUTH DAILY AT BEDTIME 90 tablet 3  ? Azelastine HCl 0.15 % SOLN Place into both nostrils.    ? budesonide (PULMICORT) 0.25 MG/2ML nebulizer solution Take 2 mLs (0.25 mg total) by nebulization 2 (two) times daily. 120 mL 12  ? clonazePAM (KLONOPIN) 0.5 MG tablet Take 0.5 mg by mouth daily as needed for anxiety.     ? diphenhydrAMINE (BENADRYL) 50 MG tablet Take 50 mg by mouth as needed for allergies (bee stings).     ? donepezil (ARICEPT) 5 MG tablet Take 5 mg by mouth daily.    ? estradiol (ESTRACE) 0.1 MG/GM vaginal cream Place vaginally.    ?  ibuprofen (ADVIL) 200 MG tablet Take 400 mg by mouth every 6 (six) hours as needed for moderate pain.    ? ipratropium (ATROVENT) 0.02 % nebulizer solution Take 2.5 mLs (0.5 mg total) by nebulization 2 (two) times daily. 150 mL 12  ? levothyroxine (SYNTHROID, LEVOTHROID) 112 MCG tablet Take 112 mcg by mouth daily.    ? losartan (COZAAR) 25 MG tablet Take 25 mg by mouth daily.    ? mirtazapine (REMERON) 15 MG tablet Take 15 mg by mouth at bedtime.    ? nystatin cream (MYCOSTATIN) Apply topically.    ? predniSONE (DELTASONE) 20 MG tablet 2 tabs for 2 days,  1 tabs for 2 days, then 1/2 tab for 2 days, then stop (Patient not taking: Reported on 08/08/2021) 8 tablet 1  ? triamcinolone (KENALOG) 0.1 % Apply topically.    ? Vitamin D, Ergocalciferol, (DRISDOL) 1.25 MG (50000 UNIT) CAPS capsule Take 50,000 Units by mouth once a week.    ? ?No current facility-administered medications for this visit.   ? ? ?REVIEW OF SYSTEMS:  ?'[X]'$  denotes positive finding, '[ ]'$  denotes negative finding ?Cardiac  Comments:  ?Chest pain or chest pressure:    ?Shortness of breath upon exertion:    ?Short of breath when lying flat:    ?Irregular heart rhythm:    ?    ?Vascular    ?Pain in calf, thigh, or hip brought on by ambulation:    ?Pain in feet at night that wakes you up from your sleep:     ?Blood clot in your veins:    ?Leg swelling:     ?    ?Pulmonary    ?Oxygen at home:    ?Productive cough:     ?Wheezing:     ?    ?Neurologic    ?Sudden weakness in arms or legs:     ?Sudden numbness in arms or legs:     ?Sudden onset of difficulty speaking or slurred speech:    ?Temporary loss of vision in one eye:     ?Problems with dizziness:     ?    ?Gastrointestinal    ?Blood in stool:     ?Vomited blood:     ?    ?Genitourinary    ?Burning when urinating:     ?Blood in urine:    ?    ?Psychiatric    ?Major depression:     ?    ?Hematologic    ?Bleeding problems:    ?Problems with blood clotting too easily:    ?    ?Skin    ?Rashes or ulcers:    ?    ?Constitutional    ?Fever or chills:    ? ?PHYSICAL EXAM:  ? ?Vitals:  ? 02/12/22 1233  ?BP: (!) 180/81  ?Pulse: (!) 59  ?Resp: 20  ?Temp: 97.7 ?F (36.5 ?C)  ?SpO2: 95%  ?Weight: 173 lb (78.5 kg)  ?Height: '5\' 2"'$  (1.575 m)  ? ? ?GENERAL: The patient is a well-nourished female, in no acute distress. The vital signs are documented above. ?CARDIAC: There is a regular rate and rhythm.  ?VASCULAR: She has a palpable femoral pulses. ?I cannot palpate pedal pulses however both feet are warm and well-perfused. ?PULMONARY: There is good air exchange bilaterally without wheezing or rales. ?ABDOMEN: Soft and non-tender with normal pitched bowel sounds.  ?MUSCULOSKELETAL: There are no major deformities or cyanosis. ?NEUROLOGIC: No focal weakness or paresthesias are detected. ?SKIN: There are no ulcers or  rashes noted. ?PSYCHIATRIC: The patient has a normal affect. ? ?DATA:   ? ?ARTERIAL DOPPLER  STUDY: I have independently interpreted her arterial Doppler study today. ? ?On the right side there is a monophasic dorsalis pedis and posterior tibial signal.  ABIs 75%.  Toe pressure is 93 mmHg. ? ?On the left side there is a monophasic posterior tibial signal with a biphasic dorsalis pedis signal.  ABIs 88%.  Toe pressure 63 mmHg. ? ?ARTERIAL DUPLEX: I have independently interpreted her arterial duplex scan today.  Her right femoropopliteal bypass graft is widely patent with biphasic flow throughout and no areas of stenosis noted. ? ? ? ?[name] ?[facility] ?Office [phone] ?

## 2022-02-25 ENCOUNTER — Telehealth: Payer: Self-pay | Admitting: Cardiovascular Disease

## 2022-02-25 ENCOUNTER — Ambulatory Visit (INDEPENDENT_AMBULATORY_CARE_PROVIDER_SITE_OTHER): Payer: Medicare Other

## 2022-02-25 ENCOUNTER — Ambulatory Visit: Payer: Medicare Other | Admitting: Internal Medicine

## 2022-02-25 ENCOUNTER — Encounter: Payer: Self-pay | Admitting: Internal Medicine

## 2022-02-25 ENCOUNTER — Other Ambulatory Visit (HOSPITAL_BASED_OUTPATIENT_CLINIC_OR_DEPARTMENT_OTHER): Payer: Self-pay | Admitting: Family Medicine

## 2022-02-25 DIAGNOSIS — J449 Chronic obstructive pulmonary disease, unspecified: Secondary | ICD-10-CM | POA: Diagnosis not present

## 2022-02-25 NOTE — Telephone Encounter (Signed)
[place] office does not schedule outside practices echos. They go to [place].Our front office staff will forward to  echo scheduling. ?

## 2022-02-25 NOTE — Progress Notes (Signed)
Subjective:  ?  ? Patient ID: Sharon Mcdaniel, female   DOB: 1936-04-24      MRN: [id number] ? ? ?Brief patient profile:   ?64  yowf quit smoking in 2005 with severe CAP dx as GOLD II copd Sept 2007 and referred to pulmonary clinic 06/10/2016 by Dr Kim/ Thedora Hinders with nl pfts 07/2016 p neb c/w AB not copd ? ? ?History of Present Illness  ?06/10/2016 1st [facility] office visit/ Sharon Mcdaniel   ?Chief Complaint  ?Patient presents with  ? Pulmonary Consult  ?  Pt referred by Dr Epimenio Sarin for COPD/lung mass. Recent PET scan 05/11/16.   ?onset of new pattern in Dec 2016 with severe cough that comes and goes and a persistent sense of doe that persisteed since them not back to baseline esp Worse in  June 2017 but improved p abx ?Doe = MMRC1 = can walk nl pace, flat grade, can't hurry or go uphills or steps s sob ?Since onset of recurrent cough has had unresolved nasal congestion and intermittent purulent nasal drainage correlating with flares of "bronchitis' ?pred / abx def help cough but keeps coming back sev weeks later  ?rec ? schedule sinus CT> neg 06/15/16  ?GERD  Diet ? ? ? ?02/25/2022  f/u ov/Sharon Mcdaniel re: AB   maint on duoneb / pulmicort  bid  ?Chief Complaint  ?Patient presents with  ? Acute Visit  ?  Patient states that PCP wanted her to be seen for her breathing. Patient does not feel like her breathing is worse. She states she had a lot of fluid build up and was prescribed lasix for a few days but is no longer on it.   ?Dyspnea:  walks anywhere she wants to, yardwork where bends over is a problem - slow pace ?MMRC2 = can't walk a nl pace on a flat grade s sob but does fine slow and flat  ?Cough: none  ?Sleeping: flat bed one pillow  ?SABA use: none in between duoneb  ?02: none  ?Covid status:   vax x 2  ? ? ?No obvious day to day or daytime variability or assoc excess/ purulent sputum or mucus plugs or hemoptysis or cp or chest tightness, subjective wheeze or overt sinus or hb symptoms.  ? ?Sleeping as above  without  nocturnal  or early am exacerbation  of respiratory  c/o's or need for noct saba. Also denies any obvious fluctuation of symptoms with weather or environmental changes or other aggravating or alleviating factors except as outlined above  ? ?No unusual exposure hx or h/o childhood pna/ asthma or knowledge of premature birth. ? ?Current Allergies, Complete Past Medical History, Past Surgical History, Family History, and Social History were reviewed in [employer] record. ? ?ROS  The following are not active complaints unless bolded ?Hoarseness, sore throat, dysphagia, dental problems, itching, sneezing,  nasal congestion or discharge of excess mucus or purulent secretions, ear ache,   fever, chills, sweats, unintended wt loss or wt gain, classically pleuritic or exertional cp,  orthopnea pnd or arm/hand swelling  or leg swelling better, presyncope, palpitations, abdominal pain, anorexia, nausea, vomiting, diarrhea  or change in bowel habits or change in bladder habits, change in stools or change in urine, dysuria, hematuria,  rash, arthralgias, visual complaints, headache, numbness, weakness or ataxia or problems with walking or coordination,  change in mood or  memory. ?      ? ?Current Meds  ?Medication Sig  ? albuterol (ACCUNEB)  1.25 MG/3ML nebulizer solution Take 3 mLs (1.25 mg total) by nebulization 2 (two) times daily.  ? Aspirin 81 MG CAPS Take 81 mg by mouth daily.  ? atorvastatin (LIPITOR) 40 MG tablet TAKE 1 TABLET BY MOUTH DAILY AT BEDTIME  ? budesonide (PULMICORT) 0.25 MG/2ML nebulizer solution Take 2 mLs (0.25 mg total) by nebulization 2 (two) times daily.  ? clonazePAM (KLONOPIN) 0.5 MG tablet Take 0.5 mg by mouth daily as needed for anxiety.   ? diphenhydrAMINE (BENADRYL) 50 MG tablet Take 50 mg by mouth as needed for allergies (bee stings).   ? donepezil (ARICEPT) 5 MG tablet Take 5 mg by mouth daily.  ? estradiol (ESTRACE) 0.1 MG/GM vaginal cream Place vaginally.  ? ibuprofen  (ADVIL) 200 MG tablet Take 400 mg by mouth every 6 (six) hours as needed for moderate pain.  ? ipratropium (ATROVENT) 0.02 % nebulizer solution Take 2.5 mLs (0.5 mg total) by nebulization 2 (two) times daily.  ? levothyroxine (SYNTHROID, LEVOTHROID) 112 MCG tablet Take 112 mcg by mouth daily.  ? losartan (COZAAR) 25 MG tablet Take 25 mg by mouth daily.  ? mirtazapine (REMERON) 15 MG tablet Take 15 mg by mouth at bedtime.  ? nystatin cream (MYCOSTATIN) Apply topically.  ? predniSONE (DELTASONE) 20 MG tablet 2 tabs for 2 days,  1 tabs for 2 days, then 1/2 tab for 2 days, then stop  ? triamcinolone (KENALOG) 0.1 % Apply topically.  ? Vitamin D, Ergocalciferol, (DRISDOL) 1.25 MG (50000 UNIT) CAPS capsule Take 50,000 Units by mouth once a week.  ?    ? ?  ? ? ?   ?Objective:  ? Physical Exam ? ?wts ?  ?02/25/2022        172  ?08/08/2021        160  ?07/01/2020        161 ?05/10/2019          149  ?03/11/2018        139  ?03/10/2017          152  ? 08/14/2016     157   ?07/08/16 161 lb 12.8 oz (73.4 kg)  ?06/10/16 164 lb (74.4 kg)  ?11/03/11 145 lb 8 oz (66 kg)  ?  ? Vital signs reviewed  02/25/2022  - Note at rest 02 sats  97% on RA  ? ?General appearance:    pleasant elderly mod hoarse wf nad   ? ?HEENT : full dentures/ oropharynx clear   ? ? ?NECK :  without JVD/Nodes/TM/ nl carotid upstrokes bilaterally ? ? ?LUNGS: no acc muscle use,  Nl contour chest which is clear to A and P bilaterally without cough on insp or exp maneuvers ? ? ?CV:  RRR  no s3 or murmur or increase in P2, and no edema  ? ?ABD:  soft and nontender with nl inspiratory excursion in the supine position. No bruits or organomegaly appreciated, bowel sounds nl ? ?MS:  Nl gait/ ext warm without deformities, calf tenderness, cyanosis or clubbing ?No obvious joint restrictions  ? ?SKIN: warm and dry without lesions   ? ?NEURO:  alert, approp, nl sensorium with  no motor or cerebellar deficits apparent.  ? ?  ?CXR PA and Lateral:   02/25/2022 :    ?I personally  reviewed images and agree with radiology impression as follows:    ?Mild/mod copd -  No cm/ chf  ?  ?  ?  ?  ?   ? ?   ?Assessment:  ?   ?  ?  ? ?

## 2022-02-25 NOTE — Assessment & Plan Note (Signed)
Quit smoking 2005 ?PFTs July 06, 2006   FEV1 of 58% predicted with a ratio of 56%  And 15% improvement after bronchodilator consistent with an asthmatic component. ?Allergy profile 06/10/2016 >  Eos 0.1/  IgE 7 RAST neg  ?- Sinus CT 06/15/2016 > neg    ?- PFT's  07/08/2016  FEV1 1.45 (79 % ) ratio 74  p 10 % improvement from saba p duoneb > 6 h  prior to study with DLCO  52 % corrects to 71  % for alv volume   ? ?All goals of chronic asthma control met including optimal function and elimination of symptoms with minimal need for rescue therapy. ? ?Contingencies discussed in full including contacting this office immediately if not controlling the symptoms using the rule of two's.    ? ?F/u can be q 6 m, sooner prn ? ?    ?  ? ?Each maintenance medication was reviewed in detail including emphasizing most importantly the difference between maintenance and prns and under what circumstances the prns are to be triggered using an action plan format where appropriate. ? ?Total time for H and P, chart review, counseling, reviewing neb device(s) and generating customized AVS unique to this office visit / same day charting = 25 min  ?     ?

## 2022-02-25 NOTE — Patient Instructions (Signed)
No change in your medications.     Please schedule a follow up visit in 6 months but call sooner if needed  

## 2022-02-25 NOTE — Telephone Encounter (Signed)
[place] from [facility] states an order for an echo was faxed to the office and they received a fax back stating it needed prior authorization. She states she spoke with the insurance company and it does not require the authorization. She says she faxed it back on Thursday and called to follow up. I did not see an order entered yet.  ?

## 2022-02-26 ENCOUNTER — Other Ambulatory Visit (HOSPITAL_BASED_OUTPATIENT_CLINIC_OR_DEPARTMENT_OTHER): Payer: Self-pay | Admitting: Family Medicine

## 2022-02-26 DIAGNOSIS — I50810 Right heart failure, unspecified: Secondary | ICD-10-CM

## 2022-03-04 ENCOUNTER — Ambulatory Visit (INDEPENDENT_AMBULATORY_CARE_PROVIDER_SITE_OTHER): Payer: Medicare Other

## 2022-03-04 DIAGNOSIS — I50810 Right heart failure, unspecified: Secondary | ICD-10-CM | POA: Diagnosis not present

## 2022-03-04 LAB — ECHOCARDIOGRAM COMPLETE
AR max vel: 2.6 cm2
AV Area VTI: 2.63 cm2
AV Area mean vel: 2.44 cm2
AV Mean grad: 3 mmHg
AV Peak grad: 6.3 mmHg
AV Vena cont: 0.27 cm
Ao pk vel: 1.25 m/s
Area-P 1/2: 2 cm2
Calc EF: 72.5 %
P 1/2 time: 712 msec
S' Lateral: 2.13 cm
Single Plane A2C EF: 70.9 %
Single Plane A4C EF: 74.1 %

## 2022-03-23 ENCOUNTER — Other Ambulatory Visit: Payer: Self-pay | Admitting: Adult Health

## 2022-06-25 ENCOUNTER — Telehealth: Payer: Self-pay | Admitting: Internal Medicine

## 2022-06-25 MED ORDER — IPRATROPIUM-ALBUTEROL 0.5-2.5 (3) MG/3ML IN SOLN: 3.0000 mL | Freq: Two times a day (BID) | RESPIRATORY_TRACT | 11 refills | Status: DC

## 2022-06-25 NOTE — Telephone Encounter (Signed)
Called and spoke with pt who states she wants to have one Rx for duoneb sent to the pharmacy instead of her having to continue to do the separate ipratropium and separate albuterol solutions. Rx for duoneb has been sent to pharmacy for pt. Nothing further needed.

## 2022-07-07 ENCOUNTER — Ambulatory Visit (INDEPENDENT_AMBULATORY_CARE_PROVIDER_SITE_OTHER): Payer: Medicare Other

## 2022-07-07 ENCOUNTER — Encounter: Payer: Self-pay | Admitting: Internal Medicine

## 2022-07-07 ENCOUNTER — Telehealth: Payer: Self-pay

## 2022-07-07 ENCOUNTER — Ambulatory Visit: Payer: Medicare Other | Admitting: Internal Medicine

## 2022-07-07 DIAGNOSIS — R0609 Other forms of dyspnea: Secondary | ICD-10-CM

## 2022-07-07 DIAGNOSIS — J449 Chronic obstructive pulmonary disease, unspecified: Secondary | ICD-10-CM | POA: Diagnosis not present

## 2022-07-07 MED ORDER — PANTOPRAZOLE SODIUM 40 MG PO TBEC: 40.0000 mg | DELAYED_RELEASE_TABLET | Freq: Every day | ORAL | 2 refills | Status: DC

## 2022-07-07 MED ORDER — DOXYCYCLINE HYCLATE 100 MG PO TABS: 100.0000 mg | ORAL_TABLET | Freq: Two times a day (BID) | ORAL | 0 refills | Status: DC

## 2022-07-07 MED ORDER — FAMOTIDINE 20 MG PO TABS: ORAL_TABLET | ORAL | 11 refills | Status: DC

## 2022-07-07 NOTE — Patient Instructions (Addendum)
Please remember to go to the  x-ray department  for your tests - we will call you with the results when they are available    Doxycycline 100 mg twice daily before meals with glass of water   I will review Dr Dennie Fetters labs before ordering more if needed   Pantoprazole (protonix) 40 mg   Take  30-60 min before first meal of the day and Pepcid (famotidine)  20 mg after supper until return to office - this is the best way to tell whether stomach acid is contributing to your problem.    GERD (REFLUX)  is an extremely common cause of respiratory symptoms just like yours , many times with no obvious heartburn at all.    It can be treated with medication, but also with lifestyle changes including elevation of the head of your bed (ideally with 6 -8inch blocks under the headboard of your bed),  Smoking cessation, avoidance of late meals, excessive alcohol, and avoid fatty foods, chocolate, peppermint, colas, red wine, and acidic juices such as orange juice.  NO MINT OR MENTHOL PRODUCTS SO NO COUGH DROPS  USE SUGARLESS CANDY INSTEAD (Jolley ranchers or Stover's or Life Savers) or even ice chips will also do - the key is to swallow to prevent all throat clearing. NO OIL BASED VITAMINS - use powdered substitutes.  Avoid fish oil when coughing.   Keep previous appt

## 2022-07-07 NOTE — Progress Notes (Unsigned)
Subjective:     Patient ID: Sharon Mcdaniel, female   DOB: Sep 04, 1936      MRN: [id number]   Brief patient profile:   27  yowf quit smoking in 2005 with severe CAP dx as GOLD II copd Sept 2007 and referred to pulmonary clinic 06/10/2016 by Dr Kim/ Thedora Hinders with nl pfts 07/2016 p neb c/w AB not copd   History of Present Illness  06/10/2016 1st [facility] office visit/ Miranda Garber   Chief Complaint  Patient presents with   Pulmonary Consult    Pt referred by Dr Epimenio Sarin for COPD/lung mass. Recent PET scan 05/11/16.   onset of new pattern in Dec 2016 with severe cough that comes and goes and a persistent sense of doe that persisteed since them not back to baseline esp Worse in  June 2017 but improved p abx Doe = MMRC1 = can walk nl pace, flat grade, can't hurry or go uphills or steps s sob Since onset of recurrent cough has had unresolved nasal congestion and intermittent purulent nasal drainage correlating with flares of "bronchitis' pred / abx def help cough but keeps coming back sev weeks later  rec  schedule sinus CT> neg 06/15/16  GERD  Diet    02/25/2022  f/u ov/Jasiri Hanawalt re: AB   maint on duoneb / pulmicort  bid  Chief Complaint  Patient presents with   Acute Visit    Patient states that PCP wanted her to be seen for her breathing. Patient does not feel like her breathing is worse. She states she had a lot of fluid build up and was prescribed lasix for a few days but is no longer on it.   Dyspnea:  walks anywhere she wants to, yardwork where bends over is a problem - slow pace MMRC2 = can't walk a nl pace on a flat grade s sob but does fine slow and flat  Cough: none  Sleeping: flat bed one pillow  SABA use: none in between duoneb  02: none  Covid status:   vax x 2  Rec No change rx     07/07/2022  f/u ov/Naevia Unterreiner re: AB   maint on duoneb/pulmicort bid   Chief Complaint  Patient presents with   Follow-up    Pt states that for the last month her breathing is worse with exertion.  Pt was put on lasix from her PCP for inflammation on her lungs. Productive cough with yellow sputum   Dyspnea:  gradually worse to point of across the room sob not really better on pred /lasix Cough: not much / min rattle in am slt yellow Sleeping: flat bed/ one pillow ok  SABA use: maybe once a day ? Not helping  02: none      No obvious day to day or daytime variability or assoc excess/ purulent sputum or mucus plugs or hemoptysis or cp or chest tightness, subjective wheeze or overt  hb symptoms.   Sleeping  without nocturnal  or early am exacerbation  of respiratory  c/o's or need for noct saba. Also denies any obvious fluctuation of symptoms with weather or environmental changes or other aggravating or alleviating factors except as outlined above   No unusual exposure hx or h/o childhood pna/ asthma or knowledge of premature birth.  Current Allergies, Complete Past Medical History, Past Surgical History, Family History, and Social History were reviewed in [employer] record.  ROS  The following are not active complaints unless bolded Hoarseness, sore  throat, dysphagia, dental problems, itching, sneezing,  nasal congestion or discharge of excess mucus or purulent secretions, ear ache,   fever, chills, sweats, unintended wt loss or wt gain, classically pleuritic or exertional cp,  orthopnea pnd or arm/hand swelling  or leg swelling, presyncope, palpitations, abdominal pain, anorexia, nausea, vomiting, diarrhea  or change in bowel habits or change in bladder habits, change in stools or change in urine, dysuria, hematuria,  rash, arthralgias, visual complaints, headache, numbness, weakness or ataxia or problems with walking or coordination,  change in mood or  memory.        Current Meds  Medication Sig   Aspirin 81 MG CAPS Take 81 mg by mouth daily.   atorvastatin (LIPITOR) 40 MG tablet TAKE 1 TABLET BY MOUTH DAILY AT BEDTIME   budesonide (PULMICORT) 0.25 MG/2ML  nebulizer solution USE 2 VIALS VIA NEBULIZER TWICE DAILY   clonazePAM (KLONOPIN) 0.5 MG tablet Take 0.5 mg by mouth daily as needed for anxiety.    diphenhydrAMINE (BENADRYL) 50 MG tablet Take 50 mg by mouth as needed for allergies (bee stings).    donepezil (ARICEPT) 5 MG tablet Take 5 mg by mouth daily.   estradiol (ESTRACE) 0.1 MG/GM vaginal cream Place vaginally.   furosemide (LASIX) 20 MG tablet Take 20 mg by mouth daily.   ibuprofen (ADVIL) 200 MG tablet Take 400 mg by mouth every 6 (six) hours as needed for moderate pain.   ipratropium-albuterol (DUONEB) 0.5-2.5 (3) MG/3ML SOLN Take 3 mLs by nebulization in the morning and at bedtime.   levothyroxine (SYNTHROID, LEVOTHROID) 112 MCG tablet Take 112 mcg by mouth daily.   losartan (COZAAR) 25 MG tablet Take 25 mg by mouth daily.   mirtazapine (REMERON) 15 MG tablet Take 15 mg by mouth at bedtime.   montelukast (SINGULAIR) 10 MG tablet SMARTSIG:1 Tablet(s) By Mouth Every Evening   nystatin cream (MYCOSTATIN) Apply topically.        triamcinolone (KENALOG) 0.1 % Apply topically.   Vitamin D, Ergocalciferol, (DRISDOL) 1.25 MG (50000 UNIT) CAPS capsule Take 50,000 Units by mouth once a week.              Objective:   Physical Exam  wts   07/07/2022          167  02/25/2022        172  08/08/2021        160  07/01/2020        161 05/10/2019          149  03/11/2018        139  03/10/2017          152   08/14/2016     157   07/08/16 161 lb 12.8 oz (73.4 kg)  06/10/16 164 lb (74.4 kg)  11/03/11 145 lb 8 oz (66 kg)    Vital signs reviewed  07/07/2022  - Note at rest 02 sats  98% on RA   General appearance:  somber  hoarse wf/ prominent pseudowheeze     HEENT : Oropharynx  clear     Nasal turbinates nl    NECK :  without  apparent JVD/ palpable Nodes/TM    LUNGS: no acc muscle use,  Nl contour chest which is clear to A and P bilaterally without cough on insp or exp maneuvers   CV:  RRR  no s3 or murmur or increase in P2, and no  edema   ABD:  soft and nontender with nl inspiratory excursion in  the supine position. No bruits or organomegaly appreciated   MS:  Nl gait/ ext warm without deformities Or obvious joint restrictions  calf tenderness, cyanosis or clubbing    SKIN: warm and dry without lesions    NEURO:  alert, approp, nl sensorium with  no motor or cerebellar deficits apparent.   Dr Darron Doom labs 07/01/22  reviewed Eos 0.2 /  pro BNP 453 CXR PA and Lateral:   07/07/2022 :      I personally reviewed images and agree with radiology impression as follows:    No active cardiopulmonary disease.       Assessment:

## 2022-07-08 ENCOUNTER — Encounter: Payer: Self-pay | Admitting: Internal Medicine

## 2022-07-08 NOTE — Assessment & Plan Note (Addendum)
Quit smoking 2005 PFTs July 06, 2006   FEV1 of 58% predicted with a ratio of 56%  And 15% improvement after bronchodilator consistent with an asthmatic component. Allergy profile 06/10/2016 >  Eos 0.1/  IgE 7 RAST neg  - Sinus CT 06/15/2016 > neg    - PFT's  07/08/2016  FEV1 1.45 (79 % ) ratio 74  p 10 % improvement from saba p duoneb > 6 h  prior to study with DLCO  52 % corrects to 71  % for alv volume    AB component appears well controlled but she does have pseudowheeze on exam which can be anxiety or GERD related so rec max rx for GERD and f/u here as scheduled previously.    Medical decision making was a moderate level of complexity in this case because of a chronic condition/diagnosis with  ? Exacerbation/ progression ? side effects of treatment/ alternate diagnoses   requiring extra time for  H and P, chart review, counseling, observing walking sats and generating customized AVS unique to this office visit and charting.   Each maintenance medication was reviewed in detail including emphasizing most importantly the difference between maintenance and prns and under what circumstances the prns are to be triggered using an action plan format where appropriate. Please see avs for details which were reviewed in writing by both me and my nurse and patient given a written copy highlighted where appropriate with yellow highlighter for the patient's continued care at home along with an updated version of their medications.  Patient was asked to maintain medication reconciliation by comparing this list to the actual medications being used at home and to contact this office right away if there is a conflict or discrepancy.

## 2022-07-08 NOTE — Assessment & Plan Note (Signed)
06/10/2016  Walked RA x 3 laps @ 185 ft each stopped due to  End of study, nl pace, no sob or desat   - Echo 03/04/22 1. Left ventricular ejection fraction by 3D volume is 73 %.   vLV normal function. The LV has no regional wall  motion abnormalities. There is mild concentric LVH  LV diastolic parameters were normal.  2. RV  systolic function and size are  Normal.  3. The mitral valve is normal in structure. Mild MR  4. AV  is normal in structure. Mild AR - PHT measures 712 msec.  5. borderline dilatation of the aortic root, measuring 37 mm.  6. The inferior vena cava is normal in size with greater than 50%  respiratory variability, suggesting right atrial pressure of 3 mmHg. - 07/07/2022    Walked x 100 ft, stopped to hold onto wall due to balance, not sob with sats ok     No obvious pulmonary mechanism limiting exertion at this point, cards eval in progress.

## 2022-07-13 NOTE — Progress Notes (Signed)
Spoke with pt and notified of results per Dr. Wert. Pt verbalized understanding and denied any questions. 

## 2022-07-16 ENCOUNTER — Other Ambulatory Visit (HOSPITAL_COMMUNITY): Payer: Self-pay | Admitting: Family Medicine

## 2022-07-16 DIAGNOSIS — J449 Chronic obstructive pulmonary disease, unspecified: Secondary | ICD-10-CM

## 2022-07-16 NOTE — H&P (View-Only) (Signed)
Cardiology Office Note:    Date:  07/20/2022   ID:  Sharon Mcdaniel, DOB Dec 18, 1935, MRN [id number]  PCP:  Hayden Rasmussen, MD   [facility] Providers Cardiologist:  Jenkins Rouge, MD     Referring MD: Hayden Rasmussen, MD   Chief Complaint: chest discomfort  History of Present Illness:    Sharon Mcdaniel is a very pleasant 86 y.o. female with a hx of mild MR, mild AI, former tobacco abuse, COPD, palpitations, carotid artery disease, hyperlipidemia, PVD s/p right femoral-popliteal artery bypass 04/2020, and known tibial artery occlusive disease  Previously seen by Dr. Einar Gip, she established with our group in 2017 seen by Dr. Johnsie Cancel. History of PVD followed by Dr. Scot Dock, [facility].   She was last seen in our office on 05/19/2021 by Dr. Johnsie Cancel. She was seen with concerns for fatigue and palpitations.  Cardiac monitor revealed no significant arrhythmias, normal sinus rhythm, symptoms of fluttering/dyspnea with no correlation of arrhythmia. Additionally having facial paresthesia, etiology unclear. No changes to treatment plan were implemented and one year follow-up was advised.  Echocardiogram 03/04/22 revealed LVEF 73%, no rwma, normal diastolic, normal RV, mild MR, mild AI, borderline dilatation of the aortic root measuring 37 mm.   Today, she is here with her daughter for evaluation. Notes that when she gets up to start to walk, she has to stop after just a few steps due to chest pressure, feels like her heart starts to quiver when she walks, DOE, and presyncope.  Says she feels like everything is going to blackout if she does not stop. No syncope. Daughter reports this is a significant change over the past 2-3 months. Seen by pulmonary and not felt to have significant change in lung function. Uses nebulizer twice daily for COPD. Weight stable, fluctuates only 1-2 lbs on occasion. She denies lower extremity edema, palpitations, melena, hematuria, hemoptysis, diaphoresis, weakness, orthopnea, and PND.    Past Medical History:  Diagnosis Date   COPD (chronic obstructive pulmonary disease) ([place])    Peripheral vascular disease ([place])     Past Surgical History:  Procedure Laterality Date   ABDOMINAL HYSTERECTOMY  1980   BACK SURGERY  07/2011   synovial cyst removal   INTRAOPERATIVE ARTERIOGRAM Right 04/28/2020   Procedure: INTRA OPERATIVE ARTERIOGRAM;  Surgeon: Angelia Mould, MD;  Location: [place];  Service: Vascular;  Laterality: Right;   THROMBECTOMY OF BYPASS GRAFT FEMORAL- POPLITEAL ARTERY Right 04/28/2020   Procedure: RIGHT FEMORAL-BELOW KNEE POPLITEAL BYPASS GRAFT USING NON-REVERSED GREATER SAPHENOUS VEIN GRAFT HARVESTED FROM RIGHT UPPER LEG. ;  Surgeon: Angelia Mould, MD;  Location: [place];  Service: Vascular;  Laterality: Right;   TUBAL LIGATION  1970    Current Medications: Current Meds  Medication Sig   Aspirin 81 MG CAPS Take 81 mg by mouth daily.   atorvastatin (LIPITOR) 40 MG tablet TAKE 1 TABLET BY MOUTH DAILY AT BEDTIME   budesonide (PULMICORT) 0.25 MG/2ML nebulizer solution USE 2 VIALS VIA NEBULIZER TWICE DAILY   clobetasol cream (TEMOVATE) 0.05 % Apply topically 2 (two) times daily.   clonazePAM (KLONOPIN) 0.5 MG tablet Take 0.5 mg by mouth daily as needed for anxiety.    diphenhydrAMINE (BENADRYL) 50 MG tablet Take 50 mg by mouth as needed for allergies (bee stings).    donepezil (ARICEPT) 5 MG tablet Take 5 mg by mouth daily.   doxycycline (VIBRA-TABS) 100 MG tablet Take 1 tablet (100 mg total) by mouth 2 (two) times daily.   estradiol (  ESTRACE) 0.1 MG/GM vaginal cream Place vaginally.   famotidine (PEPCID) 20 MG tablet One after supper   furosemide (LASIX) 20 MG tablet Take 20 mg by mouth daily.   ibuprofen (ADVIL) 200 MG tablet Take 400 mg by mouth every 6 (six) hours as needed for moderate pain.   ipratropium-albuterol (DUONEB) 0.5-2.5 (3) MG/3ML SOLN Take 3 mLs by nebulization in the morning and at bedtime.   levothyroxine (SYNTHROID, LEVOTHROID)  112 MCG tablet Take 112 mcg by mouth daily.   losartan (COZAAR) 25 MG tablet Take 25 mg by mouth daily.   mirtazapine (REMERON) 15 MG tablet Take 15 mg by mouth at bedtime.   montelukast (SINGULAIR) 10 MG tablet SMARTSIG:1 Tablet(s) By Mouth Every Evening   nitroGLYCERIN (NITROSTAT) 0.4 MG SL tablet Place 1 tablet (0.4 mg total) under the tongue every 5 (five) minutes as needed for chest pain.   nystatin cream (MYCOSTATIN) Apply topically.   pantoprazole (PROTONIX) 40 MG tablet Take 1 tablet (40 mg total) by mouth daily. Take 30-60 min before first meal of the day   predniSONE (DELTASONE) 50 MG tablet Take 1 tablet (50 mg total) by mouth as directed. Take one ( 1) tablet 13 hours prior to procedure, Take one (1) tablet (50 mg) 7 hours prior, Take one ( 1) tablet ( 50 mg) 1 hour prior.   triamcinolone (KENALOG) 0.1 % Apply topically.   Vitamin D, Ergocalciferol, (DRISDOL) 1.25 MG (50000 UNIT) CAPS capsule Take 50,000 Units by mouth once a week.     Allergies:   Betadine [povidone iodine], Fluconazole in dextrose, Gatifloxacin, Iodinated contrast media, Ioxaglate, Quinolones, Tequin, Alendronate, Tramadol, Zithromax [azithromycin], and Codeine   Social History   Socioeconomic History   Marital status: Widowed    Spouse name: Not on file   Number of children: Not on file   Years of education: Not on file   Highest education level: Not on file  Occupational History   Occupation: retired  Tobacco Use   Smoking status: Former    Packs/day: 1.00    Years: 30.00    Total pack years: 30.00    Types: Cigarettes    Quit date: 11/03/2003    Years since quitting: 18.7   Smokeless tobacco: Never  Vaping Use   Vaping Use: Never used  Substance and Sexual Activity   Alcohol use: No   Drug use: No   Sexual activity: Never  Other Topics Concern   Not on file  Social History Narrative   Not on file   Social Determinants of Health   Financial Resource Strain: Not on file  Food Insecurity:  Not on file  Transportation Needs: Not on file  Physical Activity: Not on file  Stress: Not on file  Social Connections: Not on file     Family History: The patient's family history includes Asthma in her father; Heart disease in her father; Lung cancer in her mother; Rheum arthritis in her mother.  ROS:   Please see the history of present illness.    + chest tightness + DOE + presyncope All other systems reviewed and are negative.  Labs/Other Studies Reviewed:    The following studies were reviewed today:  Echo 03/04/22  1. Left ventricular ejection fraction by 3D volume is 73 %. The left  ventricle has normal function. The left ventricle has no regional wall  motion abnormalities. There is mild concentric left ventricular  hypertrophy. Left ventricular diastolic  parameters were normal.   2. Right ventricular systolic function  is normal. The right ventricular  size is normal.   3. The mitral valve is normal in structure. Mild mitral valve  regurgitation. No evidence of mitral stenosis.   4. The aortic valve is normal in structure. Aortic valve regurgitation is  mild. Aortic valve sclerosis is present, with no evidence of aortic valve  stenosis. Aortic regurgitation PHT measures 712 msec.   5. There is borderline dilatation of the aortic root, measuring 37 mm.   6. The inferior vena cava is normal in size with greater than 50%  respiratory variability, suggesting right atrial pressure of 3 mmHg.   Carotid Duplex 07/06/21  Right Carotid: Velocities in the right ICA are consistent with a 1-39%  stenosis.                Non-hemodynamically significant plaque <50% noted in the  CCA.                Stable RICA velocities.   Left Carotid: Velocities in the left ICA are consistent with a 1-39%  stenosis.               Stable LICA velocities.   Vertebrals:  Bilateral vertebral arteries demonstrate antegrade flow.  Subclavians: Left subclavian artery flow was disturbed. Normal  flow  hemodynamics              were seen in the right subclavian artery.   *See table(s) above for measurements and observations.   Cardiac monitor 06/11/21  No significant arrhythmias NSR No arrhythmias with symptoms of fluttering/dyspnea   Recent Labs: No results found for requested labs within last 365 days.  Recent Lipid Panel    Component Value Date/Time   CHOL 119 04/29/2020 0312   CHOL 144 10/05/2017 1534   TRIG 51 04/29/2020 0312   HDL 67 04/29/2020 0312   HDL 87 10/05/2017 1534   CHOLHDL 1.8 04/29/2020 0312   VLDL 10 04/29/2020 0312   LDLCALC 42 04/29/2020 0312   LDLCALC 39 10/05/2017 1534     Risk Assessment/Calculations:      Physical Exam:    VS:  BP (!) 118/58   Pulse 68   Ht '5\' 2"'$  (1.575 m)   Wt 169 lb (76.7 kg)   SpO2 97%   BMI 30.91 kg/m     Wt Readings from Last 3 Encounters:  07/20/22 169 lb (76.7 kg)  07/07/22 167 lb 3.2 oz (75.8 kg)  02/25/22 172 lb 12.8 oz (78.4 kg)     GEN:  Well nourished, well developed in no acute distress HEENT: Normal NECK: No JVD; No carotid bruits CARDIAC: RRR, no murmurs, rubs, gallops RESPIRATORY:  Clear to auscultation without rales, wheezing or rhonchi  ABDOMEN: Soft, non-tender, non-distended MUSCULOSKELETAL:  No edema; No deformity. 2+  pedal pulses, equal bilaterally SKIN: Warm and dry NEUROLOGIC:  Alert and oriented x 3 PSYCHIATRIC:  Normal affect   EKG:  EKG is ordered today.  The ekg ordered today demonstrates NSR at 61 bpm, nonspecific TWI aVR, V1-V2, no acute ST abnormality   Diagnoses:    1. Unstable angina ([place])   2. Mixed hyperlipidemia   3. Bilateral carotid artery stenosis   4. Chronic obstructive pulmonary disease, unspecified COPD type ([place])    Assessment and Plan:     Unstable angina: Chest pressure, DOE, and presyncope with short bouts of exertion. Has to stop and rest to keep from passing out. Onset of symptoms over the past 2-3 months. Symptoms concerning for angina. Discussed  with  Dr. Johnsie Cancel, primary cardiologist and with patient and family and through shared decision making we decided to proceed with cardiac catheterization. Risks reviewed with patient and she agrees to proceed. We will add sublingual nitroglycerin for use at home. Will get pre-cath labs today. Continue aspirin.   Carotid artery disease: Bilateral carotid artery stenosis (1-39%) on carotid duplex 07/2021. Symptoms of presyncope occur only with exertion as noted above.  Recommendation to repeat ultrasound 2 years from previous (due 07/2023).   Hyperlipidemia: LDL 42 on 04/29/20. We will recheck today. Continue atorvastatin.   COPD: Recent office visit, COPD felt to be stable. She denies worsening cough. Dyspnea occurs with exertion, associated with chest tightness and presyncope. As noted above, we will get cardiac cath for evaluation of ischemia. Management per pulmonology.   Shared Decision Making/Informed Consent The risks [stroke (1 in 1000), death (1 in 1000), kidney failure [usually temporary] (1 in 500), bleeding (1 in 200), allergic reaction [possibly serious] (1 in 200)], benefits (diagnostic support and management of coronary artery disease) and alternatives of a cardiac catheterization were discussed in detail with Sharon Mcdaniel and she is willing to proceed.   Disposition: 4-6 weeks after cath with Dr. Johnsie Cancel or APP  Medication Adjustments/Labs and Tests Ordered: Current medicines are reviewed at length with the patient today.  Concerns regarding medicines are outlined above.  Orders Placed This Encounter  Procedures   Basic Metabolic Panel (BMET)   CBC   Lipid Profile   EKG 12-Lead   Meds ordered this encounter  Medications   predniSONE (DELTASONE) 50 MG tablet    Sig: Take 1 tablet (50 mg total) by mouth as directed. Take one ( 1) tablet 13 hours prior to procedure, Take one (1) tablet (50 mg) 7 hours prior, Take one ( 1) tablet ( 50 mg) 1 hour prior.    Dispense:  3 tablet    Refill:  0    nitroGLYCERIN (NITROSTAT) 0.4 MG SL tablet    Sig: Place 1 tablet (0.4 mg total) under the tongue every 5 (five) minutes as needed for chest pain.    Dispense:  25 tablet    Refill:  3    Patient Instructions  Medication Instructions:   START Nitroglycerin one (1) tablet sublingual (under tongue) as needed for Chest pain.    If a single episode of chest pain is not relieved by one tablet, the patient will try another within 5 minutes; and if this doesn't relieve the pain, the patient will try another within 5 minutes and if this doesn't relieve the pain the patient is instructed to call 911 for transportation to an emergency department.  *If you need a refill on your cardiac medications before your next appointment, please call your pharmacy*  Lab Work:  TODAY!!!! BMET/CBC/LIPID  If you have labs (blood work) drawn today and your tests are completely normal, you will receive your results only by: [place] (if you have MyChart) OR A paper copy in the mail If you have any lab test that is abnormal or we need to change your treatment, we will call you to review the results.   Testing/Procedures:     Cardiac/Peripheral Catheterization   You are scheduled for a Cardiac Catheterization on Wednesday, September 20 with Dr. Daneen Schick.  1. Please arrive at the Main Entrance A at [facility]: [place], [place] [address] on September 20 at 11:00 AM (This time is two hours before your procedure to ensure your preparation).  Free valet parking service is available. You will check in at ADMITTING. The support person will be asked to wait in the waiting room.  It is OK to have someone drop you off and come back when you are ready to be discharged.        Special note: Every effort is made to have your procedure done on time. Please understand that emergencies sometimes delay scheduled procedures.   . 2. Diet: Do not eat solid foods after midnight.  You may have  clear liquids until 5 AM the day of the procedure.  3. Labs: You will need to have blood drawn on Monday, September 18 at [facility]. [address]  Open: 7:30am - 5pm    Phone: [phone]. You do not need to be fasting.  4. Medication instructions in preparation for your procedure:   Contrast Allergy: Yes, Please take Prednisone '50mg'$  by mouth at: Thirteen hours prior to cath 12:00am on Wednesday Seven hours prior to cath 6:00am on Wednesday And prior to leaving home please take last dose of Prednisone '50mg'$  and Benadryl '50mg'$  by mouth.  HOLD Lasix the am of procedure.   On the morning of your procedure, take Aspirin 81 mg and any morning medicines NOT listed above.  You may use sips of water.  5. Plan to go home the same day, you will only stay overnight if medically necessary. 6. You MUST have a responsible adult to drive you home. 7. An adult MUST be with you the first 24 hours after you arrive home. 8. Bring a current list of your medications, and the last time and date medication taken. 9. Bring ID and current insurance cards. 10.Please wear clothes that are easy to get on and off and wear slip-on shoes.  Thank you for allowing [place] to care for you!   -- [facility]    Follow-Up: At [facility], you and your health needs are our priority.  As part of our continuing mission to provide you with exceptional heart care, we have created designated Provider Care Teams.  These Care Teams include your primary Cardiologist (physician) and Advanced Practice Providers (APPs -  Physician Assistants and Nurse Practitioners) who all work together to provide you with the care you need, when you need it.  We recommend signing up for the patient portal called "MyChart".  Sign up information is provided on this After Visit Summary.  MyChart is used to connect with patients for Virtual Visits (Telemedicine).   Patients are able to view lab/test results, encounter notes, upcoming appointments, etc.  Non-urgent messages can be sent to your provider as well.   To learn more about what you can do with MyChart, go to [email or web address].    Your next appointment:   5 week(s)  The format for your next appointment:   In Person  Provider:   Christen Bame, NP         Important Information About Sugar         Signed, Emmaline Life, NP  07/20/2022 10:58 AM    [place]

## 2022-07-16 NOTE — Progress Notes (Unsigned)
Cardiology Office Note:    Date:  07/20/2022   ID:  Sharon Mcdaniel, DOB 1936/05/31, MRN [id number]  PCP:  Hayden Rasmussen, MD   [facility] Providers Cardiologist:  Jenkins Rouge, MD     Referring MD: Hayden Rasmussen, MD   Chief Complaint: chest discomfort  History of Present Illness:    Sharon Mcdaniel is a very pleasant 86 y.o. female with a hx of mild MR, mild AI, former tobacco abuse, COPD, palpitations, carotid artery disease, hyperlipidemia, PVD s/p right femoral-popliteal artery bypass 04/2020, and known tibial artery occlusive disease  Previously seen by Dr. Einar Gip, she established with our group in 2017 seen by Dr. Johnsie Cancel. History of PVD followed by Dr. Scot Dock, [facility].   She was last seen in our office on 05/19/2021 by Dr. Johnsie Cancel. She was seen with concerns for fatigue and palpitations.  Cardiac monitor revealed no significant arrhythmias, normal sinus rhythm, symptoms of fluttering/dyspnea with no correlation of arrhythmia. Additionally having facial paresthesia, etiology unclear. No changes to treatment plan were implemented and one year follow-up was advised.  Echocardiogram 03/04/22 revealed LVEF 73%, no rwma, normal diastolic, normal RV, mild MR, mild AI, borderline dilatation of the aortic root measuring 37 mm.   Today, she is here with her daughter for evaluation. Notes that when she gets up to start to walk, she has to stop after just a few steps due to chest pressure, feels like her heart starts to quiver when she walks, DOE, and presyncope.  Says she feels like everything is going to blackout if she does not stop. No syncope. Daughter reports this is a significant change over the past 2-3 months. Seen by pulmonary and not felt to have significant change in lung function. Uses nebulizer twice daily for COPD. Weight stable, fluctuates only 1-2 lbs on occasion. She denies lower extremity edema, palpitations, melena, hematuria, hemoptysis, diaphoresis, weakness, orthopnea, and PND.    Past Medical History:  Diagnosis Date   COPD (chronic obstructive pulmonary disease) ([place])    Peripheral vascular disease ([place])     Past Surgical History:  Procedure Laterality Date   ABDOMINAL HYSTERECTOMY  1980   BACK SURGERY  07/2011   synovial cyst removal   INTRAOPERATIVE ARTERIOGRAM Right 04/28/2020   Procedure: INTRA OPERATIVE ARTERIOGRAM;  Surgeon: Angelia Mould, MD;  Location: [place];  Service: Vascular;  Laterality: Right;   THROMBECTOMY OF BYPASS GRAFT FEMORAL- POPLITEAL ARTERY Right 04/28/2020   Procedure: RIGHT FEMORAL-BELOW KNEE POPLITEAL BYPASS GRAFT USING NON-REVERSED GREATER SAPHENOUS VEIN GRAFT HARVESTED FROM RIGHT UPPER LEG. ;  Surgeon: Angelia Mould, MD;  Location: [place];  Service: Vascular;  Laterality: Right;   TUBAL LIGATION  1970    Current Medications: Current Meds  Medication Sig   Aspirin 81 MG CAPS Take 81 mg by mouth daily.   atorvastatin (LIPITOR) 40 MG tablet TAKE 1 TABLET BY MOUTH DAILY AT BEDTIME   budesonide (PULMICORT) 0.25 MG/2ML nebulizer solution USE 2 VIALS VIA NEBULIZER TWICE DAILY   clobetasol cream (TEMOVATE) 0.05 % Apply topically 2 (two) times daily.   clonazePAM (KLONOPIN) 0.5 MG tablet Take 0.5 mg by mouth daily as needed for anxiety.    diphenhydrAMINE (BENADRYL) 50 MG tablet Take 50 mg by mouth as needed for allergies (bee stings).    donepezil (ARICEPT) 5 MG tablet Take 5 mg by mouth daily.   doxycycline (VIBRA-TABS) 100 MG tablet Take 1 tablet (100 mg total) by mouth 2 (two) times daily.   estradiol (  ESTRACE) 0.1 MG/GM vaginal cream Place vaginally.   famotidine (PEPCID) 20 MG tablet One after supper   furosemide (LASIX) 20 MG tablet Take 20 mg by mouth daily.   ibuprofen (ADVIL) 200 MG tablet Take 400 mg by mouth every 6 (six) hours as needed for moderate pain.   ipratropium-albuterol (DUONEB) 0.5-2.5 (3) MG/3ML SOLN Take 3 mLs by nebulization in the morning and at bedtime.   levothyroxine (SYNTHROID, LEVOTHROID)  112 MCG tablet Take 112 mcg by mouth daily.   losartan (COZAAR) 25 MG tablet Take 25 mg by mouth daily.   mirtazapine (REMERON) 15 MG tablet Take 15 mg by mouth at bedtime.   montelukast (SINGULAIR) 10 MG tablet SMARTSIG:1 Tablet(s) By Mouth Every Evening   nitroGLYCERIN (NITROSTAT) 0.4 MG SL tablet Place 1 tablet (0.4 mg total) under the tongue every 5 (five) minutes as needed for chest pain.   nystatin cream (MYCOSTATIN) Apply topically.   pantoprazole (PROTONIX) 40 MG tablet Take 1 tablet (40 mg total) by mouth daily. Take 30-60 min before first meal of the day   predniSONE (DELTASONE) 50 MG tablet Take 1 tablet (50 mg total) by mouth as directed. Take one ( 1) tablet 13 hours prior to procedure, Take one (1) tablet (50 mg) 7 hours prior, Take one ( 1) tablet ( 50 mg) 1 hour prior.   triamcinolone (KENALOG) 0.1 % Apply topically.   Vitamin D, Ergocalciferol, (DRISDOL) 1.25 MG (50000 UNIT) CAPS capsule Take 50,000 Units by mouth once a week.     Allergies:   Betadine [povidone iodine], Fluconazole in dextrose, Gatifloxacin, Iodinated contrast media, Ioxaglate, Quinolones, Tequin, Alendronate, Tramadol, Zithromax [azithromycin], and Codeine   Social History   Socioeconomic History   Marital status: Widowed    Spouse name: Not on file   Number of children: Not on file   Years of education: Not on file   Highest education level: Not on file  Occupational History   Occupation: retired  Tobacco Use   Smoking status: Former    Packs/day: 1.00    Years: 30.00    Total pack years: 30.00    Types: Cigarettes    Quit date: 11/03/2003    Years since quitting: 18.7   Smokeless tobacco: Never  Vaping Use   Vaping Use: Never used  Substance and Sexual Activity   Alcohol use: No   Drug use: No   Sexual activity: Never  Other Topics Concern   Not on file  Social History Narrative   Not on file   Social Determinants of Health   Financial Resource Strain: Not on file  Food Insecurity:  Not on file  Transportation Needs: Not on file  Physical Activity: Not on file  Stress: Not on file  Social Connections: Not on file     Family History: The patient's family history includes Asthma in her father; Heart disease in her father; Lung cancer in her mother; Rheum arthritis in her mother.  ROS:   Please see the history of present illness.    + chest tightness + DOE + presyncope All other systems reviewed and are negative.  Labs/Other Studies Reviewed:    The following studies were reviewed today:  Echo 03/04/22  1. Left ventricular ejection fraction by 3D volume is 73 %. The left  ventricle has normal function. The left ventricle has no regional wall  motion abnormalities. There is mild concentric left ventricular  hypertrophy. Left ventricular diastolic  parameters were normal.   2. Right ventricular systolic function  is normal. The right ventricular  size is normal.   3. The mitral valve is normal in structure. Mild mitral valve  regurgitation. No evidence of mitral stenosis.   4. The aortic valve is normal in structure. Aortic valve regurgitation is  mild. Aortic valve sclerosis is present, with no evidence of aortic valve  stenosis. Aortic regurgitation PHT measures 712 msec.   5. There is borderline dilatation of the aortic root, measuring 37 mm.   6. The inferior vena cava is normal in size with greater than 50%  respiratory variability, suggesting right atrial pressure of 3 mmHg.   Carotid Duplex 07/06/21  Right Carotid: Velocities in the right ICA are consistent with a 1-39%  stenosis.                Non-hemodynamically significant plaque <50% noted in the  CCA.                Stable RICA velocities.   Left Carotid: Velocities in the left ICA are consistent with a 1-39%  stenosis.               Stable LICA velocities.   Vertebrals:  Bilateral vertebral arteries demonstrate antegrade flow.  Subclavians: Left subclavian artery flow was disturbed. Normal  flow  hemodynamics              were seen in the right subclavian artery.   *See table(s) above for measurements and observations.   Cardiac monitor 06/11/21  No significant arrhythmias NSR No arrhythmias with symptoms of fluttering/dyspnea   Recent Labs: No results found for requested labs within last 365 days.  Recent Lipid Panel    Component Value Date/Time   CHOL 119 04/29/2020 0312   CHOL 144 10/05/2017 1534   TRIG 51 04/29/2020 0312   HDL 67 04/29/2020 0312   HDL 87 10/05/2017 1534   CHOLHDL 1.8 04/29/2020 0312   VLDL 10 04/29/2020 0312   LDLCALC 42 04/29/2020 0312   LDLCALC 39 10/05/2017 1534     Risk Assessment/Calculations:      Physical Exam:    VS:  BP (!) 118/58   Pulse 68   Ht '5\' 2"'$  (1.575 m)   Wt 169 lb (76.7 kg)   SpO2 97%   BMI 30.91 kg/m     Wt Readings from Last 3 Encounters:  07/20/22 169 lb (76.7 kg)  07/07/22 167 lb 3.2 oz (75.8 kg)  02/25/22 172 lb 12.8 oz (78.4 kg)     GEN:  Well nourished, well developed in no acute distress HEENT: Normal NECK: No JVD; No carotid bruits CARDIAC: RRR, no murmurs, rubs, gallops RESPIRATORY:  Clear to auscultation without rales, wheezing or rhonchi  ABDOMEN: Soft, non-tender, non-distended MUSCULOSKELETAL:  No edema; No deformity. 2+  pedal pulses, equal bilaterally SKIN: Warm and dry NEUROLOGIC:  Alert and oriented x 3 PSYCHIATRIC:  Normal affect   EKG:  EKG is ordered today.  The ekg ordered today demonstrates NSR at 61 bpm, nonspecific TWI aVR, V1-V2, no acute ST abnormality   Diagnoses:    1. Unstable angina ([place])   2. Mixed hyperlipidemia   3. Bilateral carotid artery stenosis   4. Chronic obstructive pulmonary disease, unspecified COPD type ([place])    Assessment and Plan:     Unstable angina: Chest pressure, DOE, and presyncope with short bouts of exertion. Has to stop and rest to keep from passing out. Onset of symptoms over the past 2-3 months. Symptoms concerning for angina. Discussed  with  Dr. Johnsie Cancel, primary cardiologist and with patient and family and through shared decision making we decided to proceed with cardiac catheterization. Risks reviewed with patient and she agrees to proceed. We will add sublingual nitroglycerin for use at home. Will get pre-cath labs today. Continue aspirin.   Carotid artery disease: Bilateral carotid artery stenosis (1-39%) on carotid duplex 07/2021. Symptoms of presyncope occur only with exertion as noted above.  Recommendation to repeat ultrasound 2 years from previous (due 07/2023).   Hyperlipidemia: LDL 42 on 04/29/20. We will recheck today. Continue atorvastatin.   COPD: Recent office visit, COPD felt to be stable. She denies worsening cough. Dyspnea occurs with exertion, associated with chest tightness and presyncope. As noted above, we will get cardiac cath for evaluation of ischemia. Management per pulmonology.   Shared Decision Making/Informed Consent The risks [stroke (1 in 1000), death (1 in 1000), kidney failure [usually temporary] (1 in 500), bleeding (1 in 200), allergic reaction [possibly serious] (1 in 200)], benefits (diagnostic support and management of coronary artery disease) and alternatives of a cardiac catheterization were discussed in detail with Sharon Mcdaniel and she is willing to proceed.   Disposition: 4-6 weeks after cath with Dr. Johnsie Cancel or APP  Medication Adjustments/Labs and Tests Ordered: Current medicines are reviewed at length with the patient today.  Concerns regarding medicines are outlined above.  Orders Placed This Encounter  Procedures   Basic Metabolic Panel (BMET)   CBC   Lipid Profile   EKG 12-Lead   Meds ordered this encounter  Medications   predniSONE (DELTASONE) 50 MG tablet    Sig: Take 1 tablet (50 mg total) by mouth as directed. Take one ( 1) tablet 13 hours prior to procedure, Take one (1) tablet (50 mg) 7 hours prior, Take one ( 1) tablet ( 50 mg) 1 hour prior.    Dispense:  3 tablet    Refill:  0    nitroGLYCERIN (NITROSTAT) 0.4 MG SL tablet    Sig: Place 1 tablet (0.4 mg total) under the tongue every 5 (five) minutes as needed for chest pain.    Dispense:  25 tablet    Refill:  3    Patient Instructions  Medication Instructions:   START Nitroglycerin one (1) tablet sublingual (under tongue) as needed for Chest pain.    If a single episode of chest pain is not relieved by one tablet, the patient will try another within 5 minutes; and if this doesn't relieve the pain, the patient will try another within 5 minutes and if this doesn't relieve the pain the patient is instructed to call 911 for transportation to an emergency department.  *If you need a refill on your cardiac medications before your next appointment, please call your pharmacy*  Lab Work:  TODAY!!!! BMET/CBC/LIPID  If you have labs (blood work) drawn today and your tests are completely normal, you will receive your results only by: [place] (if you have MyChart) OR A paper copy in the mail If you have any lab test that is abnormal or we need to change your treatment, we will call you to review the results.   Testing/Procedures:     Cardiac/Peripheral Catheterization   You are scheduled for a Cardiac Catheterization on Wednesday, September 20 with Dr. Daneen Schick.  1. Please arrive at the Main Entrance A at [facility]: [place], [place] [address] on September 20 at 11:00 AM (This time is two hours before your procedure to ensure your preparation).  Free valet parking service is available. You will check in at ADMITTING. The support person will be asked to wait in the waiting room.  It is OK to have someone drop you off and come back when you are ready to be discharged.        Special note: Every effort is made to have your procedure done on time. Please understand that emergencies sometimes delay scheduled procedures.   . 2. Diet: Do not eat solid foods after midnight.  You may have  clear liquids until 5 AM the day of the procedure.  3. Labs: You will need to have blood drawn on Monday, September 18 at [facility] at [facility]. [address]  Open: 7:30am - 5pm    Phone: [phone]. You do not need to be fasting.  4. Medication instructions in preparation for your procedure:   Contrast Allergy: Yes, Please take Prednisone '50mg'$  by mouth at: Thirteen hours prior to cath 12:00am on Wednesday Seven hours prior to cath 6:00am on Wednesday And prior to leaving home please take last dose of Prednisone '50mg'$  and Benadryl '50mg'$  by mouth.  HOLD Lasix the am of procedure.   On the morning of your procedure, take Aspirin 81 mg and any morning medicines NOT listed above.  You may use sips of water.  5. Plan to go home the same day, you will only stay overnight if medically necessary. 6. You MUST have a responsible adult to drive you home. 7. An adult MUST be with you the first 24 hours after you arrive home. 8. Bring a current list of your medications, and the last time and date medication taken. 9. Bring ID and current insurance cards. 10.Please wear clothes that are easy to get on and off and wear slip-on shoes.  Thank you for allowing [place] to care for you!   -- [place] [facility]    Follow-Up: At [facility], you and your health needs are our priority.  As part of our continuing mission to provide you with exceptional heart care, we have created designated Provider Care Teams.  These Care Teams include your primary Cardiologist (physician) and Advanced Practice Providers (APPs -  Physician Assistants and Nurse Practitioners) who all work together to provide you with the care you need, when you need it.  We recommend signing up for the patient portal called "MyChart".  Sign up information is provided on this After Visit Summary.  MyChart is used to connect with patients for Virtual Visits (Telemedicine).   Patients are able to view lab/test results, encounter notes, upcoming appointments, etc.  Non-urgent messages can be sent to your provider as well.   To learn more about what you can do with MyChart, go to [email or web address].    Your next appointment:   5 week(s)  The format for your next appointment:   In Person  Provider:   Christen Bame, NP         Important Information About Sugar         Signed, Emmaline Life, NP  07/20/2022 10:58 AM    [place]

## 2022-07-20 ENCOUNTER — Ambulatory Visit: Payer: Medicare Other | Attending: Nurse Practitioner | Admitting: Nurse Practitioner

## 2022-07-20 ENCOUNTER — Encounter: Payer: Self-pay | Admitting: Nurse Practitioner

## 2022-07-20 VITALS — BP 118/58 | HR 68 | Ht 62.0 in | Wt 169.0 lb

## 2022-07-20 DIAGNOSIS — J449 Chronic obstructive pulmonary disease, unspecified: Secondary | ICD-10-CM | POA: Diagnosis not present

## 2022-07-20 DIAGNOSIS — E782 Mixed hyperlipidemia: Secondary | ICD-10-CM

## 2022-07-20 DIAGNOSIS — I6523 Occlusion and stenosis of bilateral carotid arteries: Secondary | ICD-10-CM

## 2022-07-20 DIAGNOSIS — I200 Unstable angina: Secondary | ICD-10-CM | POA: Diagnosis not present

## 2022-07-20 MED ORDER — NITROGLYCERIN 0.4 MG SL SUBL: 0.4000 mg | SUBLINGUAL_TABLET | SUBLINGUAL | 3 refills | Status: AC | PRN

## 2022-07-20 MED ORDER — PREDNISONE 50 MG PO TABS: 50.0000 mg | ORAL_TABLET | ORAL | 0 refills | Status: DC

## 2022-07-20 NOTE — Patient Instructions (Signed)
Medication Instructions:   START Nitroglycerin one (1) tablet sublingual (under tongue) as needed for Chest pain.    If a single episode of chest pain is not relieved by one tablet, the patient will try another within 5 minutes; and if this doesn't relieve the pain, the patient will try another within 5 minutes and if this doesn't relieve the pain the patient is instructed to call 911 for transportation to an emergency department.  *If you need a refill on your cardiac medications before your next appointment, please call your pharmacy*  Lab Work:  TODAY!!!! BMET/CBC/LIPID  If you have labs (blood work) drawn today and your tests are completely normal, you will receive your results only by: [place] (if you have MyChart) OR A paper copy in the mail If you have any lab test that is abnormal or we need to change your treatment, we will call you to review the results.   Testing/Procedures:     Cardiac/Peripheral Catheterization   You are scheduled for a Cardiac Catheterization on Wednesday, September 20 with Dr. Daneen Schick.  1. Please arrive at the Main Entrance A at [facility]: [place], [place] [address] on September 20 at 11:00 AM (This time is two hours before your procedure to ensure your preparation). Free valet parking service is available. You will check in at ADMITTING. The support person will be asked to wait in the waiting room.  It is OK to have someone drop you off and come back when you are ready to be discharged.        Special note: Every effort is made to have your procedure done on time. Please understand that emergencies sometimes delay scheduled procedures.   . 2. Diet: Do not eat solid foods after midnight.  You may have clear liquids until 5 AM the day of the procedure.  3. Labs: You will need to have blood drawn on Monday, September 18 at [facility] at [facility]. [address]  Open: 7:30am -  5pm    Phone: [phone]. You do not need to be fasting.  4. Medication instructions in preparation for your procedure:   Contrast Allergy: Yes, Please take Prednisone '50mg'$  by mouth at: Thirteen hours prior to cath 12:00am on Wednesday Seven hours prior to cath 6:00am on Wednesday And prior to leaving home please take last dose of Prednisone '50mg'$  and Benadryl '50mg'$  by mouth.  HOLD Lasix the am of procedure.   On the morning of your procedure, take Aspirin 81 mg and any morning medicines NOT listed above.  You may use sips of water.  5. Plan to go home the same day, you will only stay overnight if medically necessary. 6. You MUST have a responsible adult to drive you home. 7. An adult MUST be with you the first 24 hours after you arrive home. 8. Bring a current list of your medications, and the last time and date medication taken. 9. Bring ID and current insurance cards. 10.Please wear clothes that are easy to get on and off and wear slip-on shoes.  Thank you for allowing [place] to care for you!   -- [place] [facility]    Follow-Up: At [facility], you and your health needs are our priority.  As part of our continuing mission to provide you with exceptional heart care, we have created designated Provider Care Teams.  These Care Teams include your primary Cardiologist (physician) and  Advanced Practice Providers (APPs -  Physician Assistants and Nurse Practitioners) who all work together to provide you with the care you need, when you need it.  We recommend signing up for the patient portal called "MyChart".  Sign up information is provided on this After Visit Summary.  MyChart is used to connect with patients for Virtual Visits (Telemedicine).  Patients are able to view lab/test results, encounter notes, upcoming appointments, etc.  Non-urgent messages can be sent to your provider as well.   To learn more about what you can do with MyChart, go to  [email or web address].    Your next appointment:   5 week(s)  The format for your next appointment:   In Person  Provider:   Christen Bame, NP         Important Information About Sugar

## 2022-07-21 ENCOUNTER — Telehealth: Payer: Self-pay | Admitting: *Deleted

## 2022-07-21 LAB — LIPID PANEL
Chol/HDL Ratio: 1.7 ratio (ref 0.0–4.4)
Cholesterol, Total: 165 mg/dL (ref 100–199)
HDL: 95 mg/dL (ref >39)
LDL Chol Calc (NIH): 51 mg/dL (ref 0–99)
Triglycerides: 114 mg/dL (ref 0–149)
VLDL Cholesterol Cal: 19 mg/dL (ref 5–40)

## 2022-07-21 LAB — CBC
Hematocrit: 35.5 % (ref 34.0–46.6)
Hemoglobin: 11.6 g/dL (ref 11.1–15.9)
MCH: 29.1 pg (ref 26.6–33.0)
MCHC: 32.7 g/dL (ref 31.5–35.7)
MCV: 89 fL (ref 79–97)
Platelets: 176 10*3/uL (ref 150–450)
RBC: 3.99 x10E6/uL (ref 3.77–5.28)
RDW: 15.8 % — ABNORMAL HIGH (ref 11.7–15.4)
WBC: 15 10*3/uL — ABNORMAL HIGH (ref 3.4–10.8)

## 2022-07-21 LAB — BASIC METABOLIC PANEL
BUN/Creatinine Ratio: 30 — ABNORMAL HIGH (ref 12–28)
BUN: 25 mg/dL (ref 8–27)
CO2: 24 mmol/L (ref 20–29)
Calcium: 8.9 mg/dL (ref 8.7–10.3)
Chloride: 95 mmol/L — ABNORMAL LOW (ref 96–106)
Creatinine, Ser: 0.84 mg/dL (ref 0.57–1.00)
Glucose: 73 mg/dL (ref 70–99)
Potassium: 3.9 mmol/L (ref 3.5–5.2)
Sodium: 137 mmol/L (ref 134–144)
eGFR: 68 mL/min/{1.73_m2} (ref >59)

## 2022-07-21 NOTE — Telephone Encounter (Signed)
Cardiac Catheterization scheduled at [facility] for: Wednesday July 22, 2022 1 PM Arrival time and place: [place] Entrance A at: 11 AM   Nothing to eat after midnight prior to procedure, clear liquids until 5 AM day of procedure.   CONTRAST ALLERGY: yes-13 hour Prednisone and Benadryl Prep reviewed with patient: 07/22/22 Prednisone 50 mg 12 AM (midnight) 07/22/22 Prednisone 50 mg 6 AM 07/22/22 Prednisone 50 mg and Benadryl 50 mg just prior to leaving home for hospital. Pt advised not to drive.   Medication instructions: -Hold:  Lasix-AM of procedure -Except hold medications usual morning medications can be taken with sips of water including aspirin 81 mg.   Confirmed patient has responsible adult to drive home post procedure and be with patient first 24 hours after arriving home.   Patient reports no new symptoms concerning for COVID-19 in the past 10 days.   Reviewed procedure instructions with patient.

## 2022-07-21 NOTE — Telephone Encounter (Signed)
See 07/20/22 CBC results and comments-WBC 15.0 Patient denies fever,chills, or other signs of infection.

## 2022-07-22 ENCOUNTER — Encounter (HOSPITAL_COMMUNITY)
Admission: RE | Disposition: A | Discharge status: Home / Self Care | Payer: Self-pay | Source: Home / Self Care | Attending: Interventional Cardiology

## 2022-07-22 ENCOUNTER — Other Ambulatory Visit: Payer: Self-pay

## 2022-07-22 ENCOUNTER — Ambulatory Visit (HOSPITAL_COMMUNITY)
Admission: RE | Admit: 2022-07-22 | Discharge: 2022-07-22 | Disposition: A | Discharge status: Home / Self Care | Payer: Medicare Other | Attending: Interventional Cardiology | Admitting: Interventional Cardiology

## 2022-07-22 DIAGNOSIS — R42 Dizziness and giddiness: Secondary | ICD-10-CM

## 2022-07-22 DIAGNOSIS — R0609 Other forms of dyspnea: Secondary | ICD-10-CM | POA: Diagnosis present

## 2022-07-22 DIAGNOSIS — J449 Chronic obstructive pulmonary disease, unspecified: Secondary | ICD-10-CM | POA: Insufficient documentation

## 2022-07-22 DIAGNOSIS — I2510 Atherosclerotic heart disease of native coronary artery without angina pectoris: Secondary | ICD-10-CM

## 2022-07-22 DIAGNOSIS — I25110 Atherosclerotic heart disease of native coronary artery with unstable angina pectoris: Secondary | ICD-10-CM | POA: Diagnosis present

## 2022-07-22 DIAGNOSIS — Z79899 Other long term (current) drug therapy: Secondary | ICD-10-CM | POA: Insufficient documentation

## 2022-07-22 DIAGNOSIS — I200 Unstable angina: Secondary | ICD-10-CM

## 2022-07-22 DIAGNOSIS — I6523 Occlusion and stenosis of bilateral carotid arteries: Secondary | ICD-10-CM | POA: Insufficient documentation

## 2022-07-22 DIAGNOSIS — E782 Mixed hyperlipidemia: Secondary | ICD-10-CM

## 2022-07-22 DIAGNOSIS — Z7982 Long term (current) use of aspirin: Secondary | ICD-10-CM | POA: Diagnosis not present

## 2022-07-22 DIAGNOSIS — Z87891 Personal history of nicotine dependence: Secondary | ICD-10-CM | POA: Insufficient documentation

## 2022-07-22 DIAGNOSIS — R9389 Abnormal findings on diagnostic imaging of other specified body structures: Secondary | ICD-10-CM | POA: Diagnosis present

## 2022-07-22 HISTORY — PX: LEFT HEART CATH AND CORONARY ANGIOGRAPHY: CATH118249

## 2022-07-22 SURGERY — LEFT HEART CATH AND CORONARY ANGIOGRAPHY
Anesthesia: LOCAL

## 2022-07-22 MED ORDER — LIDOCAINE HCL (PF) 1 % IJ SOLN
INTRAMUSCULAR | Status: DC | PRN
  Administered 2022-07-22: 2 mL

## 2022-07-22 MED ORDER — VERAPAMIL HCL 2.5 MG/ML IV SOLN
INTRAVENOUS | Status: AC
  Filled 2022-07-22: qty 2

## 2022-07-22 MED ORDER — ASPIRIN 81 MG PO CHEW: 81.0000 mg | CHEWABLE_TABLET | ORAL | Status: DC

## 2022-07-22 MED ORDER — LIDOCAINE HCL (PF) 1 % IJ SOLN
INTRAMUSCULAR | Status: AC
  Filled 2022-07-22: qty 30

## 2022-07-22 MED ORDER — SODIUM CHLORIDE 0.9% FLUSH: 3.0000 mL | INTRAVENOUS | Status: DC | PRN

## 2022-07-22 MED ORDER — HEPARIN (PORCINE) IN NACL 1000-0.9 UT/500ML-% IV SOLN
INTRAVENOUS | Status: AC
  Filled 2022-07-22: qty 1000

## 2022-07-22 MED ORDER — SODIUM CHLORIDE 0.9 % WEIGHT BASED INFUSION
3.0000 mL/kg/h | INTRAVENOUS | Status: AC
  Administered 2022-07-22: 3 mL/kg/h via INTRAVENOUS

## 2022-07-22 MED ORDER — HEPARIN SODIUM (PORCINE) 1000 UNIT/ML IJ SOLN
INTRAMUSCULAR | Status: AC
  Filled 2022-07-22: qty 10

## 2022-07-22 MED ORDER — SODIUM CHLORIDE 0.9 % IV SOLN: 250.0000 mL | INTRAVENOUS | Status: DC | PRN

## 2022-07-22 MED ORDER — VERAPAMIL HCL 2.5 MG/ML IV SOLN
INTRAVENOUS | Status: DC | PRN
  Administered 2022-07-22: 10 mL via INTRA_ARTERIAL

## 2022-07-22 MED ORDER — SODIUM CHLORIDE 0.9 % WEIGHT BASED INFUSION: 1.0000 mL/kg/h | INTRAVENOUS | Status: DC

## 2022-07-22 MED ORDER — FENTANYL CITRATE (PF) 100 MCG/2ML IJ SOLN
INTRAMUSCULAR | Status: DC | PRN
  Administered 2022-07-22: 25 ug via INTRAVENOUS

## 2022-07-22 MED ORDER — MIDAZOLAM HCL 2 MG/2ML IJ SOLN
INTRAMUSCULAR | Status: DC | PRN
  Administered 2022-07-22: 1 mg via INTRAVENOUS

## 2022-07-22 MED ORDER — FENTANYL CITRATE (PF) 100 MCG/2ML IJ SOLN
INTRAMUSCULAR | Status: AC
  Filled 2022-07-22: qty 2

## 2022-07-22 MED ORDER — IOHEXOL 350 MG/ML SOLN
INTRAVENOUS | Status: DC | PRN
  Administered 2022-07-22: 43 mL via INTRA_ARTERIAL

## 2022-07-22 MED ORDER — MIDAZOLAM HCL 2 MG/2ML IJ SOLN
INTRAMUSCULAR | Status: AC
  Filled 2022-07-22: qty 2

## 2022-07-22 MED ORDER — HEPARIN SODIUM (PORCINE) 1000 UNIT/ML IJ SOLN
INTRAMUSCULAR | Status: DC | PRN
  Administered 2022-07-22: 3500 [IU] via INTRAVENOUS

## 2022-07-22 MED ORDER — SODIUM CHLORIDE 0.9% FLUSH: 3.0000 mL | Freq: Two times a day (BID) | INTRAVENOUS | Status: DC

## 2022-07-22 MED ORDER — HEPARIN (PORCINE) IN NACL 1000-0.9 UT/500ML-% IV SOLN
INTRAVENOUS | Status: DC | PRN
  Administered 2022-07-22 (×2): 500 mL

## 2022-07-22 SURGICAL SUPPLY — 13 items
CATH 5FR JL3.5 JR4 ANG PIG MP (CATHETERS) IMPLANT
DEVICE RAD COMP TR BAND LRG (VASCULAR PRODUCTS) IMPLANT
GLIDESHEATH SLEND A-KIT 6F 22G (SHEATH) IMPLANT
GUIDEWIRE INQWIRE 1.5J.035X260 (WIRE) IMPLANT
INQWIRE 1.5J .035X260CM (WIRE) ×1
KIT HEART LEFT (KITS) ×1 IMPLANT
PACK CARDIAC CATHETERIZATION (CUSTOM PROCEDURE TRAY) ×1 IMPLANT
PROTECTION STATION PRESSURIZED (MISCELLANEOUS) ×1
SET ATX SIMPLICITY (MISCELLANEOUS) IMPLANT
SHEATH PROBE COVER 6X72 (BAG) IMPLANT
STATION PROTECTION PRESSURIZED (MISCELLANEOUS) IMPLANT
TRANSDUCER W/STOPCOCK (MISCELLANEOUS) ×1 IMPLANT
TUBING CIL FLEX 10 FLL-RA (TUBING) ×1 IMPLANT

## 2022-07-22 NOTE — Discharge Instructions (Signed)

## 2022-07-22 NOTE — Progress Notes (Signed)
TR BAND REMOVAL  LOCATION:    right radial  DEFLATED PER PROTOCOL:    Yes.    TIME BAND OFF / DRESSING APPLIED:    1515   SITE UPON ARRIVAL:    Level 0  SITE AFTER BAND REMOVAL:    Level 0  CIRCULATION SENSATION AND MOVEMENT:    Within Normal Limits   Yes.    COMMENTS:   No bleeding noted

## 2022-07-22 NOTE — CV Procedure (Signed)
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 Right dominant anatomy. Coronaries with diffuse luminal irregularities but no obstructive disease noted. LVEF greater than 55%.  EDP 18 mmHg.   Near syncope and dyspnea with minimal activity of uncertain etiology.  Consider arrhythmia.

## 2022-07-22 NOTE — Interval H&P Note (Signed)
Cath Lab Visit (complete for each Cath Lab visit)  Clinical Evaluation Leading to the Procedure:   ACS: No.  Non-ACS:    Anginal Classification: CCS III  Anti-ischemic medical therapy: Minimal Therapy (1 class of medications)  Non-Invasive Test Results: No non-invasive testing performed  Prior CABG: No previous CABG      History and Physical Interval Note:  07/22/2022 12:15 PM  Sharon Mcdaniel  has presented today for surgery, with the diagnosis of chest pain, shortness of breath.  The various methods of treatment have been discussed with the patient and family. After consideration of risks, benefits and other options for treatment, the patient has consented to  Procedure(s): LEFT HEART CATH AND CORONARY ANGIOGRAPHY (N/A) as a surgical intervention.  The patient's history has been reviewed, patient examined, no change in status, stable for surgery.  I have reviewed the patient's chart and labs.  Questions were answered to the patient's satisfaction.     Belva Crome III

## 2022-07-23 ENCOUNTER — Encounter (HOSPITAL_COMMUNITY): Payer: Self-pay | Admitting: Interventional Cardiology

## 2022-07-24 ENCOUNTER — Other Ambulatory Visit (HOSPITAL_COMMUNITY): Payer: Medicare Other

## 2022-07-24 ENCOUNTER — Ambulatory Visit: Payer: Medicare Other | Attending: Nurse Practitioner

## 2022-07-24 ENCOUNTER — Telehealth: Payer: Self-pay | Admitting: Nurse Practitioner

## 2022-07-24 ENCOUNTER — Other Ambulatory Visit: Payer: Self-pay | Admitting: *Deleted

## 2022-07-24 DIAGNOSIS — I499 Cardiac arrhythmia, unspecified: Secondary | ICD-10-CM

## 2022-07-24 NOTE — Progress Notes (Unsigned)
ZIO XT - 14 mailed to pt's home address.

## 2022-07-24 NOTE — Telephone Encounter (Signed)
Sharon Mcdaniel had her cath yesterday.  She has dye allergy and was premedicated correctly.  Her daughter Sharon Mcdaniel [phone], called the office and left message.  She also called the cath lab, Daughter states Sharon Mcdaniel is red all over, she has given her Benadryl '50mg'$  and Prednisone '20mg'$  around 0930.  I told her that was good and that she would need to probably continue for another 24hours with the Benadryl every 4-6 hrs, she has the prednisone for her COPD.   Also she said the patient had arm board with the coban on that some how got rolled up tight around her arm overnight.  She says her moms arm was bluish and cool to the touch.  She has since removed the armboard and coband and now her arm is warmer and starting to be more pink.  There is no bleeding from the site and she says she can feel her pulse strong.  Can you please follow up with her later to make sure she is doing ok.  [employer] [phone]

## 2022-07-24 NOTE — Telephone Encounter (Signed)
S/w pt's daughter. Pt is doing ok.  Cath site is fine, arm is fine, can feel pulse.   Pt woke up today flush, no fever.  Daughter will call Dr. Roderic Ovens office to cancel echo.  Per Sharyn Lull will mail pt 14 day zio montior, DX: arrhythmia. Confirmed upcoming appointment with Christen Bame after monitor is complete.

## 2022-07-29 DIAGNOSIS — I499 Cardiac arrhythmia, unspecified: Secondary | ICD-10-CM

## 2022-08-10 ENCOUNTER — Ambulatory Visit: Payer: Medicare Other | Admitting: Internal Medicine

## 2022-08-10 ENCOUNTER — Other Ambulatory Visit (HOSPITAL_COMMUNITY): Payer: Medicare Other

## 2022-08-17 ENCOUNTER — Encounter: Payer: Self-pay | Admitting: Internal Medicine

## 2022-08-17 ENCOUNTER — Ambulatory Visit: Payer: Medicare Other | Admitting: Internal Medicine

## 2022-08-17 DIAGNOSIS — J4489 Other specified chronic obstructive pulmonary disease: Secondary | ICD-10-CM

## 2022-08-17 NOTE — Patient Instructions (Signed)
No change in medications    Please schedule a follow up visit in 6  months but call sooner if needed  

## 2022-08-17 NOTE — Progress Notes (Signed)
Subjective:     Patient ID: Sharon Mcdaniel, female   DOB: Dec 21, 1935      MRN: [id number]   Brief patient profile:   7  yowf quit smoking in 2005 with severe CAP dx as GOLD II copd Sept 2007 and referred to pulmonary clinic 06/10/2016 by Dr Kim/ Thedora Hinders with nl pfts 07/2016 p neb c/w AB not copd   History of Present Illness  06/10/2016 1st [facility] office visit/ Sharon Mcdaniel   Chief Complaint  Patient presents with   Pulmonary Consult    Pt referred by Dr Epimenio Sarin for COPD/lung mass. Recent PET scan 05/11/16.   onset of new pattern in Dec 2016 with severe cough that comes and goes and a persistent sense of doe that persisteed since them not back to baseline esp Worse in  June 2017 but improved p abx Doe = MMRC1 = can walk nl pace, flat grade, can't hurry or go uphills or steps s sob Since onset of recurrent cough has had unresolved nasal congestion and intermittent purulent nasal drainage correlating with flares of "bronchitis' pred / abx def help cough but keeps coming back sev weeks later  rec  schedule sinus CT> neg 06/15/16  GERD  Diet    07/07/2022  f/u ov/Sharon Mcdaniel re: AB   maint on duoneb/pulmicort bid   Chief Complaint  Patient presents with   Follow-up    Pt states that for the last month her breathing is worse with exertion. Pt was put on lasix from her PCP for inflammation on her lungs. Productive cough with yellow sputum   Dyspnea:  gradually worse to point of across the room sob not really better on pred /lasix Cough: not much / min rattle in am slt yellow Sleeping: flat bed/ one pillow ok  SABA use: maybe once a day ? Not helping  02: none  Rec Please remember to go to the  x-ray department  > cxr ok    Doxycycline 100 mg twice daily before meals with glass of water  Pantoprazole (protonix) 40 mg   Take  30-60 min before first meal of the day and Pepcid (famotidine)  20 mg after supper until return to office GERD diet reviewed, bed blocks rec  Keep previous appt     07/22/22 Right dominant coronary anatomy with no obstructive coronary disease noted. Ramus intermedius contains a proximal 50 to 60% narrowing. Normal LVEDP.  Normal systolic function with EF greater than 55%.   08/17/2022  f/u ov/Sharon Mcdaniel re: AB maint on duoneb/pulmocort   Chief Complaint  Patient presents with   Follow-up    Cough persistent.  Meds from last ov helped sx   Dyspnea:  getting around house better  Cough: better as is hoarseness  Sleeping: ok flat one pillow  SABA use: no extra  02: none  Covid status:   vax most of them      No obvious day to day or daytime variability or assoc excess/ purulent sputum or mucus plugs or hemoptysis or cp or chest tightness, subjective wheeze or overt sinus or hb symptoms.   Sleeping  without nocturnal  or early am exacerbation  of respiratory  c/o's or need for noct saba. Also denies any obvious fluctuation of symptoms with weather or environmental changes or other aggravating or alleviating factors except as outlined above   No unusual exposure hx or h/o childhood pna/ asthma or knowledge of premature birth.  Current Allergies, Complete Past Medical History, Past Surgical History,  Family History, and Social History were reviewed in [employer] record.  ROS  The following are not active complaints unless bolded Hoarseness, sore throat, dysphagia, dental problems, itching, sneezing,  nasal congestion or discharge of excess mucus or purulent secretions, ear ache,   fever, chills, sweats, unintended wt loss or wt gain, classically pleuritic or exertional cp,  orthopnea pnd or arm/hand swelling  or leg swelling, presyncope, palpitations, abdominal pain, anorexia, nausea, vomiting, diarrhea  or change in bowel habits or change in bladder habits, change in stools or change in urine, dysuria, hematuria,  rash, arthralgias, visual complaints, headache, numbness, weakness or ataxia or problems with walking or coordination,   change in mood or  memory.        Current Meds  Medication Sig   Aspirin 81 MG CAPS Take 81 mg by mouth daily.   atorvastatin (LIPITOR) 40 MG tablet TAKE 1 TABLET BY MOUTH DAILY AT BEDTIME   budesonide (PULMICORT) 0.25 MG/2ML nebulizer solution USE 2 VIALS VIA NEBULIZER TWICE DAILY   clonazePAM (KLONOPIN) 0.5 MG tablet Take 0.25 mg by mouth at bedtime.   diphenhydrAMINE (BENADRYL) 25 MG tablet Take 50 mg by mouth daily as needed (bee stings).   donepezil (ARICEPT) 5 MG tablet Take 5 mg by mouth daily.   estradiol (ESTRACE) 0.1 MG/GM vaginal cream Place 1 Applicatorful vaginally every Thursday.   famotidine (PEPCID) 20 MG tablet One after supper   furosemide (LASIX) 20 MG tablet Take 20 mg by mouth 2 (two) times daily.   ibuprofen (ADVIL) 200 MG tablet Take 200-400 mg by mouth every 6 (six) hours as needed for moderate pain.   ipratropium-albuterol (DUONEB) 0.5-2.5 (3) MG/3ML SOLN Take 3 mLs by nebulization in the morning and at bedtime.   levothyroxine (SYNTHROID, LEVOTHROID) 112 MCG tablet Take 112 mcg by mouth daily.   losartan (COZAAR) 25 MG tablet Take 25 mg by mouth daily.   mirtazapine (REMERON) 15 MG tablet Take 15 mg by mouth at bedtime.   montelukast (SINGULAIR) 10 MG tablet Take 10 mg by mouth daily.   nitroGLYCERIN (NITROSTAT) 0.4 MG SL tablet Place 1 tablet (0.4 mg total) under the tongue every 5 (five) minutes as needed for chest pain.   pantoprazole (PROTONIX) 40 MG tablet Take 1 tablet (40 mg total) by mouth daily. Take 30-60 min before first meal of the day   Vitamin D, Ergocalciferol, (DRISDOL) 1.25 MG (50000 UNIT) CAPS capsule Take 50,000 Units by mouth every 'Sunday.                    Objective:   Physical Exam  wts   08/17/2022      171  07/07/2022          167  02/25/2022        172  08/08/2021        160  07/01/2020        161 05/10/2019          149  03/11/2018        139  03/10/2017          152   08/14/2016     15'$ 7   07/08/16 161 lb 12.8 oz (73.4 kg)   06/10/16 164 lb (74.4 kg)  11/03/11 145 lb 8 oz (66 kg)    Vital signs reviewed  08/17/2022  - Note at rest 02 sats  98% on RA   General appearance:    pleasant  amb wf  HEENT : Oropharynx  clear/ full dentures         NECK :  without  apparent JVD/ palpable Nodes/TM    LUNGS: no acc muscle use,  Nl contour chest which is clear to A and P bilaterally without cough on insp or exp maneuvers   CV:  RRR  no s3 or murmur or increase in P2, and no edema   ABD:  soft and nontender with nl inspiratory excursion in the supine position. No bruits or organomegaly appreciated   MS:  Nl gait/ ext warm without deformities Or obvious joint restrictions  calf tenderness, cyanosis or clubbing    SKIN: warm and dry without lesions    NEURO:  alert, approp, nl sensorium with  no motor or cerebellar deficits apparent.           Assessment:

## 2022-08-17 NOTE — Assessment & Plan Note (Signed)
Quit smoking 2005 PFTs July 06, 2006   FEV1 of 58% predicted with a ratio of 56%  And 15% improvement after bronchodilator consistent with an asthmatic component. Allergy profile 06/10/2016 >  Eos 0.1/  IgE 7 RAST neg  - Sinus CT 06/15/2016 > neg    - PFT's  07/08/2016  FEV1 1.45 (79 % ) ratio 74  p 10 % improvement from saba p duoneb > 6 h  prior to study with DLCO  52 % corrects to 71  % for alv volume  - 07/07/22 added gerd rx > much improved hoarseness / sob/cough @ f/u ov  08/17/2022    Marked improvement with empirical gerd rx and maint on duoneb/pulmicort nebs bid s need for any extra saba at all so All goals of chronic asthma control met including optimal function and elimination of symptoms with minimal need for rescue therapy.  Contingencies discussed in full including contacting this office immediately if not controlling the symptoms using the rule of two's.     F/u 6 months, call sooner if needed         Each maintenance medication was reviewed in detail including emphasizing most importantly the difference between maintenance and prns and under what circumstances the prns are to be triggered using an action plan format where appropriate.  Total time for H and P, chart review, counseling, reviewing neb device(s) and generating customized AVS unique to this office visit / same day charting = 25 min

## 2022-08-21 ENCOUNTER — Telehealth: Payer: Self-pay

## 2022-08-21 MED ORDER — DILTIAZEM HCL 30 MG PO TABS: ORAL_TABLET | ORAL | 11 refills | Status: DC

## 2022-08-21 NOTE — Telephone Encounter (Signed)
-----   Message from Liliane Shi, [place] sent at 08/20/2022 12:43 PM EDT ----- Covering for Christen Bame, NP while she is off. Monitor shows mainly normal sinus rhythm. There were several supraventricular runs. The longest was 19 seconds. No symptoms reported with these. Pt has a hx of asthma. Rx includes nebulizer treatment. She is not a good candidate for beta-blocker Rx. We can send in prn Diltiazem to use as needed for palpitations. PLAN:  -Diltiazem 30 mg once daily as needed for palpitations. -Keep f/u with Sharyn Lull as planned.  Richardson Dopp, PA-C    08/20/2022 12:39 PM

## 2022-08-21 NOTE — Telephone Encounter (Signed)
Called and spoke with patient about results of monitor by Richardson Dopp, PA. She agrees to plan to use Diltiazem as needed. Rx sent to pharmacy on file.

## 2022-08-26 NOTE — Progress Notes (Signed)
Cardiology Office Note:    Date:  08/28/2022   ID:  Sharon Mcdaniel, DOB Mar 24, 1936, MRN [id number]  PCP:  Hayden Rasmussen, MD   [facility] Providers Cardiologist:  Jenkins Rouge, MD     Referring MD: Hayden Rasmussen, MD   Chief Complaint: follow-up SVT  History of Present Illness:    Sharon Mcdaniel is a very pleasant 86 y.o. female with a hx of mild MR, mild AI, former tobacco abuse, COPD, palpitations, carotid artery disease, hyperlipidemia, PVD s/p right femoral-popliteal artery bypass 04/2020, and known tibial artery occlusive disease  Previously seen by Dr. Einar Gip, she established with our group in 2017 seen by Dr. Johnsie Cancel. History of PVD followed by Dr. Scot Dock, [facility].   She was last seen in our office on 05/19/2021 by Dr. Johnsie Cancel. She was seen with concerns for fatigue and palpitations.  Cardiac monitor revealed no significant arrhythmias, normal sinus rhythm, symptoms of fluttering/dyspnea with no correlation of arrhythmia. Additionally having facial paresthesia, etiology unclear. No changes to treatment plan were implemented and one year follow-up was advised.  Echocardiogram 03/04/22 revealed LVEF 73%, no rwma, normal diastolic, normal RV, mild MR, mild AI, borderline dilatation of the aortic root measuring 37 mm.   Seen by me in cardiology clinic on 07/20/22. Reported when starting to walk, has to stop after just a few steps due to chest pressure, feels like her heart starts to quiver when she walks, DOE, and presyncope. Says she feels like everything is going to blackout if she does not stop. No syncope. Daughter reports this is a significant change over the past 2-3 months. Seen by pulmonary and not felt to have significant change in lung function. Uses nebulizer twice daily for COPD. Weight stable, fluctuates only 1-2 lbs on occasion. She denied lower extremity edema, palpitations, melena, hematuria, hemoptysis, diaphoresis, weakness, orthopnea, and PND. Due to concern for unstable  angina, through shared decision making, she proceeded with left heart cath which revealed nonobstructive CAD with ramus intermedius with 50-60% narrowing in proximal vessel, right dominance, normal LVEDP, normal LV function. Had redness, flushing the day following cath despite premedication for dye allergy. Cardiac monitor shows predominant sinus rhythm, multiple runs of supraventricular tachycardia, the longest lasting 19 seconds. No triggered events with these. Due to asthma, as needed diltiazem 30 mg as needed for palpitations was ordered.   Today, she is here for follow-up of her recent cardiac cath and cardiac monitor results. She had 47 episodes of SVT on monitor.  She continues to feel palpitations She was given diltiazem 30 mg for palpitations, but has not started taking it  Past Medical History:  Diagnosis Date   COPD (chronic obstructive pulmonary disease) ([place])    Peripheral vascular disease ([place])     Past Surgical History:  Procedure Laterality Date   ABDOMINAL HYSTERECTOMY  1980   BACK SURGERY  07/2011   synovial cyst removal   INTRAOPERATIVE ARTERIOGRAM Right 04/28/2020   Procedure: INTRA OPERATIVE ARTERIOGRAM;  Surgeon: Angelia Mould, MD;  Location: [facility] OR;  Service: Vascular;  Laterality: Right;   LEFT HEART CATH AND CORONARY ANGIOGRAPHY N/A 07/22/2022   Procedure: LEFT HEART CATH AND CORONARY ANGIOGRAPHY;  Surgeon: Belva Crome, MD;  Location: [place];  Service: Cardiovascular;  Laterality: N/A;   THROMBECTOMY OF BYPASS GRAFT FEMORAL- POPLITEAL ARTERY Right 04/28/2020   Procedure: RIGHT FEMORAL-BELOW KNEE POPLITEAL BYPASS GRAFT USING NON-REVERSED GREATER SAPHENOUS VEIN GRAFT HARVESTED FROM RIGHT UPPER LEG. ;  Surgeon: Scot Dock,  Judeth Cornfield, MD;  Location: [facility] OR;  Service: Vascular;  Laterality: Right;   TUBAL LIGATION  1970    Current Medications: Current Meds  Medication Sig   Aspirin 81 MG CAPS Take 81 mg by mouth daily.   atorvastatin (LIPITOR) 40 MG  tablet TAKE 1 TABLET BY MOUTH DAILY AT BEDTIME   budesonide (PULMICORT) 0.25 MG/2ML nebulizer solution USE 2 VIALS VIA NEBULIZER TWICE DAILY   clonazePAM (KLONOPIN) 0.5 MG tablet Take 0.25 mg by mouth at bedtime.   diltiazem (CARDIZEM CD) 120 MG 24 hr capsule Take 1 capsule (120 mg total) by mouth daily.   diphenhydrAMINE (BENADRYL) 25 MG tablet Take 50 mg by mouth daily as needed (bee stings).   donepezil (ARICEPT) 5 MG tablet Take 5 mg by mouth daily.   estradiol (ESTRACE) 0.1 MG/GM vaginal cream Place 1 Applicatorful vaginally every Thursday.   famotidine (PEPCID) 20 MG tablet One after supper   furosemide (LASIX) 20 MG tablet Take 20 mg by mouth 2 (two) times daily.   ibuprofen (ADVIL) 200 MG tablet Take 200-400 mg by mouth every 6 (six) hours as needed for moderate pain.   ipratropium-albuterol (DUONEB) 0.5-2.5 (3) MG/3ML SOLN Take 3 mLs by nebulization in the morning and at bedtime.   levothyroxine (SYNTHROID, LEVOTHROID) 112 MCG tablet Take 112 mcg by mouth daily.   losartan (COZAAR) 25 MG tablet Take 25 mg by mouth daily.   mirtazapine (REMERON) 15 MG tablet Take 15 mg by mouth at bedtime.   montelukast (SINGULAIR) 10 MG tablet Take 10 mg by mouth daily.   nitroGLYCERIN (NITROSTAT) 0.4 MG SL tablet Place 1 tablet (0.4 mg total) under the tongue every 5 (five) minutes as needed for chest pain.   pantoprazole (PROTONIX) 40 MG tablet Take 1 tablet (40 mg total) by mouth daily. Take 30-60 min before first meal of the day   Vitamin D, Ergocalciferol, (DRISDOL) 1.25 MG (50000 UNIT) CAPS capsule Take 50,000 Units by mouth every Sunday.   [DISCONTINUED] diltiazem (CARDIZEM) 30 MG tablet Take 1 tablet by mouth once daily as needed for palpitations     Allergies:   Betadine [povidone iodine], Fluconazole in dextrose, Gatifloxacin, Iodinated contrast media, Ioxaglate, Quinolones, Tequin, Alendronate, Bee venom, Tramadol, Zithromax [azithromycin], and Codeine   Social History   Socioeconomic  History   Marital status: Widowed    Spouse name: Not on file   Number of children: Not on file   Years of education: Not on file   Highest education level: Not on file  Occupational History   Occupation: retired  Tobacco Use   Smoking status: Former    Packs/day: 1.00    Years: 30.00    Total pack years: 30.00    Types: Cigarettes    Quit date: 11/03/2003    Years since quitting: 18.8   Smokeless tobacco: Never  Vaping Use   Vaping Use: Never used  Substance and Sexual Activity   Alcohol use: No   Drug use: No   Sexual activity: Never  Other Topics Concern   Not on file  Social History Narrative   Not on file   Social Determinants of Health   Financial Resource Strain: Not on file  Food Insecurity: Not on file  Transportation Needs: Not on file  Physical Activity: Not on file  Stress: Not on file  Social Connections: Not on file     Family History: The patient's family history includes Asthma in her father; Heart disease in her father; Lung cancer in  her mother; Rheum arthritis in her mother.  ROS:   Please see the history of present illness.    + palpitations All other systems reviewed and are negative.  Labs/Other Studies Reviewed:    The following studies were reviewed today:  Cardiac monitor 08/20/22  Patient had a min HR of 49 bpm, max HR of 190 bpm, and avg HR of 64 bpm. Predominant underlying rhythm was Sinus Rhythm. Slight P wave morphology changes were noted. 47 Supraventricular Tachycardia runs occurred, the run with the fastest interval lasting  4 beats with a max rate of 190 bpm, the longest lasting 19.2 secs with an avg rate of 109 bpm. Run of VEs was detected within +/- 45 seconds of symptomatic patient event(s). Isolated SVEs were occasional (1.4%, 17383), SVE Couplets were rare (<1.0%,  234), and SVE Triplets were rare (<1.0%, 34). Isolated VEs were rare (<1.0%, 2574), VE Couplets were rare (<1.0%, 1), and VE Triplets were rare (<1.0%, 1).     LHC 07/23/22  Right dominant coronary anatomy with no obstructive coronary disease noted. Ramus intermedius contains a proximal 50 to 60% narrowing. Normal LVEDP.  Normal systolic function with EF greater than 55%.     RECOMMENDATIONS:   Clinical correlation is required.  Consider arrhythmia.    Echo 03/04/22  1. Left ventricular ejection fraction by 3D volume is 73 %. The left  ventricle has normal function. The left ventricle has no regional wall  motion abnormalities. There is mild concentric left ventricular  hypertrophy. Left ventricular diastolic  parameters were normal.   2. Right ventricular systolic function is normal. The right ventricular  size is normal.   3. The mitral valve is normal in structure. Mild mitral valve  regurgitation. No evidence of mitral stenosis.   4. The aortic valve is normal in structure. Aortic valve regurgitation is  mild. Aortic valve sclerosis is present, with no evidence of aortic valve  stenosis. Aortic regurgitation PHT measures 712 msec.   5. There is borderline dilatation of the aortic root, measuring 37 mm.   6. The inferior vena cava is normal in size with greater than 50%  respiratory variability, suggesting right atrial pressure of 3 mmHg.   Carotid Duplex 07/06/21  Right Carotid: Velocities in the right ICA are consistent with a 1-39%  stenosis.                Non-hemodynamically significant plaque <50% noted in the  CCA.                Stable RICA velocities.   Left Carotid: Velocities in the left ICA are consistent with a 1-39%  stenosis.               Stable LICA velocities.   Vertebrals:  Bilateral vertebral arteries demonstrate antegrade flow.  Subclavians: Left subclavian artery flow was disturbed. Normal flow  hemodynamics              were seen in the right subclavian artery.   *See table(s) above for measurements and observations.   Cardiac monitor 06/11/21  No significant arrhythmias NSR No arrhythmias with  symptoms of fluttering/dyspnea   Recent Labs: 07/20/2022: BUN 25; Creatinine, Ser 0.84; Hemoglobin 11.6; Platelets 176; Potassium 3.9; Sodium 137  Recent Lipid Panel    Component Value Date/Time   CHOL 165 07/20/2022 1000   TRIG 114 07/20/2022 1000   HDL 95 07/20/2022 1000   CHOLHDL 1.7 07/20/2022 1000   CHOLHDL 1.8 04/29/2020 0312   VLDL  10 04/29/2020 0312   LDLCALC 51 07/20/2022 1000     Risk Assessment/Calculations:      Physical Exam:    VS:  BP (!) 144/64   Pulse 72   Ht '5\' 2"'$  (1.575 m)   Wt 172 lb 3.2 oz (78.1 kg)   SpO2 98%   BMI 31.50 kg/m     Wt Readings from Last 3 Encounters:  08/28/22 172 lb 3.2 oz (78.1 kg)  08/17/22 171 lb (77.6 kg)  07/22/22 167 lb (75.8 kg)     GEN:  Well nourished, well developed in no acute distress HEENT: Normal NECK: No JVD; No carotid bruits CARDIAC: RRR, no murmurs, rubs, gallops RESPIRATORY:  Clear to auscultation without rales, wheezing or rhonchi  ABDOMEN: Soft, non-tender, non-distended MUSCULOSKELETAL:  No edema; No deformity. 2+  pedal pulses, equal bilaterally SKIN: Warm and dry. Right radial cath site well healed NEUROLOGIC:  Alert and oriented x 3 PSYCHIATRIC:  Normal affect   EKG:  EKG is not ordered today  Diagnoses:    1. Palpitations   2. Paroxysmal SVT (supraventricular tachycardia)   3. Essential hypertension   4. Chronic obstructive pulmonary disease, unspecified COPD type ([place])   5. Hyperlipidemia LDL goal <70   6. Bilateral carotid artery stenosis   7. Coronary artery disease involving native coronary artery of native heart without angina pectoris     Assessment and Plan:     Palpitations/Paroxsymal SVT: She continues to have palpitations on a consistent basis.  Has had a few episodes of presyncope, no syncope.  Has not started diltiazem 30 mg.  We will continue this dose for as needed palpitations. We will have her start diltiazem 120 mg daily. Advised her to continue to monitor home BP and to  notify us with any concerns.  Instructed her on Valsalva maneuvers and printed information given to her.   Nonobstructive CAD without angina: Left heart cath done in the setting of chest pressure, DOE, and presyncope felt to be unstable angina.  Mild nonobstructive disease in ramus intermedius. Is less anxious about symptoms of chronic dyspnea and palpitations since knowing there is no coronary blockage. Continue good cholesterol control.  Hypertension: BP is elevated today.  She reports recent home readings elevated as well.  We are starting diltiazem 120 mg daily in the setting of PSVT. Advised her to continue to monitor and report abnormalities prior to next office visit.   Carotid artery disease: Bilateral carotid artery stenosis (1-39%) on carotid duplex 07/2021. Recommendation to repeat ultrasound 2 years from previous (due 07/2023).   Hyperlipidemia: LDL 51 on 07/20/22. Continue atorvastatin.   COPD: Feels that her chronic dyspnea is stable. Avoiding beta-blockers for SVT at this time. Management per pulmonology.   Disposition: 3-4 months with Dr. Johnsie Cancel or APP  Medication Adjustments/Labs and Tests Ordered: Current medicines are reviewed at length with the patient today.  Concerns regarding medicines are outlined above.  No orders of the defined types were placed in this encounter.  Meds ordered this encounter  Medications   diltiazem (CARDIZEM CD) 120 MG 24 hr capsule    Sig: Take 1 capsule (120 mg total) by mouth daily.    Dispense:  30 capsule    Refill:  2    Additional dosage.   diltiazem (CARDIZEM) 30 MG tablet    Sig: Take 1 tablet by mouth once daily as needed for palpitations    Dispense:  30 tablet    Refill:  11    Patient Instructions  Medication Instructions:  Your physician has recommended you make the following change in your medication:   1) START diltiazem (Cardizem) '120mg'$  daily  *If you need a refill on your cardiac medications before your next appointment,  please call your pharmacy*  Lab Work: NONE  Testing/Procedures: NONE  Follow-Up: At [facility], you and your health needs are our priority.  As part of our continuing mission to provide you with exceptional heart care, we have created designated Provider Care Teams.  These Care Teams include your primary Cardiologist (physician) and Advanced Practice Providers (APPs -  Physician Assistants and Nurse Practitioners) who all work together to provide you with the care you need, when you need it.  Your next appointment:   3 month(s)  The format for your next appointment:   In Person  Provider:   Jenkins Rouge, MD  or Christen Bame, NP    Important Information About Sugar         Signed, Emmaline Life, NP  08/28/2022 12:49 PM    [place]

## 2022-08-28 ENCOUNTER — Encounter: Payer: Self-pay | Admitting: Nurse Practitioner

## 2022-08-28 ENCOUNTER — Ambulatory Visit: Payer: Medicare Other | Attending: Nurse Practitioner | Admitting: Nurse Practitioner

## 2022-08-28 VITALS — BP 144/64 | HR 72 | Ht 62.0 in | Wt 172.2 lb

## 2022-08-28 DIAGNOSIS — I2510 Atherosclerotic heart disease of native coronary artery without angina pectoris: Secondary | ICD-10-CM

## 2022-08-28 DIAGNOSIS — E785 Hyperlipidemia, unspecified: Secondary | ICD-10-CM

## 2022-08-28 DIAGNOSIS — J449 Chronic obstructive pulmonary disease, unspecified: Secondary | ICD-10-CM | POA: Diagnosis not present

## 2022-08-28 DIAGNOSIS — I10 Essential (primary) hypertension: Secondary | ICD-10-CM | POA: Diagnosis not present

## 2022-08-28 DIAGNOSIS — I6523 Occlusion and stenosis of bilateral carotid arteries: Secondary | ICD-10-CM

## 2022-08-28 DIAGNOSIS — R002 Palpitations: Secondary | ICD-10-CM

## 2022-08-28 DIAGNOSIS — I471 Supraventricular tachycardia, unspecified: Secondary | ICD-10-CM

## 2022-08-28 MED ORDER — DILTIAZEM HCL 30 MG PO TABS: ORAL_TABLET | ORAL | 11 refills | Status: DC

## 2022-08-28 MED ORDER — DILTIAZEM HCL ER COATED BEADS 120 MG PO CP24: 120.0000 mg | ORAL_CAPSULE | Freq: Every day | ORAL | 2 refills | Status: DC

## 2022-08-28 NOTE — Patient Instructions (Signed)
Medication Instructions:  Your physician has recommended you make the following change in your medication:   1) START diltiazem (Cardizem) '120mg'$  daily  *If you need a refill on your cardiac medications before your next appointment, please call your pharmacy*  Lab Work: NONE  Testing/Procedures: NONE  Follow-Up: At [facility], you and your health needs are our priority.  As part of our continuing mission to provide you with exceptional heart care, we have created designated Provider Care Teams.  These Care Teams include your primary Cardiologist (physician) and Advanced Practice Providers (APPs -  Physician Assistants and Nurse Practitioners) who all work together to provide you with the care you need, when you need it.  Your next appointment:   3 month(s)  The format for your next appointment:   In Person  Provider:   Jenkins Rouge, MD  or Christen Bame, NP    Important Information About Sugar

## 2022-09-25 ENCOUNTER — Other Ambulatory Visit: Payer: Self-pay | Admitting: Internal Medicine

## 2022-09-25 DIAGNOSIS — J4489 Other specified chronic obstructive pulmonary disease: Secondary | ICD-10-CM

## 2022-10-28 ENCOUNTER — Ambulatory Visit: Payer: Medicare Other | Admitting: Student

## 2022-10-28 ENCOUNTER — Telehealth: Payer: Self-pay | Admitting: Student

## 2022-10-28 ENCOUNTER — Encounter: Payer: Self-pay | Admitting: Student

## 2022-10-28 VITALS — BP 138/74 | HR 86 | Temp 97.7°F | Ht 62.0 in | Wt 172.8 lb

## 2022-10-28 DIAGNOSIS — J45901 Unspecified asthma with (acute) exacerbation: Secondary | ICD-10-CM | POA: Diagnosis not present

## 2022-10-28 DIAGNOSIS — J441 Chronic obstructive pulmonary disease with (acute) exacerbation: Secondary | ICD-10-CM | POA: Diagnosis not present

## 2022-10-28 MED ORDER — ARFORMOTEROL TARTRATE 15 MCG/2ML IN NEBU: 15.0000 ug | INHALATION_SOLUTION | Freq: Two times a day (BID) | RESPIRATORY_TRACT | 6 refills | Status: DC

## 2022-10-28 MED ORDER — SEREVENT DISKUS 50 MCG/ACT IN AEPB: 1.0000 | INHALATION_SPRAY | Freq: Two times a day (BID) | RESPIRATORY_TRACT | 0 refills | Status: DC

## 2022-10-28 MED ORDER — ARFORMOTEROL TARTRATE 15 MCG/2ML IN NEBU: 15.0000 ug | INHALATION_SOLUTION | Freq: Two times a day (BID) | RESPIRATORY_TRACT | 11 refills | Status: DC

## 2022-10-28 MED ORDER — BUDESONIDE 0.5 MG/2ML IN SUSP: 0.5000 mg | Freq: Two times a day (BID) | RESPIRATORY_TRACT | 12 refills | Status: DC

## 2022-10-28 MED ORDER — PREDNISONE 10 MG PO TABS: ORAL_TABLET | ORAL | 0 refills | Status: DC

## 2022-10-28 MED ORDER — DOXYCYCLINE HYCLATE 100 MG PO TABS: 100.0000 mg | ORAL_TABLET | Freq: Two times a day (BID) | ORAL | 0 refills | Status: DC

## 2022-10-28 NOTE — Telephone Encounter (Signed)
Sent brovana to [facility]. While she is waiting for it she can use serevent 1 puff twice daily in its place (this appears to be preferred LABA with her insurance). I sent 1 month prescription of it to [facility].

## 2022-10-28 NOTE — Patient Instructions (Addendum)
-   START doxycycline 1 tablet twice daily for 7 days - START prednisone taper - START brovana (formoterol) neb twice daily before budesonide neb - INCREASE dose of budesonide to 0.5 twice daily - duoneb (ipratropium-albuterol) use ONLY every 6 hours as needed - see you in clinic in 8 weeks or sooner if need be!

## 2022-10-28 NOTE — Addendum Note (Signed)
Addended by: Maryjane Hurter on: 10/28/2022 05:04 PM   Modules accepted: Orders

## 2022-10-28 NOTE — Progress Notes (Signed)
Synopsis: Referred for ACOS by Hayden Rasmussen, MD  Subjective:   PATIENT ID: Sharon Mcdaniel GENDER: female DOB: 08/07/1936, MRN: [id number]  Chief Complaint  Patient presents with   Acute Visit    Cough and wheeze SOB and dyspnea x 4 days.  Patient had prednisone from Dr. Melvyn Novas to take if needed.  Also had cephalexin 500 mg on hand and began both of these x 2 days.   86yF with history of ACOS followed by Dr. Melvyn Novas, PVD  She is on duoneb bid, pulmicort bid, GERD tx  She has been started on diltiazem for recurrent SVT/palpitations  No fever. Has been sick since about Sunday. Has had a couple days of prednisone 20 mg and keflex 500 mg.     Otherwise pertinent review of systems is negative.  Past Medical History:  Diagnosis Date   COPD (chronic obstructive pulmonary disease) ([place])    Peripheral vascular disease ([place])      Family History  Problem Relation Age of Onset   Lung cancer Mother    Rheum arthritis Mother    Asthma Father    Heart disease Father      Past Surgical History:  Procedure Laterality Date   ABDOMINAL HYSTERECTOMY  1980   BACK SURGERY  07/2011   synovial cyst removal   INTRAOPERATIVE ARTERIOGRAM Right 04/28/2020   Procedure: INTRA OPERATIVE ARTERIOGRAM;  Surgeon: Angelia Mould, MD;  Location: [facility] OR;  Service: Vascular;  Laterality: Right;   LEFT HEART CATH AND CORONARY ANGIOGRAPHY N/A 07/22/2022   Procedure: LEFT HEART CATH AND CORONARY ANGIOGRAPHY;  Surgeon: Belva Crome, MD;  Location: [facility];  Service: Cardiovascular;  Laterality: N/A;   THROMBECTOMY OF BYPASS GRAFT FEMORAL- POPLITEAL ARTERY Right 04/28/2020   Procedure: RIGHT FEMORAL-BELOW KNEE POPLITEAL BYPASS GRAFT USING NON-REVERSED GREATER SAPHENOUS VEIN GRAFT HARVESTED FROM RIGHT UPPER LEG. ;  Surgeon: Angelia Mould, MD;  Location: [place];  Service: Vascular;  Laterality: Right;   TUBAL LIGATION  1970    Social History   Socioeconomic History   Marital status:  Widowed    Spouse name: Not on file   Number of children: Not on file   Years of education: Not on file   Highest education level: Not on file  Occupational History   Occupation: retired  Tobacco Use   Smoking status: Former    Packs/day: 1.00    Years: 30.00    Total pack years: 30.00    Types: Cigarettes    Quit date: 11/03/2003    Years since quitting: 18.9   Smokeless tobacco: Never  Vaping Use   Vaping Use: Never used  Substance and Sexual Activity   Alcohol use: No   Drug use: No   Sexual activity: Never  Other Topics Concern   Not on file  Social History Narrative   Not on file   Social Determinants of Health   Financial Resource Strain: Not on file  Food Insecurity: Not on file  Transportation Needs: Not on file  Physical Activity: Not on file  Stress: Not on file  Social Connections: Not on file  Intimate Partner Violence: Not on file     Allergies  Allergen Reactions   Betadine [Povidone Iodine] Rash   Fluconazole In Dextrose Hives and Shortness Of Breath   Gatifloxacin Hives and Shortness Of Breath   Iodinated Contrast Media Hives, Shortness Of Breath and Rash    States she tolerates betadine   Ioxaglate Hives and Shortness  Of Breath   Quinolones Hives and Shortness Of Breath   Tequin Hives and Shortness Of Breath   Alendronate     tingling   Bee Venom Hives   Tramadol     jittery   Zithromax [Azithromycin]     GI Upset   Codeine Other (See Comments)    Makes her jittery      Outpatient Medications Prior to Visit  Medication Sig Dispense Refill   Aspirin 81 MG CAPS Take 81 mg by mouth daily.     atorvastatin (LIPITOR) 40 MG tablet TAKE 1 TABLET BY MOUTH DAILY AT BEDTIME 90 tablet 3   clonazePAM (KLONOPIN) 0.5 MG tablet Take 0.25 mg by mouth at bedtime.     diltiazem (CARDIZEM CD) 120 MG 24 hr capsule Take 1 capsule (120 mg total) by mouth daily. 30 capsule 2   diltiazem (CARDIZEM) 30 MG tablet Take 1 tablet by mouth once daily as needed for  palpitations 30 tablet 11   diphenhydrAMINE (BENADRYL) 25 MG tablet Take 50 mg by mouth daily as needed (bee stings).     donepezil (ARICEPT) 5 MG tablet Take 5 mg by mouth daily.     estradiol (ESTRACE) 0.1 MG/GM vaginal cream Place 1 Applicatorful vaginally every Thursday.     famotidine (PEPCID) 20 MG tablet One after supper 30 tablet 11   furosemide (LASIX) 20 MG tablet Take 20 mg by mouth 2 (two) times daily.     ibuprofen (ADVIL) 200 MG tablet Take 200-400 mg by mouth every 6 (six) hours as needed for moderate pain.     ipratropium-albuterol (DUONEB) 0.5-2.5 (3) MG/3ML SOLN Take 3 mLs by nebulization in the morning and at bedtime. 360 mL 11   levothyroxine (SYNTHROID, LEVOTHROID) 112 MCG tablet Take 112 mcg by mouth daily.     losartan (COZAAR) 25 MG tablet Take 25 mg by mouth daily.     meloxicam (MOBIC) 7.5 MG tablet Take 7.5 mg by mouth daily.     mirtazapine (REMERON) 15 MG tablet Take 15 mg by mouth at bedtime.     montelukast (SINGULAIR) 10 MG tablet Take 10 mg by mouth daily.     nitroGLYCERIN (NITROSTAT) 0.4 MG SL tablet Place 1 tablet (0.4 mg total) under the tongue every 5 (five) minutes as needed for chest pain. 25 tablet 3   pantoprazole (PROTONIX) 40 MG tablet TAKE 1 TABLET BY MOUTH DAILY. TAKE 30-60 MINUTES BEFORE FIRST MEAL OF THE DAY 30 tablet 2   predniSONE (DELTASONE) 10 MG tablet Take 10 mg by mouth daily with breakfast.     Vitamin D, Ergocalciferol, (DRISDOL) 1.25 MG (50000 UNIT) CAPS capsule Take 50,000 Units by mouth every Sunday.     budesonide (PULMICORT) 0.25 MG/2ML nebulizer solution USE 2 VIALS VIA NEBULIZER TWICE DAILY 120 mL 12   No facility-administered medications prior to visit.       Objective:   Physical Exam:  General appearance: 86 y.o., female, NAD, conversant  Eyes: anicteric sclerae; PERRL, tracking appropriately HENT: NCAT; MMM Neck: Trachea midline; no lymphadenopathy, no JVD Lungs: faint wheeze and rhonchi bl, with normal respiratory  effort CV: RRR, no murmur  Abdomen: Soft, non-tender; non-distended, BS present  Extremities: No peripheral edema, warm Skin: Normal turgor and texture; no rash Psych: Appropriate affect Neuro: Alert and oriented to person and place, no focal deficit     Vitals:   10/28/22 1304  BP: 138/74  Pulse: 86  Temp: 97.7 F (36.5 C)  TempSrc: Oral  SpO2:  94%  Weight: 172 lb 12.8 oz (78.4 kg)  Height: '5\' 2"'$  (1.575 m)   94% on RA BMI Readings from Last 3 Encounters:  10/28/22 31.61 kg/m  08/28/22 31.50 kg/m  08/17/22 31.28 kg/m   Wt Readings from Last 3 Encounters:  10/28/22 172 lb 12.8 oz (78.4 kg)  08/28/22 172 lb 3.2 oz (78.1 kg)  08/17/22 171 lb (77.6 kg)     CBC    Component Value Date/Time   WBC 15.0 (H) 07/20/2022 1000   WBC 15.0 (H) 04/30/2020 0316   RBC 3.99 07/20/2022 1000   RBC 3.59 (L) 04/30/2020 0316   HGB 11.6 07/20/2022 1000   HCT 35.5 07/20/2022 1000   PLT 176 07/20/2022 1000   MCV 89 07/20/2022 1000   MCH 29.1 07/20/2022 1000   MCH 28.4 04/30/2020 0316   MCHC 32.7 07/20/2022 1000   MCHC 32.2 04/30/2020 0316   RDW 15.8 (H) 07/20/2022 1000   LYMPHSABS 1.5 05/06/2021 1255   MONOABS 0.9 06/10/2016 1712   EOSABS 0.1 05/06/2021 1255   BASOSABS 0.1 05/06/2021 1255    Eos 0-100  Chest Imaging: None new  Pulmonary Functions Testing Results:    Latest Ref Rng & Units 07/08/2016    2:41 PM  PFT Results  FVC-Pre L 1.87   FVC-Predicted Pre % 76   FVC-Post L 1.96   FVC-Predicted Post % 79   Pre FEV1/FVC % % 70   Post FEV1/FCV % % 74   FEV1-Pre L 1.32   FEV1-Predicted Pre % 72   FEV1-Post L 1.45   DLCO uncorrected ml/min/mmHg 11.23   DLCO UNC% % 52   DLVA Predicted % 71   TLC L 4.37   TLC % Predicted % 91   RV % Predicted % 112       Echocardiogram 03/04/22:    1. Left ventricular ejection fraction by 3D volume is 73 %. The left  ventricle has normal function. The left ventricle has no regional wall  motion abnormalities. There is mild  concentric left ventricular  hypertrophy. Left ventricular diastolic  parameters were normal.   2. Right ventricular systolic function is normal. The right ventricular  size is normal.   3. The mitral valve is normal in structure. Mild mitral valve  regurgitation. No evidence of mitral stenosis.   4. The aortic valve is normal in structure. Aortic valve regurgitation is  mild. Aortic valve sclerosis is present, with no evidence of aortic valve  stenosis. Aortic regurgitation PHT measures 712 msec.   5. There is borderline dilatation of the aortic root, measuring 37 mm.   6. The inferior vena cava is normal in size with greater than 50%  respiratory variability, suggesting right atrial pressure of 3 mmHg.   Heart Catheterization 07/2022:   CONCLUSIONS: Right dominant coronary anatomy with no obstructive coronary disease noted. Ramus intermedius contains a proximal 50 to 60% narrowing. Normal LVEDP charted but was 19.  Normal systolic function with EF greater than 55%.    Assessment & Plan:   # ACOS in exacerbation  Plan: - START doxycycline 1 tablet twice daily for 7 days - START prednisone taper - START brovana (formoterol) neb twice daily before budesonide neb - INCREASE dose of budesonide to 0.5 twice daily - duoneb (ipratropium-albuterol) use ONLY every 6 hours as needed - see you in clinic in 8 weeks or sooner if need be!    Maryjane Hurter, MD [facility] 10/28/2022 1:18 PM

## 2022-10-28 NOTE — Telephone Encounter (Signed)
Called and spoke with patient's daughter, Jacqulyn Liner [facility]), she states that they have been unable to get the Brovana neb filled anywhere locally.  Their pharmacy has to bill it has Medicare part B and are unable to bill part B.  I advised her that we can send it to [facility] which is a mail order pharmacy.  She was concerned that it would take to long to get to her.  She was wondering if there was something else he would want to prescribe or is it ok to wait for it to come in the mail.  I advised her that I would send him a message and once we hear back from him we will call her back.  She verbalized understanding.  Dr. Verlee Monte, please advise if there is something else you want to prescribe this patient in place of the Brovana as she cannot get it filled locally or should we send the script to [facility]?  Thank you.

## 2022-11-03 ENCOUNTER — Telehealth: Payer: Self-pay | Admitting: Internal Medicine

## 2022-11-03 NOTE — Telephone Encounter (Signed)
Called and spoke with pt letting her know recs per MW and she verbalized understanding. Nothing further needed. 

## 2022-11-03 NOTE — Telephone Encounter (Signed)
Called and spoke with pt who states she is currently having problems with a lot of drainage that is causing her to cough.  States her chest is better after the recent abx and prednisone.  Pt said when she coughs, she is getting up white foamy phlegm and states when she blows her nose, the color is also clear.  Due to her current symptoms, pt is requesting to have a decongestant prescribed or wants to know what could be recommended to help with her symptoms. Dr. Melvyn Novas, please advise.

## 2022-11-03 NOTE — Telephone Encounter (Signed)
Mucinex D  (you have to sign for it at pharmacy desk) with f/u next week of not improving as this is not a longterm solution

## 2022-11-03 NOTE — Telephone Encounter (Signed)
PT states we recently called in meds for her drainage symptoms and now she would like a decongestant called in because she feels her congestion may be aggravating other issues such as coughing.   Feels like head and nose are making her drain through her throat and causing  her cough. She blows clear and yellow both.    Pharm: [facility]

## 2022-11-13 ENCOUNTER — Other Ambulatory Visit: Payer: Self-pay

## 2022-11-13 MED ORDER — DILTIAZEM HCL ER COATED BEADS 120 MG PO CP24: 120.0000 mg | ORAL_CAPSULE | Freq: Every day | ORAL | 2 refills | Status: DC

## 2022-11-15 NOTE — Progress Notes (Signed)
Cardiology Office Note:    Date:  11/27/2022   ID:  Sharon Mcdaniel, DOB 02/25/1936, MRN [id number]  PCP:  Hayden Rasmussen, MD   [facility] Providers Cardiologist:  Jenkins Rouge, MD     Referring MD: Hayden Rasmussen, MD   Chief Complaint: follow-up SVT  History of Present Illness:    Sharon Mcdaniel is a very pleasant 87 y.o. female with a hx of mild MR, mild AI, former tobacco abuse, COPD, palpitations, carotid artery disease, hyperlipidemia, PVD s/p right femoral-popliteal artery bypass 04/2020, and known tibial artery occlusive disease Follows with Dr Doren Custard from [facility]  Cardiac w/u has been benign She has had fatigue , palpitations and chest pressure  Monitor  08/20/22  benign some self limited atrial tachycardia longest 19 beats PRN cardizem  Echo 03/04/22 EF 73% mild MR/AR Cath 07/23/22 only 50% IM branch stenosis noted filling pressures and EDP were normal   Sees pulmonary for COPD/Asthma Uses Brovana, Pulmicort and Duoneb 10/28/22 with URI Rx with prednisone and doxycycline     Past Medical History:  Diagnosis Date   COPD (chronic obstructive pulmonary disease) ([place])    Peripheral vascular disease ([place])     Past Surgical History:  Procedure Laterality Date   [place] SURGERY  07/2011   synovial cyst removal   INTRAOPERATIVE ARTERIOGRAM Right 04/28/2020   Procedure: INTRA OPERATIVE ARTERIOGRAM;  Surgeon: Angelia Mould, MD;  Location: [facility] OR;  Service: Vascular;  Laterality: Right;   LEFT HEART CATH AND CORONARY ANGIOGRAPHY N/A 07/22/2022   Procedure: LEFT HEART CATH AND CORONARY ANGIOGRAPHY;  Surgeon: Belva Crome, MD;  Location: [facility];  Service: Cardiovascular;  Laterality: N/A;   THROMBECTOMY OF BYPASS GRAFT FEMORAL- POPLITEAL ARTERY Right 04/28/2020   Procedure: RIGHT FEMORAL-BELOW KNEE POPLITEAL BYPASS GRAFT USING NON-REVERSED GREATER SAPHENOUS VEIN GRAFT HARVESTED FROM RIGHT UPPER LEG. ;  Surgeon: Angelia Mould, MD;  Location: [facility] OR;  Service: Vascular;  Laterality: Right;   TUBAL LIGATION  1970    Current Medications: Current Meds  Medication Sig   arformoterol (BROVANA) 15 MCG/2ML NEBU Take 2 mLs (15 mcg total) by nebulization 2 (two) times daily.   Aspirin 81 MG CAPS Take 81 mg by mouth daily.   atorvastatin (LIPITOR) 40 MG tablet TAKE 1 TABLET BY MOUTH DAILY AT BEDTIME   budesonide (PULMICORT) 0.5 MG/2ML nebulizer solution Take 2 mLs (0.5 mg total) by nebulization in the morning and at bedtime.   clonazePAM (KLONOPIN) 0.5 MG tablet Take 0.25 mg by mouth at bedtime.   diltiazem (CARDIZEM CD) 120 MG 24 hr capsule Take 1 capsule (120 mg total) by mouth daily.   diltiazem (CARDIZEM) 30 MG tablet Take 1 tablet by mouth once daily as needed for palpitations   diphenhydrAMINE (BENADRYL) 25 MG tablet Take 50 mg by mouth daily as needed (bee stings).   donepezil (ARICEPT) 5 MG tablet Take 5 mg by mouth daily.   estradiol (ESTRACE) 0.1 MG/GM vaginal cream Place 1 Applicatorful vaginally every Thursday.   famotidine (PEPCID) 20 MG tablet One after supper   furosemide (LASIX) 20 MG tablet Take 20 mg by mouth 2 (two) times daily.   ibuprofen (ADVIL) 200 MG tablet Take 200-400 mg by mouth every 6 (six) hours as needed for moderate pain.   ipratropium-albuterol (DUONEB) 0.5-2.5 (3) MG/3ML SOLN Take 3 mLs by nebulization in the morning and at bedtime.   levothyroxine (SYNTHROID, LEVOTHROID) 112 MCG tablet Take  112 mcg by mouth daily.   losartan (COZAAR) 25 MG tablet Take 25 mg by mouth daily.   meloxicam (MOBIC) 7.5 MG tablet Take 7.5 mg by mouth daily.   mirtazapine (REMERON) 15 MG tablet Take 15 mg by mouth at bedtime.   montelukast (SINGULAIR) 10 MG tablet Take 10 mg by mouth daily.   nitroGLYCERIN (NITROSTAT) 0.4 MG SL tablet Place 1 tablet (0.4 mg total) under the tongue every 5 (five) minutes as needed for chest pain.   pantoprazole (PROTONIX) 40 MG tablet TAKE 1 TABLET BY MOUTH DAILY. TAKE 30-60  MINUTES BEFORE FIRST MEAL OF THE DAY   Vitamin D, Ergocalciferol, (DRISDOL) 1.25 MG (50000 UNIT) CAPS capsule Take 50,000 Units by mouth every Sunday.     Allergies:   Betadine [povidone iodine], Fluconazole in dextrose, Gatifloxacin, Iodinated contrast media, Ioxaglate, Quinolones, Tequin, Alendronate, Bee venom, Tramadol, Zithromax [azithromycin], and Codeine   Social History   Socioeconomic History   Marital status: Widowed    Spouse name: Not on file   Number of children: Not on file   Years of education: Not on file   Highest education level: Not on file  Occupational History   Occupation: retired  Tobacco Use   Smoking status: Former    Packs/day: 1.00    Years: 30.00    Total pack years: 30.00    Types: Cigarettes    Quit date: 11/03/2003    Years since quitting: 19.0   Smokeless tobacco: Never  Vaping Use   Vaping Use: Never used  Substance and Sexual Activity   Alcohol use: No   Drug use: No   Sexual activity: Never  Other Topics Concern   Not on file  Social History Narrative   Not on file   Social Determinants of Health   Financial Resource Strain: Not on file  Food Insecurity: Not on file  Transportation Needs: Not on file  Physical Activity: Not on file  Stress: Not on file  Social Connections: Not on file     Family History: The patient's family history includes Asthma in her father; Heart disease in her father; Lung cancer in her mother; Rheum arthritis in her mother.  ROS:   Please see the history of present illness.    + palpitations All other systems reviewed and are negative.  Labs/Other Studies Reviewed:    The following studies were reviewed today:  Cardiac monitor 08/20/22  Patient had a min HR of 49 bpm, max HR of 190 bpm, and avg HR of 64 bpm. Predominant underlying rhythm was Sinus Rhythm. Slight P wave morphology changes were noted. 47 Supraventricular Tachycardia runs occurred, the run with the fastest interval lasting  4 beats  with a max rate of 190 bpm, the longest lasting 19.2 secs with an avg rate of 109 bpm. Run of VEs was detected within +/- 45 seconds of symptomatic patient event(s). Isolated SVEs were occasional (1.4%, 17383), SVE Couplets were rare (<1.0%,  234), and SVE Triplets were rare (<1.0%, 34). Isolated VEs were rare (<1.0%, 2574), VE Couplets were rare (<1.0%, 1), and VE Triplets were rare (<1.0%, 1).    LHC 07/23/22  Right dominant coronary anatomy with no obstructive coronary disease noted. Ramus intermedius contains a proximal 50 to 60% narrowing. Normal LVEDP.  Normal systolic function with EF greater than 55%.     RECOMMENDATIONS:   Clinical correlation is required.  Consider arrhythmia.    Echo 03/04/22  1. Left ventricular ejection fraction by 3D volume is 73 %.  The left  ventricle has normal function. The left ventricle has no regional wall  motion abnormalities. There is mild concentric left ventricular  hypertrophy. Left ventricular diastolic  parameters were normal.   2. Right ventricular systolic function is normal. The right ventricular  size is normal.   3. The mitral valve is normal in structure. Mild mitral valve  regurgitation. No evidence of mitral stenosis.   4. The aortic valve is normal in structure. Aortic valve regurgitation is  mild. Aortic valve sclerosis is present, with no evidence of aortic valve  stenosis. Aortic regurgitation PHT measures 712 msec.   5. There is borderline dilatation of the aortic root, measuring 37 mm.   6. The inferior vena cava is normal in size with greater than 50%  respiratory variability, suggesting right atrial pressure of 3 mmHg.   Carotid Duplex 07/06/21  Right Carotid: Velocities in the right ICA are consistent with a 1-39%  stenosis.                Non-hemodynamically significant plaque <50% noted in the  CCA.                Stable RICA velocities.   Left Carotid: Velocities in the left ICA are consistent with a 1-39%   stenosis.               Stable LICA velocities.   Vertebrals:  Bilateral vertebral arteries demonstrate antegrade flow.  Subclavians: Left subclavian artery flow was disturbed. Normal flow  hemodynamics              were seen in the right subclavian artery.   *See table(s) above for measurements and observations.   Cardiac monitor 06/11/21  No significant arrhythmias NSR No arrhythmias with symptoms of fluttering/dyspnea   Recent Labs: 07/20/2022: BUN 25; Creatinine, Ser 0.84; Hemoglobin 11.6; Platelets 176; Potassium 3.9; Sodium 137  Recent Lipid Panel    Component Value Date/Time   CHOL 165 07/20/2022 1000   TRIG 114 07/20/2022 1000   HDL 95 07/20/2022 1000   CHOLHDL 1.7 07/20/2022 1000   CHOLHDL 1.8 04/29/2020 0312   VLDL 10 04/29/2020 0312   LDLCALC 51 07/20/2022 1000     Risk Assessment/Calculations:      Physical Exam:    VS:  BP (!) 112/54   Pulse 60   Ht '5\' 2"'$  (1.575 m)   Wt 169 lb 3.2 oz (76.7 kg)   SpO2 94%   BMI 30.95 kg/m     Wt Readings from Last 3 Encounters:  11/27/22 169 lb 3.2 oz (76.7 kg)  10/28/22 172 lb 12.8 oz (78.4 kg)  08/28/22 172 lb 3.2 oz (78.1 kg)     GEN:  Well nourished, well developed in no acute distress HEENT: Normal NECK: No JVD; No carotid bruits CARDIAC: RRR, no murmurs, rubs, gallops RESPIRATORY:  Clear to auscultation without rales, wheezing or rhonchi  ABDOMEN: Soft, non-tender, non-distended MUSCULOSKELETAL:  No edema; No deformity. 2+  pedal pulses, equal bilaterally SKIN: Warm and dry. Right radial cath site well healed NEUROLOGIC:  Alert and oriented x 3 PSYCHIATRIC:  Normal affect   EKG: SR rate 61 normal 07/20/22   Diagnoses:    No diagnosis found.   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  Mild  nonobstructive disease in ramus intermedius. Is less anxious about symptoms of chronic dyspnea and palpitations since knowing there is no coronary blockage. Continue good cholesterol control.  Hypertension: BP is elevated today.  She reports recent home readings elevated as well.  We are starting diltiazem 120 mg daily in the setting of PSVT. Advised her to continue to monitor and report abnormalities prior to next office visit.   Carotid artery disease: Bilateral carotid artery stenosis (1-39%) on carotid duplex 07/2021. Recommendation to repeat ultrasound 2 years from previous (due 07/2023).   Hyperlipidemia: LDL 51 on 07/20/22. Continue atorvastatin.   COPD: Feels that her chronic dyspnea is stable. Avoiding beta-blockers for SVT at this time. Management per pulmonology. Recent flare URI 10/28/22 Rx with prednisone taper and doxycycline   Disposition: F/U in a year   Medication Adjustments/Labs and Tests Ordered: Current medicines are reviewed at length with the patient today.  Concerns regarding medicines are outlined above.  No orders of the defined types were placed in this encounter.  No orders of the defined types were placed in this encounter.   Patient Instructions  Medication Instructions:  Your physician recommends that you continue on your current medications as directed. Please refer to the Current Medication list given to you today.  *If you need a refill on your cardiac medications before your next appointment, please call your pharmacy*  Lab Work: If you have labs (blood work) drawn today and your tests are completely normal, you will receive your results only by: [place] (if you have MyChart) OR A paper copy in the mail If you have any lab test that is abnormal or we need to change your treatment, we will call you to review the results.  Testing/Procedures: None ordered today.  Follow-Up: At [facility], you and your health needs are our priority.   As part of our continuing mission to provide you with exceptional heart care, we have created designated Provider Care Teams.  These Care Teams include your primary Cardiologist (physician) and Advanced Practice Providers (APPs -  Physician Assistants and Nurse Practitioners) who all work together to provide you with the care you need, when you need it.  We recommend signing up for the patient portal called "MyChart".  Sign up information is provided on this After Visit Summary.  MyChart is used to connect with patients for Virtual Visits (Telemedicine).  Patients are able to view lab/test results, encounter notes, upcoming appointments, etc.  Non-urgent messages can be sent to your provider as well.   To learn more about what you can do with MyChart, go to [email or web address].    Your next appointment:   1 year(s)  Provider:   Jenkins Rouge, MD       Signed, Jenkins Rouge, MD  11/27/2022 9:48 AM    [place]

## 2022-11-27 ENCOUNTER — Encounter: Payer: Self-pay | Admitting: Cardiovascular Disease

## 2022-11-27 ENCOUNTER — Ambulatory Visit: Payer: Medicare Other | Attending: Cardiovascular Disease | Admitting: Cardiovascular Disease

## 2022-11-27 VITALS — BP 112/54 | HR 60 | Ht 62.0 in | Wt 169.2 lb

## 2022-11-27 DIAGNOSIS — R002 Palpitations: Secondary | ICD-10-CM

## 2022-11-27 DIAGNOSIS — J449 Chronic obstructive pulmonary disease, unspecified: Secondary | ICD-10-CM

## 2022-11-27 DIAGNOSIS — I2510 Atherosclerotic heart disease of native coronary artery without angina pectoris: Secondary | ICD-10-CM | POA: Diagnosis not present

## 2022-11-27 DIAGNOSIS — E782 Mixed hyperlipidemia: Secondary | ICD-10-CM

## 2022-11-27 NOTE — Patient Instructions (Signed)
Medication Instructions:  Your physician recommends that you continue on your current medications as directed. Please refer to the Current Medication list given to you today.  *If you need a refill on your cardiac medications before your next appointment, please call your pharmacy*  Lab Work: If you have labs (blood work) drawn today and your tests are completely normal, you will receive your results only by: MyChart Message (if you have MyChart) OR A paper copy in the mail If you have any lab test that is abnormal or we need to change your treatment, we will call you to review the results.  Testing/Procedures: None ordered today.  Follow-Up: At [facility], you and your health needs are our priority.  As part of our continuing mission to provide you with exceptional heart care, we have created designated Provider Care Teams.  These Care Teams include your primary Cardiologist (physician) and Advanced Practice Providers (APPs -  Physician Assistants and Nurse Practitioners) who all work together to provide you with the care you need, when you need it.  We recommend signing up for the patient portal called "MyChart".  Sign up information is provided on this After Visit Summary.  MyChart is used to connect with patients for Virtual Visits (Telemedicine).  Patients are able to view lab/test results, encounter notes, upcoming appointments, etc.  Non-urgent messages can be sent to your provider as well.   To learn more about what you can do with MyChart, go to [email or web address].    Your next appointment:   1 year(s)  Provider:   Peter Nishan, MD      

## 2022-12-04 ENCOUNTER — Telehealth: Payer: Self-pay | Admitting: Internal Medicine

## 2022-12-04 NOTE — Telephone Encounter (Signed)
PT called with questions about RX's  issued by Dr. Verlee Monte when she saw him (Dr. Melvyn Novas was not avail at that time. He is her usual Dr.).She wanted to check with him that these meds are OK with him Pls call to advise @ [phone].   Appt in place but she has questions now.   arformoterol (BROVANA) 15 MCG/2ML NEBU [[id number]]   They called her this AM to refill and she said no, not until she speaks with her.

## 2022-12-04 NOTE — Telephone Encounter (Signed)
Called and spoke to patient and went over recommendations from Dr Melvyn Novas. She verbalized understanding and she states she will think about it and decide if she wants to continue the [place]. I told her to call and update [place] if she does decide to continue them. Nothing further needed

## 2022-12-04 NOTE — Telephone Encounter (Signed)
It's fine if affordable - basically similar to serevent so don't take both and be sure has f/u with me before refills the brovana to see if it's working for her and worth the cost

## 2022-12-04 NOTE — Telephone Encounter (Signed)
Called and spoke to patient and she is needing to know from Dr Melvyn Novas about the nebulizer medication that Dr Verlee Monte sent in for her. She is wondering if she needs to continue it or not. The medication was Brovana nebulizer solution  Please advise sir

## 2022-12-08 ENCOUNTER — Other Ambulatory Visit: Payer: Self-pay | Admitting: Family Medicine

## 2022-12-08 DIAGNOSIS — E2839 Other primary ovarian failure: Secondary | ICD-10-CM

## 2022-12-08 DIAGNOSIS — Z1231 Encounter for screening mammogram for malignant neoplasm of breast: Secondary | ICD-10-CM

## 2023-01-01 ENCOUNTER — Other Ambulatory Visit: Payer: Self-pay | Admitting: Internal Medicine

## 2023-01-01 DIAGNOSIS — J4489 Other specified chronic obstructive pulmonary disease: Secondary | ICD-10-CM

## 2023-01-21 ENCOUNTER — Encounter: Payer: Self-pay | Admitting: Nurse Practitioner

## 2023-01-22 ENCOUNTER — Telehealth: Payer: Self-pay | Admitting: Internal Medicine

## 2023-01-22 NOTE — Telephone Encounter (Signed)
John would like to know if patient's nebulizer solution are discontinued. John phone number is [phone].

## 2023-01-22 NOTE — Telephone Encounter (Signed)
Called and spoke with patient. She stated that she is not using her Brovana solution. She is only using the albuterol and Duoneb. She did not notice a difference when she was using the solution.   Called [facility] and spoke with [place]. She verbalized understanding.   Nothing further needed at time of call.

## 2023-02-11 ENCOUNTER — Ambulatory Visit: Payer: Medicare Other | Admitting: Adult Health

## 2023-02-11 ENCOUNTER — Encounter: Payer: Self-pay | Admitting: Adult Health

## 2023-02-11 VITALS — BP 130/50 | HR 74 | Temp 97.7°F | Ht 62.0 in | Wt 166.2 lb

## 2023-02-11 DIAGNOSIS — J309 Allergic rhinitis, unspecified: Secondary | ICD-10-CM | POA: Insufficient documentation

## 2023-02-11 DIAGNOSIS — J4489 Other specified chronic obstructive pulmonary disease: Secondary | ICD-10-CM | POA: Diagnosis not present

## 2023-02-11 DIAGNOSIS — J8410 Pulmonary fibrosis, unspecified: Secondary | ICD-10-CM

## 2023-02-11 DIAGNOSIS — Z23 Encounter for immunization: Secondary | ICD-10-CM

## 2023-02-11 NOTE — Assessment & Plan Note (Signed)
Previous CT chest has showed stable pleural-parenchymal scarring and Nodular consolidation in the right upper lobe dating back to 2005 consistent with a benign etiology.  Chest x-ray September 2023 stable changes.

## 2023-02-11 NOTE — Patient Instructions (Addendum)
Continue on Pulmicort /Ipratropium/Albuterol Neb Twice daily . Rinse after use.  Prevnar 20 vaccine .  Continue Singulair daily  Claritin 10mg  At bedtime  As needed   Follow up with Dr. [name] in 6 months and As needed   Please contact office for sooner follow up if symptoms do not improve or worsen or seek emergency care

## 2023-02-11 NOTE — Progress Notes (Signed)
@Patient  ID: Sharon Mcdaniel, female    DOB: 07-10-1936, 87 y.o.   MRN: [id number]  Chief Complaint  Patient presents with   Follow-up    Referring provider: Dois Davenport, MD  HPI: 87 year old female former smoker followed for moderate COPD and asthmatic bronchitis  TEST/EVENTS :  PFTs July 06, 2006   FEV1 of 58% predicted with a ratio of 56%  And 15% improvement after bronchodilator consistent with an asthmatic component.  Allergy profile 06/10/2016 >  Eos 0.1/  IgE 7 RAST neg  - Sinus CT 06/15/2016 > neg     - PFT's  07/08/2016  FEV1 1.45 (79 % ) ratio 74  p 10 % improvement from saba p duoneb > 6 h  prior to study with DLCO  52 % corrects to 71  % for alv volume     High-resolution CT chest October 2017 -Neg  for interstitial lung disease, biapical pleural-parenchymal scarring unchanged since 2005.  Subpleural nodular consolidation in the right upper lobe dating back to 2005 consistent with a benign etiology.   02/11/2023 Follow up : COPD and Asthmatic Bronchitis  Patient returns for a 14-month follow-up.  Patient is followed for COPD and asthmatic bronchitis.  She did have an exacerbation in December and was treated with antibiotics and steroids.  Patient says symptoms improved and she is back to baseline.  She was recommended to add a Brovon a nebulizer twice daily.  Patient says she took it for a while but did not feel like it was working as well.  She went back to using DuoNeb twice daily with her budesonide nebulizer twice daily.  Patient says overall she has been doing well.  She denies any flare of her cough or wheezing.  She remains fully independent.  Lives at home.  Her daughter lives with her.  She is able to drive, do her shopping and light cleaning.  She does walk short distances.  Does not exercise on a routine basis.  She denies any hemoptysis.  We discussed a Prevnar booster today. Chest x-ray September 2023 showed biapical pleural thickening otherwise clear. Says her  allergies have been doing okay.  Uses Singulair daily.  Uses Claritin on occasion   Allergies  Allergen Reactions   Betadine [Povidone Iodine] Rash   Fluconazole In Dextrose Hives and Shortness Of Breath   Gatifloxacin Hives and Shortness Of Breath   Iodinated Contrast Media Hives, Shortness Of Breath and Rash    States she tolerates betadine   Ioxaglate Hives and Shortness Of Breath   Quinolones Hives and Shortness Of Breath   Tequin Hives and Shortness Of Breath   Alendronate     tingling   Bee Venom Hives   Tramadol     jittery   Zithromax [Azithromycin]     GI Upset   Codeine Other (See Comments)    Makes her jittery     Immunization History  Administered Date(s) Administered   DTaP 11/06/2013   Fluad Quad(high Dose 65+) 07/03/2020, 08/08/2021   Influenza Whole 08/24/2006, 07/31/2016   Influenza, High Dose Seasonal PF 08/03/2015   Influenza,inj,quad, With Preservative 08/02/2017   Influenza-Unspecified 07/03/2014   PFIZER(Purple Top)SARS-COV-2 Vaccination 11/21/2019, 12/11/2019   Pneumococcal Polysaccharide-23 09/02/2004, 11/06/2013   Pneumococcal-Unspecified 11/06/2013   Tdap 11/06/2013    Past Medical History:  Diagnosis Date   COPD (chronic obstructive pulmonary disease)    Peripheral vascular disease     Tobacco History: Social History   Tobacco Use  Smoking Status  Former   Packs/day: 1.00   Years: 30.00   Additional pack years: 0.00   Total pack years: 30.00   Types: Cigarettes   Quit date: 11/03/2003   Years since quitting: 19.2  Smokeless Tobacco Never   Counseling given: Not Answered   Outpatient Medications Prior to Visit  Medication Sig Dispense Refill   Aspirin 81 MG CAPS Take 81 mg by mouth daily.     atorvastatin (LIPITOR) 40 MG tablet TAKE 1 TABLET BY MOUTH DAILY AT BEDTIME 90 tablet 3   budesonide (PULMICORT) 0.5 MG/2ML nebulizer solution Take 2 mLs (0.5 mg total) by nebulization in the morning and at bedtime. 120 mL 12   clonazePAM  (KLONOPIN) 0.5 MG tablet Take 0.25 mg by mouth at bedtime.     diltiazem (CARDIZEM CD) 120 MG 24 hr capsule Take 1 capsule (120 mg total) by mouth daily. 90 capsule 2   diltiazem (CARDIZEM) 30 MG tablet Take 1 tablet by mouth once daily as needed for palpitations 30 tablet 11   diphenhydrAMINE (BENADRYL) 25 MG tablet Take 50 mg by mouth daily as needed (bee stings).     donepezil (ARICEPT) 5 MG tablet Take 5 mg by mouth daily.     estradiol (ESTRACE) 0.1 MG/GM vaginal cream Place 1 Applicatorful vaginally every Thursday.     famotidine (PEPCID) 20 MG tablet One after supper 30 tablet 11   furosemide (LASIX) 20 MG tablet Take 20 mg by mouth 2 (two) times daily.     ibuprofen (ADVIL) 200 MG tablet Take 200-400 mg by mouth every 6 (six) hours as needed for moderate pain.     ipratropium-albuterol (DUONEB) 0.5-2.5 (3) MG/3ML SOLN Take 3 mLs by nebulization in the morning and at bedtime. 360 mL 11   levothyroxine (SYNTHROID, LEVOTHROID) 112 MCG tablet Take 112 mcg by mouth daily.     losartan (COZAAR) 25 MG tablet Take 25 mg by mouth daily.     meloxicam (MOBIC) 7.5 MG tablet Take 7.5 mg by mouth daily.     mirtazapine (REMERON) 15 MG tablet Take 15 mg by mouth at bedtime.     montelukast (SINGULAIR) 10 MG tablet Take 10 mg by mouth daily.     nitroGLYCERIN (NITROSTAT) 0.4 MG SL tablet Place 1 tablet (0.4 mg total) under the tongue every 5 (five) minutes as needed for chest pain. 25 tablet 3   pantoprazole (PROTONIX) 40 MG tablet TAKE 1 TABLET BY MOUTH DAILY. TAKE 30-60 MINUTES BEFORE FIRST MEAL OF THE DAY 30 tablet 2   Vitamin D, Ergocalciferol, (DRISDOL) 1.25 MG (50000 UNIT) CAPS capsule Take 50,000 Units by mouth every Sunday.     arformoterol (BROVANA) 15 MCG/2ML NEBU Take 2 mLs (15 mcg total) by nebulization 2 (two) times daily. (Patient not taking: Reported on 02/11/2023) 120 mL 11   salmeterol (SEREVENT DISKUS) 50 MCG/ACT diskus inhaler Inhale 1 puff into the lungs 2 (two) times daily. (Patient  not taking: Reported on 11/27/2022) 1 each 0   doxycycline (VIBRA-TABS) 100 MG tablet Take 1 tablet (100 mg total) by mouth 2 (two) times daily. (Patient not taking: Reported on 11/27/2022) 14 tablet 0   predniSONE (DELTASONE) 10 MG tablet Take 10 mg by mouth daily with breakfast. (Patient not taking: Reported on 02/11/2023)     predniSONE (DELTASONE) 10 MG tablet Take 4 tabs by mouth for 3 days, then 3 for 3 days, 2 for 3 days, 1 for 3 days and stop (Patient not taking: Reported on 02/11/2023) 30 tablet 0  No facility-administered medications prior to visit.     Review of Systems:   Constitutional:   No  weight loss, night sweats,  Fevers, chills, fatigue, or  lassitude.  HEENT:   No headaches,  Difficulty swallowing,  Tooth/dental problems, or  Sore throat,                No sneezing, itching, ear ache,  +nasal congestion, post nasal drip,   CV:  No chest pain,  Orthopnea, PND, swelling in lower extremities, anasarca, dizziness, palpitations, syncope.   GI  No heartburn, indigestion, abdominal pain, nausea, vomiting, diarrhea, change in bowel habits, loss of appetite, bloody stools.   Resp: No shortness of breath with exertion or at rest.  No excess mucus, no productive cough,  No non-productive cough,  No coughing up of blood.  No change in color of mucus.  No wheezing.  No chest wall deformity  Skin: no rash or lesions.  GU: no dysuria, change in color of urine, no urgency or frequency.  No flank pain, no hematuria   MS:  No joint pain or swelling.  No decreased range of motion.  No back pain.    Physical Exam  BP (!) 130/50 (BP Location: Left Arm, Patient Position: Sitting, Cuff Size: Normal)   Pulse 74   Temp 97.7 F (36.5 C) (Oral)   Ht 5\' 2"  (1.575 m)   Wt 166 lb 3.2 oz (75.4 kg)   SpO2 98%   BMI 30.40 kg/m   GEN: A/Ox3; pleasant , NAD, well nourished    HEENT:  [place]/AT,   NOSE-clear, THROAT-clear, no lesions, no postnasal drip or exudate noted.   NECK:  Supple w/  fair ROM; no JVD; normal carotid impulses w/o bruits; no thyromegaly or nodules palpated; no lymphadenopathy.    RESP  Clear  P & A; w/o, wheezes/ rales/ or rhonchi. no accessory muscle use, no dullness to percussion  CARD:  RRR, no m/r/g, no peripheral edema, pulses intact, no cyanosis or clubbing.  GI:   Soft & nt; nml bowel sounds; no organomegaly or masses detected.   Musco: Warm bil, no deformities or joint swelling noted.   Neuro: alert, no focal deficits noted.    Skin: Warm, no lesions or rashes    Lab Results:    BMET   BNP No results found for: "BNP"   Imaging: No results found.       Latest Ref Rng & Units 07/08/2016    2:41 PM  PFT Results  FVC-Pre L 1.87   FVC-Predicted Pre % 76   FVC-Post L 1.96   FVC-Predicted Post % 79   Pre FEV1/FVC % % 70   Post FEV1/FCV % % 74   FEV1-Pre L 1.32   FEV1-Predicted Pre % 72   FEV1-Post L 1.45   DLCO uncorrected ml/min/mmHg 11.23   DLCO UNC% % 52   DLVA Predicted % 71   TLC L 4.37   TLC % Predicted % 91   RV % Predicted % 112     No results found for: "NITRICOXIDE"      Assessment & Plan:   COPD with asthma COPD with asthma -appears to be at baseline.  Continue on current maintenance regimen with DuoNeb and budesonide nebulizers twice daily. Prevnar vaccine booster today. Activity as tolerated.  Recent chest x-ray was unremarkable.  Plan Patient Instructions  Continue on Pulmicort /Ipratropium/Albuterol Neb Twice daily . Rinse after use.  Prevnar 20 vaccine .  Continue Singulair daily  Claritin 10mg  At bedtime  As needed   Follow up with Dr. [name] in 6 months and As needed   Please contact office for sooner follow up if symptoms do not improve or worsen or seek emergency care      Allergic rhinitis Allergic rhinitis-continue on singular daily.  Claritin as needed  Postinflammatory pulmonary fibrosis (HCC) Previous CT chest has showed stable pleural-parenchymal scarring and Nodular  consolidation in the right upper lobe dating back to 2005 consistent with a benign etiology.  Chest x-ray September 2023 stable changes.     Rubye Oaks, NP 02/11/2023

## 2023-02-11 NOTE — Progress Notes (Signed)
Received flu vaccine for 2023 at [facility].

## 2023-02-11 NOTE — Addendum Note (Signed)
Addended by: Delrae Rend on: 02/11/2023 12:07 PM   Modules accepted: Orders

## 2023-02-11 NOTE — Assessment & Plan Note (Signed)
Allergic rhinitis-continue on singular daily.  Claritin as needed

## 2023-02-11 NOTE — Assessment & Plan Note (Signed)
COPD with asthma -appears to be at baseline.  Continue on current maintenance regimen with DuoNeb and budesonide nebulizers twice daily. Prevnar vaccine booster today. Activity as tolerated.  Recent chest x-ray was unremarkable.  Plan Patient Instructions  Continue on Pulmicort /Ipratropium/Albuterol Neb Twice daily . Rinse after use.  Prevnar 20 vaccine .  Continue Singulair daily  Claritin 10mg  At bedtime  As needed   Follow up with Dr. [name] in 6 months and As needed   Please contact office for sooner follow up if symptoms do not improve or worsen or seek emergency care

## 2023-02-16 ENCOUNTER — Ambulatory Visit: Payer: Medicare Other | Admitting: Internal Medicine

## 2023-02-23 NOTE — Telephone Encounter (Signed)
DONE

## 2023-03-04 ENCOUNTER — Ambulatory Visit: Payer: Medicare Other | Admitting: Nurse Practitioner

## 2023-04-19 ENCOUNTER — Other Ambulatory Visit: Payer: Self-pay | Admitting: *Deleted

## 2023-04-19 DIAGNOSIS — I739 Peripheral vascular disease, unspecified: Secondary | ICD-10-CM

## 2023-04-29 ENCOUNTER — Ambulatory Visit: Payer: Medicare Other | Admitting: Physician Assistant

## 2023-04-29 ENCOUNTER — Ambulatory Visit (INDEPENDENT_AMBULATORY_CARE_PROVIDER_SITE_OTHER)
Admission: RE | Admit: 2023-04-29 | Discharge: 2023-04-29 | Disposition: A | Discharge status: Home / Self Care | Payer: Medicare Other | Source: Ambulatory Visit | Attending: Vascular Surgery | Admitting: Vascular Surgery

## 2023-04-29 ENCOUNTER — Ambulatory Visit (HOSPITAL_COMMUNITY)
Admission: RE | Admit: 2023-04-29 | Discharge: 2023-04-29 | Disposition: A | Discharge status: Home / Self Care | Payer: Medicare Other | Source: Ambulatory Visit | Attending: Vascular Surgery | Admitting: Vascular Surgery

## 2023-04-29 VITALS — BP 152/59 | HR 59 | Temp 97.5°F | Resp 18 | Ht 62.0 in | Wt 164.7 lb

## 2023-04-29 DIAGNOSIS — I739 Peripheral vascular disease, unspecified: Secondary | ICD-10-CM | POA: Diagnosis present

## 2023-04-29 LAB — VAS US ABI WITH/WO TBI
Left ABI: 1.01
Right ABI: 1.02

## 2023-04-29 NOTE — Progress Notes (Signed)
Office Note     CC:  follow up Requesting Provider:  Dois Davenport, MD  HPI: Sharon Mcdaniel is a 87 y.o. ([birth year]) female who presents for routine follow up of peripheral artery disease. She has history if right femoral to below knee popliteal bypass on 04/28/20 by Dr. Edilia Bo. She has done very well since her surgery. She has known severe tibial artery occlusive disease but has been without claudication, rest pain or tissue loss. She has been medically managed on Aspirin and statin  Today she reports overall she is doing really well. She denies any pain in her legs on ambulation or rest, no non healing wounds. She has had some recent increase in swelling in her right leg. She reports no recent injury or fall. No change in her activity. She denies any associated pain. She does elevate her leg which seems to help. She does not wear any compression stockings. She remains compliant with her Aspirin and Statin.   Past Medical History:  Diagnosis Date   COPD (chronic obstructive pulmonary disease) (HCC)    Peripheral vascular disease (HCC)     Past Surgical History:  Procedure Laterality Date   ABDOMINAL HYSTERECTOMY  1980   BACK SURGERY  07/2011   synovial cyst removal   INTRAOPERATIVE ARTERIOGRAM Right 04/28/2020   Procedure: INTRA OPERATIVE ARTERIOGRAM;  Surgeon: Chuck Hint, MD;  Location: [facility] OR;  Service: Vascular;  Laterality: Right;   LEFT HEART CATH AND CORONARY ANGIOGRAPHY N/A 07/22/2022   Procedure: LEFT HEART CATH AND CORONARY ANGIOGRAPHY;  Surgeon: Lyn Records, MD;  Location: [facility] INVASIVE [facility] LAB;  Service: Cardiovascular;  Laterality: N/A;   THROMBECTOMY OF BYPASS GRAFT FEMORAL- POPLITEAL ARTERY Right 04/28/2020   Procedure: RIGHT FEMORAL-BELOW KNEE POPLITEAL BYPASS GRAFT USING NON-REVERSED GREATER SAPHENOUS VEIN GRAFT HARVESTED FROM RIGHT UPPER LEG. ;  Surgeon: Chuck Hint, MD;  Location: [facility] OR;  Service: Vascular;  Laterality: Right;   TUBAL LIGATION  1970     Social History   Socioeconomic History   Marital status: Widowed    Spouse name: Not on file   Number of children: Not on file   Years of education: Not on file   Highest education level: Not on file  Occupational History   Occupation: retired  Tobacco Use   Smoking status: Former    Packs/day: 1.00    Years: 30.00    Additional pack years: 0.00    Total pack years: 30.00    Types: Cigarettes    Quit date: 11/03/2003    Years since quitting: 19.4   Smokeless tobacco: Never  Vaping Use   Vaping Use: Never used  Substance and Sexual Activity   Alcohol use: No   Drug use: No   Sexual activity: Never  Other Topics Concern   Not on file  Social History Narrative   Not on file   Social Determinants of Health   Financial Resource Strain: Not on file  Food Insecurity: Not on file  Transportation Needs: Not on file  Physical Activity: Not on file  Stress: Not on file  Social Connections: Not on file  Intimate Partner Violence: Not on file    Family History  Problem Relation Age of Onset   Lung cancer Mother    Rheum arthritis Mother    Asthma Father    Heart disease Father     Current Outpatient Medications  Medication Sig Dispense Refill   Aspirin 81 MG CAPS Take 81 mg by mouth daily.  atorvastatin (LIPITOR) 40 MG tablet TAKE 1 TABLET BY MOUTH DAILY AT BEDTIME 90 tablet 3   budesonide (PULMICORT) 0.5 MG/2ML nebulizer solution Take 2 mLs (0.5 mg total) by nebulization in the morning and at bedtime. 120 mL 12   clonazePAM (KLONOPIN) 0.5 MG tablet Take 0.25 mg by mouth at bedtime.     diltiazem (CARDIZEM CD) 120 MG 24 hr capsule Take 1 capsule (120 mg total) by mouth daily. 90 capsule 2   diltiazem (CARDIZEM) 30 MG tablet Take 1 tablet by mouth once daily as needed for palpitations 30 tablet 11   diphenhydrAMINE (BENADRYL) 25 MG tablet Take 50 mg by mouth daily as needed (bee stings).     donepezil (ARICEPT) 5 MG tablet Take 5 mg by mouth daily.     estradiol  (ESTRACE) 0.1 MG/GM vaginal cream Place 1 Applicatorful vaginally every Thursday.     famotidine (PEPCID) 20 MG tablet One after supper 30 tablet 11   furosemide (LASIX) 20 MG tablet Take 20 mg by mouth 2 (two) times daily.     ibuprofen (ADVIL) 200 MG tablet Take 200-400 mg by mouth every 6 (six) hours as needed for moderate pain.     ipratropium-albuterol (DUONEB) 0.5-2.5 (3) MG/3ML SOLN Take 3 mLs by nebulization in the morning and at bedtime. 360 mL 11   levothyroxine (SYNTHROID, LEVOTHROID) 112 MCG tablet Take 112 mcg by mouth daily.     losartan (COZAAR) 25 MG tablet Take 25 mg by mouth daily.     meloxicam (MOBIC) 7.5 MG tablet Take 7.5 mg by mouth daily.     mirtazapine (REMERON) 15 MG tablet Take 15 mg by mouth at bedtime.     montelukast (SINGULAIR) 10 MG tablet Take 10 mg by mouth daily.     nitroGLYCERIN (NITROSTAT) 0.4 MG SL tablet Place 1 tablet (0.4 mg total) under the tongue every 5 (five) minutes as needed for chest pain. 25 tablet 3   pantoprazole (PROTONIX) 40 MG tablet TAKE 1 TABLET BY MOUTH DAILY. TAKE 30-60 MINUTES BEFORE FIRST MEAL OF THE DAY 30 tablet 2   salmeterol (SEREVENT DISKUS) 50 MCG/ACT diskus inhaler Inhale 1 puff into the lungs 2 (two) times daily. 1 each 0   Vitamin D, Ergocalciferol, (DRISDOL) 1.25 MG (50000 UNIT) CAPS capsule Take 50,000 Units by mouth every Sunday.     arformoterol (BROVANA) 15 MCG/2ML NEBU Take 2 mLs (15 mcg total) by nebulization 2 (two) times daily. (Patient not taking: Reported on 02/11/2023) 120 mL 11   No current facility-administered medications for this visit.    Allergies  Allergen Reactions   Betadine [Povidone Iodine] Rash   Fluconazole In Dextrose Hives and Shortness Of Breath   Gatifloxacin Hives and Shortness Of Breath   Iodinated Contrast Media Hives, Shortness Of Breath and Rash    States she tolerates betadine   Ioxaglate Hives and Shortness Of Breath   Quinolones Hives and Shortness Of Breath   Tequin Hives and  Shortness Of Breath   Alendronate     tingling   Bee Venom Hives   Tramadol     jittery   Zithromax [Azithromycin]     GI Upset   Codeine Other (See Comments)    Makes her jittery      REVIEW OF SYSTEMS:  X[X]  denotes positive finding, [ ]  denotes negative finding Cardiac  Comments:  Chest pain or chest pressure:    Shortness of breath upon exertion:    Short of breath when lying flat:  Irregular heart rhythm:        Vascular    Pain in calf, thigh, or hip brought on by ambulation:    Pain in feet at night that wakes you up from your sleep:     Blood clot in your veins:    Leg swelling:  X       Pulmonary    Oxygen at home:    Productive cough:     Wheezing:         Neurologic    Sudden weakness in arms or legs:     Sudden numbness in arms or legs:     Sudden onset of difficulty speaking or slurred speech:    Temporary loss of vision in one eye:     Problems with dizziness:         Gastrointestinal    Blood in stool:     Vomited blood:         Genitourinary    Burning when urinating:     Blood in urine:        Psychiatric    Major depression:         Hematologic    Bleeding problems:    Problems with blood clotting too easily:        Skin    Rashes or ulcers:        Constitutional    Fever or chills:      PHYSICAL EXAMINATION:  Vitals:   04/29/23 1349  BP: (!) 152/59  Pulse: (!) 59  Resp: 18  Temp: (!) 97.5 F (36.4 C)  TempSrc: Temporal  SpO2: 98%  Weight: 164 lb 11.2 oz (74.7 kg)  Height: 5\' 2"  (1.575 m)    General:  WDWN in NAD; vital signs documented above Gait: Normal HENT: WNL, normocephalic Pulmonary: normal non-labored breathing , without wheezing Cardiac: regular HR Abdomen: soft, NT, no masses Vascular Exam/Pulses: 2+ femoral, 2+ left PT, no palpable PT on right or DP pulses bilaterally. Feet are warm and well perfused Extremities: without ischemic changes, without Gangrene , without cellulitis; without open wounds; edema  present in right distal leg and foot Musculoskeletal: no muscle wasting or atrophy  Neurologic: A&O X 3 Psychiatric:  The pt has Normal affect.   Non-Invasive Vascular Imaging:   +-----------+--------+-----+--------+----------+--------+  RIGHT     PSV cm/sRatioStenosisWaveform  Comments  +-----------+--------+-----+--------+----------+--------+  EIA Distal 164                  biphasic            +-----------+--------+-----+--------+----------+--------+  CFA Prox   138                  biphasic            +-----------+--------+-----+--------+----------+--------+  SFA Prox                                  bypass    +-----------+--------+-----+--------+----------+--------+  ATA Distal 28                   monophasic          +-----------+--------+-----+--------+----------+--------+  PTA Distal 66                   biphasic            +-----------+--------+-----+--------+----------+--------+  PERO Distal22  monophasicbrisk     +-----------+--------+-----+--------+----------+--------+     Right Graft #1: Femoral to popliteal  +------------------+--------+--------+--------+--------+                   PSV cm/sStenosisWaveformComments  +------------------+--------+--------+--------+--------+  Inflow           147             biphasic          +------------------+--------+--------+--------+--------+  Prox Anastomosis  82              biphasic          +------------------+--------+--------+--------+--------+  Proximal Graft    98              biphasic          +------------------+--------+--------+--------+--------+  Mid Graft         90              biphasic          +------------------+--------+--------+--------+--------+  Distal Graft      107             biphasic          +------------------+--------+--------+--------+--------+  Distal Anastomosis62              biphasic           +------------------+--------+--------+--------+--------+  Outflow          102             biphasic          +------------------+--------+--------+--------+--------+   Summary:  Right: Patent right femoral to popliteal bypass graft with no visualized stenosis.  Probable tibial artery occlusive disease.   +-------+-----------+-----------+------------+------------+  ABI/TBIToday's ABIToday's TBIPrevious ABIPrevious TBI  +-------+-----------+-----------+------------+------------+  Right 1.02       0.48       0.75        0.50          +-------+-----------+-----------+------------+------------+  Left  1.01       0.31       0.88        0.34          +-------+-----------+-----------+------------+------------+     ASSESSMENT/PLAN:: 87 y.o. female here for follow up for PAD. She has history if right femoral to below knee popliteal bypass on 04/28/20 by Dr. Edilia Bo. She does not have any rest pain, claudication or tissue loss.  - ABI increased bilaterally, TBI essentially unchanged from 1 year ago - Duplex shows patent RLE bypass graft - elevate legs as needed for swelling - encourage increased mobilization/ walking regimen - Continue Aspirin and statin  - She will follow up in 1 year with repeat ABI and RLE bypass graft duplex   Sharon Congress, PA-C [facility] [phone]  Clinic MD:  Edilia Bo

## 2023-05-10 ENCOUNTER — Ambulatory Visit: Payer: Medicare Other | Admitting: Nurse Practitioner

## 2023-05-12 ENCOUNTER — Inpatient Hospital Stay: Admission: RE | Admit: 2023-05-12 | Payer: Medicare Other | Source: Ambulatory Visit

## 2023-05-12 ENCOUNTER — Ambulatory Visit
Admission: RE | Admit: 2023-05-12 | Discharge: 2023-05-12 | Disposition: A | Discharge status: Home / Self Care | Payer: Medicare Other | Source: Ambulatory Visit | Attending: Family Medicine | Admitting: Family Medicine

## 2023-05-12 DIAGNOSIS — E2839 Other primary ovarian failure: Secondary | ICD-10-CM

## 2023-05-12 DIAGNOSIS — Z1231 Encounter for screening mammogram for malignant neoplasm of breast: Secondary | ICD-10-CM

## 2023-05-15 ENCOUNTER — Other Ambulatory Visit: Payer: Self-pay

## 2023-05-15 DIAGNOSIS — I739 Peripheral vascular disease, unspecified: Secondary | ICD-10-CM

## 2023-07-24 ENCOUNTER — Other Ambulatory Visit: Payer: Self-pay | Admitting: Nurse Practitioner

## 2023-10-12 ENCOUNTER — Other Ambulatory Visit: Payer: Self-pay | Admitting: Internal Medicine

## 2023-12-09 ENCOUNTER — Telehealth: Payer: Self-pay | Admitting: Adult Health

## 2023-12-09 NOTE — Telephone Encounter (Signed)
 Patient needs refill of Budesonide  .  Pharmacy: [facility]

## 2023-12-10 MED ORDER — BUDESONIDE 0.5 MG/2ML IN SUSP: 0.5000 mg | Freq: Two times a day (BID) | RESPIRATORY_TRACT | 6 refills | Status: DC

## 2023-12-10 NOTE — Telephone Encounter (Signed)
 Pulmicort  solution has been sent to preferred pharmacy.  Pt is aware and voiced her understanding.  Nothing further needed.

## 2023-12-10 NOTE — Telephone Encounter (Signed)
 Patient checking on refill for Nebulizer solution. Patient out of medication. Patient phone number is [phone].

## 2024-03-14 ENCOUNTER — Other Ambulatory Visit: Payer: Self-pay | Admitting: Family Medicine

## 2024-03-14 DIAGNOSIS — R1010 Upper abdominal pain, unspecified: Secondary | ICD-10-CM

## 2024-03-14 DIAGNOSIS — R6881 Early satiety: Secondary | ICD-10-CM

## 2024-03-21 ENCOUNTER — Ambulatory Visit
Admission: RE | Admit: 2024-03-21 | Discharge: 2024-03-21 | Disposition: A | Discharge status: Home / Self Care | Source: Ambulatory Visit | Attending: Family Medicine | Admitting: Family Medicine

## 2024-03-21 DIAGNOSIS — R1010 Upper abdominal pain, unspecified: Secondary | ICD-10-CM

## 2024-03-21 DIAGNOSIS — R6881 Early satiety: Secondary | ICD-10-CM

## 2024-05-04 ENCOUNTER — Other Ambulatory Visit: Payer: Self-pay | Admitting: Nurse Practitioner

## 2024-06-09 ENCOUNTER — Encounter (HOSPITAL_BASED_OUTPATIENT_CLINIC_OR_DEPARTMENT_OTHER): Payer: Self-pay | Admitting: Adult Health

## 2024-06-09 ENCOUNTER — Ambulatory Visit (HOSPITAL_BASED_OUTPATIENT_CLINIC_OR_DEPARTMENT_OTHER): Admitting: Adult Health

## 2024-06-09 VITALS — BP 145/70 | HR 56 | Ht 62.0 in | Wt 146.0 lb

## 2024-06-09 DIAGNOSIS — J309 Allergic rhinitis, unspecified: Secondary | ICD-10-CM

## 2024-06-09 DIAGNOSIS — J449 Chronic obstructive pulmonary disease, unspecified: Secondary | ICD-10-CM

## 2024-06-09 MED ORDER — BUDESONIDE 0.5 MG/2ML IN SUSP: 0.5000 mg | Freq: Two times a day (BID) | RESPIRATORY_TRACT | 11 refills | Status: DC

## 2024-06-09 MED ORDER — MONTELUKAST SODIUM 10 MG PO TABS: 10.0000 mg | ORAL_TABLET | Freq: Every day | ORAL | 11 refills | Status: AC

## 2024-06-09 MED ORDER — IPRATROPIUM-ALBUTEROL 0.5-2.5 (3) MG/3ML IN SOLN: 3.0000 mL | Freq: Two times a day (BID) | RESPIRATORY_TRACT | 11 refills | Status: DC

## 2024-06-09 MED ORDER — ARFORMOTEROL TARTRATE 15 MCG/2ML IN NEBU: 15.0000 ug | INHALATION_SOLUTION | Freq: Two times a day (BID) | RESPIRATORY_TRACT | 11 refills | Status: DC

## 2024-06-09 NOTE — Patient Instructions (Addendum)
 Continue on Pulmicort  /Ipratropium/Albuterol  Neb Twice daily . Rinse after use.  Continue Singulair  daily  Claritin 10mg  At bedtime  As needed   Chest xray today  Flu shot this fall  You need your TDAP booster Follow up with Dr. Darlean or Thereasa Iannello NP in 1 year and As needed    Please contact office for sooner follow up if symptoms do not improve or worsen or seek emergency care

## 2024-06-09 NOTE — Progress Notes (Signed)
 @Patient  ID: Sharon Mcdaniel, female    DOB: April 26, 1936, 88 y.o.   MRN: [id number]  Chief Complaint  Patient presents with   Follow-up    Med check    Referring provider: Burney Darice LITTIE, MD  HPI: 88 yo female former smoker followed for COPD with asthmatic bronchitis   TEST/EVENTS :  PFTs July 06, 2006   FEV1 of 58% predicted with a ratio of 56%  And 15% improvement after bronchodilator consistent with an asthmatic component.   Allergy  profile 06/10/2016 >  Eos 0.1/  IgE 7 RAST neg  - Sinus CT 06/15/2016 > neg      - PFT's  07/08/2016  FEV1 1.45 (79 % ) ratio 74  p 10 % improvement from saba p duoneb > 6 h  prior to study with DLCO  52 % corrects to 71  % for alv volume     High-resolution CT chest October 2017 -Neg  for interstitial lung disease, biapical pleural-parenchymal scarring unchanged since 2005.  Subpleural nodular consolidation in the right upper lobe dating back to 2005 consistent with a benign etiology.  06/09/2024 Follow up : COPD, Allergic Rhinitis  Discussed the use of AI scribe software for clinical note transcription with the patient, who gave verbal consent to proceed.  History of Present Illness Sharon Mcdaniel is an 88 year old female with COPD who presents for a routine follow-up.  She uses a nebulizer with Pulmicort  and Brovana  twice daily, and DuoNeb, which includes albuterol  and ipratropium, also twice a day.  Says breathing is doing well overall. No flare in cough or wheezing.   She remains on  Singulair  (montelukast ) for allergies. No flare in symptoms. All medications are filled at [facility].  She describes her activity level as typical for someone her age, maintaining independence by driving and doing her own grocery shopping. She lives at home with her daughter.  She has not had a flu shot recently but has received a pneumonia shot. She is unsure about her tetanus shot status, noting it has been ten years since her last one.    Allergies   Allergen Reactions   Betadine [Povidone Iodine] Rash   Fluconazole In Dextrose  Hives and Shortness Of Breath   Gatifloxacin Hives and Shortness Of Breath   Iodinated Contrast Media Hives, Shortness Of Breath and Rash    States she tolerates betadine   Ioxaglate Hives and Shortness Of Breath   Quinolones Hives and Shortness Of Breath   Tequin Hives and Shortness Of Breath   Alendronate     tingling   Bee Venom Hives   Tramadol     jittery   Zithromax  [Azithromycin ]     GI Upset   Codeine Other (See Comments)    Makes her jittery     Immunization History  Administered Date(s) Administered   DTaP 11/06/2013   Fluad Quad(high Dose 65+) 07/03/2020, 08/08/2021   Influenza Whole 08/24/2006, 07/31/2016   Influenza, High Dose Seasonal PF 08/03/2015   Influenza,inj,quad, With Preservative 08/02/2017   Influenza-Unspecified 07/03/2014   PFIZER(Purple Top)SARS-COV-2 Vaccination 11/21/2019, 12/11/2019   PNEUMOCOCCAL CONJUGATE-20 02/11/2023   Pneumococcal Polysaccharide-23 09/02/2004, 11/06/2013   Pneumococcal-Unspecified 11/06/2013   Tdap 11/06/2013    Past Medical History:  Diagnosis Date   COPD (chronic obstructive pulmonary disease) (HCC)    Peripheral vascular disease (HCC)     Tobacco History: Social History   Tobacco Use  Smoking Status Former   Current packs/day: 0.00   Average packs/day:  1 pack/day for 30.0 years (30.0 ttl pk-yrs)   Types: Cigarettes   Start date: 11/02/1973   Quit date: 11/03/2003   Years since quitting: 20.6  Smokeless Tobacco Never   Counseling given: Not Answered   Outpatient Medications Prior to Visit  Medication Sig Dispense Refill   arformoterol  (BROVANA ) 15 MCG/2ML NEBU Take 2 mLs (15 mcg total) by nebulization 2 (two) times daily. 120 mL 11   Aspirin  81 MG CAPS Take 81 mg by mouth daily.     atorvastatin  (LIPITOR) 40 MG tablet TAKE 1 TABLET BY MOUTH DAILY AT BEDTIME 90 tablet 3   budesonide  (PULMICORT ) 0.5 MG/2ML nebulizer solution  Take 2 mLs (0.5 mg total) by nebulization in the morning and at bedtime. 120 mL 6   clonazePAM  (KLONOPIN ) 0.5 MG tablet Take 0.25 mg by mouth at bedtime.     diltiazem  (CARDIZEM  CD) 120 MG 24 hr capsule TAKE 1 CAPSULE BY MOUTH DAILY 90 capsule 0   diltiazem  (CARDIZEM ) 30 MG tablet Take 1 tablet by mouth once daily as needed for palpitations 30 tablet 11   diphenhydrAMINE  (BENADRYL ) 25 MG tablet Take 50 mg by mouth daily as needed (bee stings).     donepezil (ARICEPT) 5 MG tablet Take 5 mg by mouth daily.     estradiol  (ESTRACE ) 0.1 MG/GM vaginal cream Place 1 Applicatorful vaginally every Thursday.     famotidine  (PEPCID ) 20 MG tablet One after supper 30 tablet 11   furosemide (LASIX) 20 MG tablet Take 20 mg by mouth 2 (two) times daily.     ibuprofen  (ADVIL ) 200 MG tablet Take 200-400 mg by mouth every 6 (six) hours as needed for moderate pain.     ipratropium-albuterol  (DUONEB) 0.5-2.5 (3) MG/3ML SOLN Take 3 mLs by nebulization in the morning and at bedtime. 360 mL 4   levothyroxine  (SYNTHROID , LEVOTHROID) 112 MCG tablet Take 112 mcg by mouth daily.     losartan (COZAAR) 25 MG tablet Take 25 mg by mouth daily.     meloxicam (MOBIC) 7.5 MG tablet Take 7.5 mg by mouth daily.     mirtazapine  (REMERON ) 15 MG tablet Take 15 mg by mouth at bedtime.     montelukast  (SINGULAIR ) 10 MG tablet Take 10 mg by mouth daily.     nitroGLYCERIN  (NITROSTAT ) 0.4 MG SL tablet Place 1 tablet (0.4 mg total) under the tongue every 5 (five) minutes as needed for chest pain. 25 tablet 3   pantoprazole  (PROTONIX ) 40 MG tablet TAKE 1 TABLET BY MOUTH DAILY. TAKE 30-60 MINUTES BEFORE FIRST MEAL OF THE DAY 30 tablet 2   salmeterol (SEREVENT  DISKUS) 50 MCG/ACT diskus inhaler Inhale 1 puff into the lungs 2 (two) times daily. 1 each 0   Vitamin D, Ergocalciferol, (DRISDOL) 1.25 MG (50000 UNIT) CAPS capsule Take 50,000 Units by mouth every Sunday.     No facility-administered medications prior to visit.     Review of  Systems:   Constitutional:   No  weight loss, night sweats,  Fevers, chills, fatigue, or  lassitude.  HEENT:   No headaches,  Difficulty swallowing,  Tooth/dental problems, or  Sore throat,                No sneezing, itching, ear ache, nasal congestion, post nasal drip,   CV:  No chest pain,  Orthopnea, PND, swelling in lower extremities, anasarca, dizziness, palpitations, syncope.   GI  No heartburn, indigestion, abdominal pain, nausea, vomiting, diarrhea, change in bowel habits, loss of appetite, bloody stools.  Resp: No shortness of breath with exertion or at rest.  No excess mucus, no productive cough,  No non-productive cough,  No coughing up of blood.  No change in color of mucus.  No wheezing.  No chest wall deformity  Skin: no rash or lesions.  GU: no dysuria, change in color of urine, no urgency or frequency.  No flank pain, no hematuria   MS:  No joint pain or swelling.  No decreased range of motion.  No back pain.    Physical Exam  BP (!) 145/70 (BP Location: Left Arm, Patient Position: Sitting)   Pulse (!) 56   Ht 5' 2 (1.575 m)   Wt 146 lb (66.2 kg)   SpO2 97%   BMI 26.70 kg/m   GEN: A/Ox3; pleasant , NAD, well nourished    HEENT:  [place]/AT,   NOSE-clear, THROAT-clear, no lesions, no postnasal drip or exudate noted.   NECK:  Supple w/ fair ROM; no JVD; normal carotid impulses w/o bruits; no thyromegaly or nodules palpated; no lymphadenopathy.    RESP  Clear  P & A; w/o, wheezes/ rales/ or rhonchi. no accessory muscle use, no dullness to percussion  CARD:  RRR, no m/r/g, no peripheral edema, pulses intact, no cyanosis or clubbing.  GI:   Soft & nt; nml bowel sounds; no organomegaly or masses detected.   Musco: Warm bil, no deformities or joint swelling noted.   Neuro: alert, no focal deficits noted.    Skin: Warm, no lesions or rashes    Lab Results:    BNP No results found for: BNP  ProBNP   Imaging: No results found.  Administration  History     None          Latest Ref Rng & Units 07/08/2016    2:41 PM  PFT Results  FVC-Pre L 1.87   FVC-Predicted Pre % 76   FVC-Post L 1.96   FVC-Predicted Post % 79   Pre FEV1/FVC % % 70   Post FEV1/FCV % % 74   FEV1-Pre L 1.32   FEV1-Predicted Pre % 72   FEV1-Post L 1.45   DLCO uncorrected ml/min/mmHg 11.23   DLCO UNC% % 52   DLVA Predicted % 71   TLC L 4.37   TLC % Predicted % 91   RV % Predicted % 112     No results found for: NITRICOXIDE      Assessment & Plan:   No problem-specific Assessment & Plan notes found for this encounter.  Assessment and Plan Assessment & Plan Chronic obstructive pulmonary disease (COPD)   COPD is well-managed with the current treatment regimen, with no recent hospitalizations or need for antibiotics or steroids, indicating good symptom control. Continue nebulizer treatments with Pulmicort  and Brovana  twice daily, and DuoNeb (albuterol  and ipratropium) twice daily. Order a chest x-ray.  Allergic rhinitis   Allergic rhinitis is managed with montelukast . Additional symptoms can be addressed with Claritin as needed. Continue montelukast  and use Claritin as needed for allergy  symptoms.  Immunization status: tetanus-diphtheria-pertussis (TDAP) booster needed   The TDAP booster is overdue as it has been ten years since the last tetanus shot. Recommend the TDAP booster and a flu shot in September or October.      Madelin Stank, NP 06/09/2024

## 2024-06-14 ENCOUNTER — Ambulatory Visit: Admitting: Physician Assistant

## 2024-07-12 ENCOUNTER — Ambulatory Visit: Attending: Cardiovascular Disease | Admitting: Emergency Medicine

## 2024-07-12 ENCOUNTER — Encounter: Payer: Self-pay | Admitting: Emergency Medicine

## 2024-07-12 VITALS — BP 128/67 | HR 68 | Ht 62.0 in | Wt 144.6 lb

## 2024-07-12 DIAGNOSIS — E782 Mixed hyperlipidemia: Secondary | ICD-10-CM

## 2024-07-12 DIAGNOSIS — R002 Palpitations: Secondary | ICD-10-CM

## 2024-07-12 DIAGNOSIS — I2510 Atherosclerotic heart disease of native coronary artery without angina pectoris: Secondary | ICD-10-CM

## 2024-07-12 DIAGNOSIS — I739 Peripheral vascular disease, unspecified: Secondary | ICD-10-CM

## 2024-07-12 DIAGNOSIS — J449 Chronic obstructive pulmonary disease, unspecified: Secondary | ICD-10-CM

## 2024-07-12 DIAGNOSIS — I471 Supraventricular tachycardia, unspecified: Secondary | ICD-10-CM

## 2024-07-12 DIAGNOSIS — I351 Nonrheumatic aortic (valve) insufficiency: Secondary | ICD-10-CM

## 2024-07-12 DIAGNOSIS — I6523 Occlusion and stenosis of bilateral carotid arteries: Secondary | ICD-10-CM | POA: Diagnosis not present

## 2024-07-12 DIAGNOSIS — I340 Nonrheumatic mitral (valve) insufficiency: Secondary | ICD-10-CM

## 2024-07-12 NOTE — Patient Instructions (Signed)
 Medication Instructions:  Your physician recommends that you continue on your current medications as directed. Please refer to the Current Medication list given to you today. *If you need a refill on your cardiac medications before your next appointment, please call your pharmacy*  Lab Work: TODAY-CBC, CMET & LIPIDS If you have labs (blood work) drawn today and your tests are completely normal, you will receive your results only by: MyChart Message (if you have MyChart) OR A paper copy in the mail If you have any lab test that is abnormal or we need to change your treatment, we will call you to review the results.  Testing/Procedures: Your physician has requested that you have a carotid duplex. This test is an ultrasound of the carotid arteries in your neck. It looks at blood flow through these arteries that supply the brain with blood. Allow one hour for this exam. There are no restrictions or special instructions.  Follow-Up: At [facility], you and your health needs are our priority.  As part of our continuing mission to provide you with exceptional heart care, our providers are all part of one team.  This team includes your primary Cardiologist (physician) and Advanced Practice Providers or APPs (Physician Assistants and Nurse Practitioners) who all work together to provide you with the care you need, when you need it.  Your next appointment:   12 month(s)  Provider:   Maude Emmer, MD    We recommend signing up for the patient portal called MyChart.  Sign up information is provided on this After Visit Summary.  MyChart is used to connect with patients for Virtual Visits (Telemedicine).  Patients are able to view lab/test results, encounter notes, upcoming appointments, etc.  Non-urgent messages can be sent to your provider as well.   To learn more about what you can do with MyChart, go to [email or web address].   Other Instructions

## 2024-07-12 NOTE — Progress Notes (Unsigned)
 Cardiology Office Note:    Date:  07/13/2024  ID:  Sharon Mcdaniel, DOB 05/09/1936, MRN [id number] PCP: Burney Darice LITTIE, MD  [facility] Providers Cardiologist:  Maude Emmer, MD Cardiology APP:  Rana Lum LITTIE, NP       Patient Profile:       Chief Complaint: 1 year follow-up History of Present Illness:  Sharon Mcdaniel is a 88 y.o. female with visit-pertinent history of mild MR, mild AI, former tobacco use, COPD, palpitations, carotid artery disease, hyperlipidemia, PVD s/p right femoral-popliteal artery bypass 04/2020 and known tibial artery occlusive disease  Previously seen by Dr. Ladona, she established with heart care in 2017 seen by Dr. Emmer.  History of PVD followed by Dr. Melvenia with [facility].  She was seen in clinic on 05/19/2021.  She had concerns for fatigue and palpitations.  Cardiac monitor revealed no significant arrhythmias, normal sinus rhythm, symptoms of fluttering/dyspnea with no correlation of arrhythmia.  No changes to treatment plan were implemented and she was to follow-up in 1 year.  Echocardiogram 03/04/2022 revealed LVEF 73%, no RWMA, normal diastolic parameters, normal RV, mild MR, mild MR, borderline dilation of aortic root measuring 37 mm.  On 07/2022 she reported chest pains, DOE, and presyncope felt to be unstable angina.  Left heart cath which revealed nonobstructive CAD with ramus intermedius with 50-60% narrowing in proximal vessel, right dominance, normal LVEDP, normal LV function. Had redness, flushing the day following cath despite premedication for dye allergy . Cardiac monitor shows predominant sinus rhythm, multiple runs of supraventricular tachycardia, the longest lasting 19 seconds. No triggered events with these. Due to asthma, as needed diltiazem  30 mg as needed for palpitations was ordered.   She followed up in clinic on 08/28/2022.  She continued to have palpitations on a consistent basis associated with presyncope.  She was started on diltiazem   120 mg daily. She was last seen in clinic on 11/27/2022.  She was without any acute complaints.  Palpitations are well-controlled.  She was to follow-up in 1 year.   Discussed the use of AI scribe software for clinical note transcription with the patient, who gave verbal consent to proceed.  History of Present Illness Sharon Mcdaniel is an 88 year old female with coronary artery disease who presents for a cardiovascular follow-up.   Today patient is doing well overall.  She is without any acute cardiovascular concerns or complaints.  Palpitations are well-controlled with daily diltiazem . She experiences no chest pain, lightheadedness, dizziness, syncope or presyncope. Blood pressure at home is generally good. She remains active, engaging in activities such as shopping and light yard work without experiencing chest pain.  She is a former smoker with COPD, under the care of a pulmonologist. Shortness of breath varies with activity but shows no significant increase. No leg swelling or weight gain.  Review of systems:  Please see the history of present illness. All other systems are reviewed and otherwise negative.      Studies Reviewed:        ZIO 07/24/2022 Patch Wear Time:  13 days and 17 hours (2023-09-27T15:26:41-0400 to 2023-10-11T09:16:43-0400)   Patient had a min HR of 49 bpm, max HR of 190 bpm, and avg HR of 64 bpm. Predominant underlying rhythm was Sinus Rhythm. Slight P wave morphology changes were noted. 47 Supraventricular Tachycardia runs occurred, the run with the fastest interval lasting  4 beats with a max rate of 190 bpm, the longest lasting 19.2 secs with an avg rate of  109 bpm. Run of VEs was detected within +/- 45 seconds of symptomatic patient event(s). Isolated SVEs were occasional (1.4%, 17383), SVE Couplets were rare (<1.0%,  234), and SVE Triplets were rare (<1.0%, 34). Isolated VEs were rare (<1.0%, 2574), VE Couplets were rare (<1.0%, 1), and VE Triplets were rare (<1.0%,  1).  Cardiac catheterization 07/22/2022 CONCLUSIONS: Right dominant coronary anatomy with no obstructive coronary disease noted. Ramus intermedius contains a proximal 50 to 60% narrowing. Normal LVEDP.  Normal systolic function with EF greater than 55%. Diagnostic Dominance: Right  Echocardiogram 03/04/2022  1. Left ventricular ejection fraction by 3D volume is 73 %. The left  ventricle has normal function. The left ventricle has no regional wall  motion abnormalities. There is mild concentric left ventricular  hypertrophy. Left ventricular diastolic  parameters were normal.   2. Right ventricular systolic function is normal. The right ventricular  size is normal.   3. The mitral valve is normal in structure. Mild mitral valve  regurgitation. No evidence of mitral stenosis.   4. The aortic valve is normal in structure. Aortic valve regurgitation is  mild. Aortic valve sclerosis is present, with no evidence of aortic valve  stenosis. Aortic regurgitation PHT measures 712 msec.   5. There is borderline dilatation of the aortic root, measuring 37 mm.   6. The inferior vena cava is normal in size with greater than 50%  respiratory variability, suggesting right atrial pressure of 3 mmHg.   Carotid duplex 07/04/2021 Right Carotid: Velocities in the right ICA are consistent with a 1-39%  stenosis.                Non-hemodynamically significant plaque <50% noted in the  CCA.                Stable RICA velocities.   Left Carotid: Velocities in the left ICA are consistent with a 1-39%  stenosis.               Stable LICA velocities.   Vertebrals:  Bilateral vertebral arteries demonstrate antegrade flow.  Subclavians: Left subclavian artery flow was disturbed. Normal flow  hemodynamics              were seen in the right subclavian artery.  Risk Assessment/Calculations:              Physical Exam:   VS:  BP 128/67   Pulse 68   Ht 5' 2 (1.575 m)   Wt 144 lb 9.6 oz (65.6 kg)   SpO2  97%   BMI 26.45 kg/m    Wt Readings from Last 3 Encounters:  07/12/24 144 lb 9.6 oz (65.6 kg)  06/09/24 146 lb (66.2 kg)  04/29/23 164 lb 11.2 oz (74.7 kg)    GEN: Well nourished, well developed in no acute distress NECK: No JVD; No carotid bruits CARDIAC: RRR, no murmurs, rubs, gallops RESPIRATORY:  Clear to auscultation without rales, wheezing or rhonchi  ABDOMEN: Soft, non-tender, non-distended EXTREMITIES:  No edema; No acute deformity      Assessment and Plan:  Coronary artery disease LHC 07/2022 showed no obstructive coronary disease, found to have 50 to 60% narrowing in ramus intermedius.  Medical management was recommended - Today she is without anginal symptoms.  No indication for further ischemic evaluation at this time - Continue aspirin  81 mg daily, atorvastatin  40 mg daily, nitroglycerin  as needed  Paroxysmal SVT Palpitations Quiescent and well-controlled - Continue diltiazem  120 mg daily and diltiazem  30 mg as needed  Hypertension Blood pressure today is well-controlled at 128/67 - Continue diltiazem  120 mg daily, furosemide 20 mg twice daily, losartan 25 mg daily  Carotid artery disease Carotid duplex 07/2021 showed bilateral ICA 1-39% stenosis - She denies any new neurological symptoms - Plan for repeat carotid duplex for routine monitoring  PAD History of right femoral to below knee popliteal bypass on 04/28/20 by Dr. Eliza  - Most recent duplex in 2024 showed patent RLE bypass - Managed by vascular surgery  Hyperlipidemia LDL 51 on 07/2022 and well-controlled - Repeat fasting lipid panel today - Continue atorvastatin  40 mg daily  Mild MR Mild AI Echocardiogram 03/2022 showed moderate mitral valve regurgitation and moderate aortic valve regurgitation - Remains asymptomatic.  There is no interventions warranted at this time  COPD Well-controlled on current medication regimen without any recent exacerbations - Avoid beta-blockers or SVT - Management  per pulmonology      Dispo:  Return in about 1 year (around 07/12/2025).  Signed, Lum LITTIE Louis, NP

## 2024-07-13 ENCOUNTER — Encounter: Payer: Self-pay | Admitting: Emergency Medicine

## 2024-07-17 ENCOUNTER — Other Ambulatory Visit: Payer: Self-pay | Admitting: Family Medicine

## 2024-07-17 DIAGNOSIS — Z1231 Encounter for screening mammogram for malignant neoplasm of breast: Secondary | ICD-10-CM

## 2024-07-24 ENCOUNTER — Other Ambulatory Visit: Payer: Self-pay

## 2024-07-24 MED ORDER — DILTIAZEM HCL ER COATED BEADS 120 MG PO CP24: 120.0000 mg | ORAL_CAPSULE | Freq: Every day | ORAL | 3 refills | Status: AC

## 2024-07-26 ENCOUNTER — Ambulatory Visit (HOSPITAL_COMMUNITY): Admission: RE | Admit: 2024-07-26 | Source: Ambulatory Visit

## 2024-08-04 ENCOUNTER — Encounter (HOSPITAL_COMMUNITY)

## 2024-08-04 ENCOUNTER — Ambulatory Visit
Admission: RE | Admit: 2024-08-04 | Discharge: 2024-08-04 | Disposition: A | Discharge status: Home / Self Care | Source: Ambulatory Visit | Attending: Family Medicine | Admitting: Family Medicine

## 2024-08-04 DIAGNOSIS — Z1231 Encounter for screening mammogram for malignant neoplasm of breast: Secondary | ICD-10-CM

## 2024-08-09 ENCOUNTER — Ambulatory Visit (HOSPITAL_COMMUNITY)
Admission: RE | Admit: 2024-08-09 | Discharge: 2024-08-09 | Disposition: A | Discharge status: Home / Self Care | Source: Ambulatory Visit | Attending: Emergency Medicine | Admitting: Emergency Medicine

## 2024-08-09 DIAGNOSIS — E782 Mixed hyperlipidemia: Secondary | ICD-10-CM | POA: Insufficient documentation

## 2024-08-09 DIAGNOSIS — I6523 Occlusion and stenosis of bilateral carotid arteries: Secondary | ICD-10-CM | POA: Insufficient documentation

## 2024-08-10 ENCOUNTER — Ambulatory Visit: Payer: Self-pay | Admitting: Emergency Medicine

## 2024-09-15 ENCOUNTER — Ambulatory Visit: Payer: Self-pay | Admitting: Internal Medicine

## 2024-09-15 ENCOUNTER — Ambulatory Visit: Payer: Self-pay | Admitting: Adult Health

## 2024-09-15 ENCOUNTER — Telehealth: Payer: Self-pay | Admitting: Internal Medicine

## 2024-09-15 DIAGNOSIS — J441 Chronic obstructive pulmonary disease with (acute) exacerbation: Secondary | ICD-10-CM

## 2024-09-15 MED ORDER — PREDNISONE 20 MG PO TABS: 20.0000 mg | ORAL_TABLET | Freq: Every day | ORAL | 0 refills | Status: DC

## 2024-09-15 NOTE — Telephone Encounter (Signed)
 COPD flare - Rx for Prednisone  20mg  daily for 5 days Continue on current regimen if not improved will need ov .  Zithromax  is on allergy  list, not sent in per request.  Delsym 2 tsp Twice daily  As needed  cough  Please contact office for sooner follow up if symptoms do not improve or worsen or seek emergency care

## 2024-09-15 NOTE — Telephone Encounter (Signed)
 Patient informed. NFN

## 2024-09-15 NOTE — Telephone Encounter (Signed)
 FYI Only or Action Required?: Action required by provider: medication refill request.  Patient is followed in Pulmonology for COPD, last seen on 06/09/2024 by Parrett, Madelin RAMAN, NP.  Called Nurse Triage reporting Cough.  Symptoms began 2 days ago.  Interventions attempted: OTC medications: Alka-seltzer and Nebulizer treatments.  Symptoms are: stable.  Triage Disposition: Home Care  Patient/caregiver understands and will follow disposition?: Yes Reason for Disposition  Cough  Answer Assessment - Initial Assessment Questions Patient originally sent in refill request for steroid and abx not active on med list. This RN triaged patient for symptoms. Denies testing for covid or flu. Using Alka-seltzer plus, using budesonide  neb 2x daily, and ipratropium-albuterol  neb. Patient requesting predniSONE  (DELTASONE ) 10 MG tablet and azithromycin  (ZITHROMAX ) 250 MG tablet to [facility]. Please advise  1. ONSET: When did the cough begin?      2 days ago  2. SEVERITY: How bad is the cough today?      Gotten worse last night  3. SPUTUM: Describe the color of your sputum (e.g., none, dry cough; clear, white, yellow, green)     Yellowish-green  4. DIFFICULTY BREATHING: Are you having difficulty breathing? If Yes, ask: How bad is it? (e.g., mild, moderate, severe)      Denies  5. FEVER: Do you have a fever? If Yes, ask: What is your temperature, how was it measured, and when did it start?     Denies, hasn't checked but does not feel like she has one  6. CARDIAC HISTORY: Do you have any history of heart disease? (e.g., heart attack, congestive heart failure)      Denies  7. LUNG HISTORY: Do you have any history of lung disease?  (e.g., pulmonary embolus, asthma, emphysema)     Asthma, states has some breathing problems now  8. OTHER SYMPTOMS: Do you have any other symptoms? (e.g., runny nose, wheezing, chest pain)       Chest congestion, mild sinus  congestion  Protocols used: Cough - Acute Productive-A-AH

## 2024-09-15 NOTE — Telephone Encounter (Signed)
 Copied from CRM #[id number]. Topic: Clinical - Medication Refill >> Sep 15, 2024  9:46 AM [name] wrote: Medication: predniSONE  (DELTASONE ) 10 MG tablet  azithromycin  (ZITHROMAX ) 250 MG tablet  Both meds are showing discontinued, pt stated she's feeling like she has a cold and this would help. Pt hasn't been seen since April of 2024.  Has the patient contacted their pharmacy? Yes (Agent: If no, request that the patient contact the pharmacy for the refill. If patient does not wish to contact the pharmacy document the reason why and proceed with request.) (Agent: If yes, when and what did the pharmacy advise?)  This is the patient's preferred pharmacy:  [facility] - [place], [place] - [address] [place] [address] Phone: [phone] Fax: [phone]  Is this the correct pharmacy for this prescription? Yes If no, delete pharmacy and type the correct one.   Has the prescription been filled recently? No  Is the patient out of the medication? Yes  Has the patient been seen for an appointment in the last year OR does the patient have an upcoming appointment? No  Can we respond through MyChart? No  Agent: Please be advised that Rx refills may take up to 3 business days. We ask that you follow-up with your pharmacy.

## 2024-09-15 NOTE — Telephone Encounter (Signed)
 FYI Only or Action Required?: FYI only for provider: med not at pharmacy, on call provider contacted.  Patient was last seen in primary care on .  Called Nurse Triage reporting Medication Problem.  Symptoms began n/a.  Interventions attempted: Other: n/a.  Symptoms are: n/a.  Triage Disposition: Call PCP Now  Patient/caregiver understands and will follow disposition?:    Copied from CRM #[id number]. Topic: Clinical - Prescription Issue >> Sep 15, 2024  2:30 PM Leila BROCKS wrote: Reason for CRM: Patient [phone] states medication was suppose to be sent and patient is at [facility] pharmacy now and there's no medication. Unable to reach CAL before the office closed, tried a few times. Please advise.   [facility] - [place], [place] - [address] [place] [address] Phone: [phone] Fax: [phone] Reason for Disposition  [1] Prescription not at pharmacy AND [2] was prescribed by doctor (or NP/PA) recently  (Exception: [facility] has access to EMR and prescription is recorded there. Go to Home Care and confirm prescription for pharmacy.)  Answer Assessment - Initial Assessment Questions Patient states medication was suppose to be sent and patient is at [facility] pharmacy now and there's no medication.   On call provider Dr. Catherine aware medication not at pharmacy.   Patient aware on call provider will address medication not at pharmacy, to give some time before she checks in with pharmacy for medication. Patient will call back for any other issues or concerns.  Protocols used: Medication Refill and Renewal Call-A-AH

## 2024-09-15 NOTE — Telephone Encounter (Signed)
 Submitted order for prednisone  as requested by [id number].

## 2024-09-15 NOTE — Telephone Encounter (Signed)
 Tammy,  Can you advise this?

## 2024-09-15 NOTE — Addendum Note (Signed)
 Addended by: ORLIE MADELIN RAMAN on: 09/15/2024 01:44 PM   Modules accepted: Orders

## 2024-09-17 ENCOUNTER — Encounter: Payer: Self-pay | Admitting: *Deleted

## 2024-09-17 ENCOUNTER — Ambulatory Visit: Admission: EM | Admit: 2024-09-17 | Discharge: 2024-09-17 | Disposition: A | Discharge status: Home / Self Care

## 2024-09-17 DIAGNOSIS — J441 Chronic obstructive pulmonary disease with (acute) exacerbation: Secondary | ICD-10-CM | POA: Diagnosis not present

## 2024-09-17 LAB — POC SOFIA SARS ANTIGEN FIA: SARS Coronavirus 2 Ag: NEGATIVE

## 2024-09-17 LAB — POCT INFLUENZA A/B
Influenza A, POC: NEGATIVE
Influenza B, POC: NEGATIVE

## 2024-09-17 MED ORDER — IPRATROPIUM-ALBUTEROL 0.5-2.5 (3) MG/3ML IN SOLN
3.0000 mL | Freq: Once | RESPIRATORY_TRACT | Status: AC
  Administered 2024-09-17: 3 mL via RESPIRATORY_TRACT

## 2024-09-17 NOTE — ED Provider Notes (Signed)
 [facility]    CSN: [id number] Arrival date & time: 09/17/24  0810      History   Chief Complaint Chief Complaint  Patient presents with   Cough    HPI Sharon Mcdaniel is a 88 y.o. female.   Pt with a hx of emphysema, presents today due to cough and chest congestion since Friday. Pt states that she has been using Mucinex , prednisone  20 mg from left over rx, and nebulizer treatments twice a day with no relief. Pt denies fever or orthopnea. Pt states that she is eating and drinking without issue.   The history is provided by the patient.  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   Postmenopausal 02/02/2022   Tick bite 02/02/2022   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   PVD (peripheral vascular disease) 04/27/2020   Chronic back pain 02/05/2018   Bilateral impacted cerumen 01/11/2018   Neck pain 01/11/2018   Referred otalgia of left ear 01/11/2018   Abnormal CT of the chest 06/11/2016   DOE (dyspnea on exertion) 06/10/2016   Cough 06/10/2016   HYPOTHYROIDISM 09/05/2007   ASTHMATIC BRONCHITIS, ACUTE 09/05/2007   EMPHYSEMA 09/05/2007   Asthmatic bronchitis , chronic (HCC) 09/05/2007   TEMPOROMANDIBULAR JOINT PAIN 09/05/2007    Past Surgical History:  Procedure Laterality Date   ABDOMINAL HYSTERECTOMY  1980   BACK SURGERY  07/2011   synovial cyst removal   INTRAOPERATIVE ARTERIOGRAM Right 04/28/2020   Procedure: INTRA OPERATIVE  ARTERIOGRAM;  Surgeon: Eliza Lonni RAMAN, MD;  Location: [facility] OR;  Service: Vascular;  Laterality: Right;   LEFT HEART CATH AND CORONARY ANGIOGRAPHY N/A 07/22/2022   Procedure: LEFT HEART CATH AND CORONARY ANGIOGRAPHY;  Surgeon: Claudene Victory ORN, MD;  Location: [facility] INVASIVE [facility] LAB;  Service: Cardiovascular;  Laterality: N/A;   THROMBECTOMY OF BYPASS GRAFT FEMORAL- POPLITEAL ARTERY Right 04/28/2020   Procedure: RIGHT FEMORAL-BELOW KNEE POPLITEAL BYPASS GRAFT USING NON-REVERSED GREATER SAPHENOUS VEIN GRAFT HARVESTED FROM RIGHT UPPER LEG. ;  Surgeon: Eliza Lonni RAMAN, MD;  Location: [facility] OR;  Service: Vascular;  Laterality: Right;   TUBAL LIGATION  1970    OB History   No obstetric history on file.      Home Medications    Prior to Admission medications   Medication Sig Start Date End Date Taking? Authorizing Provider  albuterol  (VENTOLIN  HFA) 108 (90 Base) MCG/ACT inhaler Inhale 1 puff into the lungs every 8 (eight) hours as needed for wheezing or shortness of breath.   Yes [provider]  Aspirin  81 MG CAPS Take 81 mg by mouth daily.   Yes [provider]  atorvastatin  (LIPITOR) 40 MG tablet TAKE 1 TABLET BY MOUTH DAILY AT BEDTIME 08/05/21  Yes Eliza Lonni RAMAN, MD  budesonide  (PULMICORT ) 0.5 MG/2ML nebulizer solution Take 2 mLs (0.5 mg total) by nebulization in the morning and at bedtime. 06/09/24  Yes Parrett, Tammy S, NP  clobetasol cream (TEMOVATE) 0.05 % Apply 1 Application topically 2 (two)  times daily.   Yes [provider]  clonazePAM  (KLONOPIN ) 0.5 MG tablet Take 0.25 mg by mouth at bedtime. 09/29/19  Yes [provider]  donepezil (ARICEPT) 5 MG tablet Take 5 mg by mouth daily. 08/15/20  Yes [provider]  estradiol  (ESTRACE ) 0.1 MG/GM vaginal cream Place 1 Applicatorful vaginally every Thursday. 09/05/20  Yes [provider]  famotidine  (PEPCID ) 20 MG tablet One after supper 07/07/22  Yes Wert, Michael B, MD  furosemide  (LASIX) 20 MG tablet Take 20 mg by mouth 2 (two) times daily. 06/25/22  Yes [provider]  ipratropium-albuterol  (DUONEB) 0.5-2.5 (3) MG/3ML SOLN Take 3 mLs by nebulization in the morning and at bedtime. 06/09/24  Yes Parrett, Tammy S, NP  levothyroxine  (SYNTHROID , LEVOTHROID) 112 MCG tablet Take 112 mcg by mouth daily.   Yes [provider]  losartan (COZAAR) 25 MG tablet Take 25 mg by mouth daily. 08/08/20  Yes [provider]  meclizine (ANTIVERT) 25 MG tablet Take 25 mg by mouth daily as needed for dizziness.   Yes [provider]  meloxicam (MOBIC) 7.5 MG tablet Take 7.5 mg by mouth daily. 08/21/22  Yes [provider]  mirtazapine  (REMERON ) 15 MG tablet Take 15 mg by mouth at bedtime. 04/26/20  Yes [provider]  montelukast  (SINGULAIR ) 10 MG tablet Take 1 tablet (10 mg total) by mouth daily. 06/09/24  Yes Parrett, Tammy S, NP  ondansetron  (ZOFRAN ) 8 MG tablet Take 8 mg by mouth every 8 (eight) hours as needed for nausea or vomiting.   Yes [provider]  pantoprazole  (PROTONIX ) 40 MG tablet TAKE 1 TABLET BY MOUTH DAILY. TAKE 30-60 MINUTES BEFORE FIRST MEAL OF THE DAY 01/01/23  Yes Darlean Ozell NOVAK, MD  predniSONE  (DELTASONE ) 20 MG tablet Take 1 tablet (20 mg total) by mouth daily with breakfast. 09/15/24  Yes Alghanim, Fahid, MD  rOPINIRole (REQUIP) 0.25 MG tablet Take 0.25-0.5 mg by mouth at bedtime as needed. 07/28/24  Yes [provider]  sucralfate (CARAFATE) 1 g tablet Take 1 g by mouth 4 (four) times daily -  with meals and at bedtime.   Yes [provider]  traMADol (ULTRAM) 50 MG tablet Take 25 mg by mouth every 8 (eight) hours as needed. 08/24/24  Yes [provider]  Vitamin D, Ergocalciferol, (DRISDOL) 1.25 MG (50000 UNIT) CAPS capsule Take 50,000 Units by mouth every Sunday. 12/12/20  Yes [provider]  arformoterol  (BROVANA ) 15 MCG/2ML NEBU Take 2 mLs (15 mcg total) by nebulization 2 (two)  times daily. 06/09/24   Parrett, Madelin RAMAN, NP  diltiazem  (CARDIZEM  CD) 120 MG 24 hr capsule Take 1 capsule (120 mg total) by mouth daily. 07/24/24   Swinyer, Rosaline HERO, NP  diltiazem  (CARDIZEM ) 30 MG tablet Take 1 tablet by mouth once daily as needed for palpitations 08/28/22   Swinyer, Rosaline HERO, NP  diphenhydrAMINE  (BENADRYL ) 25 MG tablet Take 50 mg by mouth daily as needed (bee stings).    [provider]  ibuprofen  (ADVIL ) 200 MG tablet Take 200-400 mg by mouth every 6 (six) hours as needed for moderate pain.    [provider]  nitroGLYCERIN  (NITROSTAT ) 0.4 MG SL tablet Place 1 tablet (0.4 mg total) under the tongue every 5 (five) minutes as needed for chest pain. 07/20/22   Swinyer, Rosaline HERO, NP    Family History Family History  Problem Relation Age of Onset   Lung cancer Mother    Rheum arthritis Mother  Asthma Father    Heart disease Father    Breast cancer Neg Hx     Social History Social History   Tobacco Use   Smoking status: Former    Current packs/day: 0.00    Average packs/day: 1 pack/day for 30.0 years (30.0 ttl pk-yrs)    Types: Cigarettes    Start date: 11/02/1973    Quit date: 11/03/2003    Years since quitting: 20.8   Smokeless tobacco: Never  Vaping Use   Vaping status: Never Used  Substance Use Topics   Alcohol  use: No   Drug use: No     Allergies   Betadine [povidone iodine], Fluconazole in dextrose , Gatifloxacin, Iodinated contrast media, Ioxaglate, Quinolones, Tequin, Alendronate, Bee venom, Tramadol, Zithromax  [azithromycin ], and Codeine   Review of Systems Review of Systems  Respiratory:  Positive for cough.      Physical Exam Triage Vital Signs ED Triage Vitals  Encounter Vitals Group     BP 09/17/24 0836 (!) 171/79     Girls Systolic BP Percentile --      Girls Diastolic BP Percentile --      Boys Systolic BP Percentile --      Boys Diastolic BP Percentile --      Pulse Rate 09/17/24 0836 67     Resp 09/17/24 0836 18      Temp 09/17/24 0836 97.6 F (36.4 C)     Temp Source 09/17/24 0836 Oral     SpO2 09/17/24 0836 93 %     Weight --      Height --      Head Circumference --      Peak Flow --      Pain Score 09/17/24 0834 0     Pain Loc --      Pain Education --      Exclude from Growth Chart --    No data found.  Updated Vital Signs BP (!) 171/79 (BP Location: Right Arm)   Pulse 67   Temp 97.6 F (36.4 C) (Oral)   Resp 18   SpO2 94%   Visual Acuity Right Eye Distance:   Left Eye Distance:   Bilateral Distance:    Right Eye Near:   Left Eye Near:    Bilateral Near:     Physical Exam Vitals and nursing note reviewed.  Constitutional:      General: She is not in acute distress.    Appearance: Normal appearance. She is not ill-appearing, toxic-appearing or diaphoretic.  Eyes:     General: No scleral icterus. Cardiovascular:     Rate and Rhythm: Normal rate and regular rhythm.     Heart sounds: Normal heart sounds.  Pulmonary:     Effort: Pulmonary effort is normal. No respiratory distress.     Breath sounds: Examination of the right-upper field reveals wheezing and rhonchi. Examination of the left-upper field reveals wheezing and rhonchi. Examination of the right-lower field reveals wheezing and rhonchi. Examination of the left-lower field reveals wheezing and rhonchi. Wheezing and rhonchi present.  Skin:    General: Skin is warm.  Neurological:     Mental Status: She is alert and oriented to person, place, and time.  Psychiatric:        Mood and Affect: Mood normal.        Behavior: Behavior normal.      UC Treatments / Results  Labs (all labs ordered are listed, but only abnormal results are displayed) Labs Reviewed  POC SOFIA SARS  ANTIGEN FIA - Normal  POCT INFLUENZA A/B - Normal    EKG   Radiology No results found.  Procedures Procedures (including critical care time)  Medications Ordered in UC Medications  ipratropium-albuterol  (DUONEB) 0.5-2.5 (3)  MG/3ML nebulizer solution 3 mL (3 mLs Nebulization Given 09/17/24 0859)    Initial Impression / Assessment and Plan / UC Course  I have reviewed the triage vital signs and the nursing notes.  Pertinent labs & imaging results that were available during my care of the patient were reviewed by me and considered in my medical decision making (see chart for details).    Final Clinical Impressions(s) / UC Diagnoses   Final diagnoses:  COPD with acute exacerbation [facility])     Discharge Instructions      Continue use of 20 mg steroid daily, use nebulizer treatment every 4 hrs until you feel better. We did not have xray capability in office today. If you experience increased shortness of breath, loss of appetite, or fever 100.5 or above, nausea, or vomiting we need to follow-up.     ED Prescriptions   None    PDMP not reviewed this encounter.   Andra Corean BROCKS, PA-C 09/17/24 [phone]

## 2024-09-17 NOTE — Discharge Instructions (Addendum)
 Continue use of 20 mg steroid daily, use nebulizer treatment every 4 hrs until you feel better. We did not have xray capability in office today. If you experience increased shortness of breath, loss of appetite, or fever 100.5 or above, nausea, or vomiting we need to follow-up.

## 2024-09-17 NOTE — ED Triage Notes (Signed)
 Pt reports productive cough and chest congestion since Friday. Denies fever. She has been taking mucinex  and prednisone  she had left over from an old rx- taking 20mg  twice a day.

## 2024-09-18 ENCOUNTER — Ambulatory Visit: Payer: Self-pay | Admitting: Internal Medicine

## 2024-09-18 NOTE — Telephone Encounter (Signed)
 I called [facility] and verified that they did not receive either prescription for Prednisone  that was sent in on Friday 11/14.  I gave a verbal order for the prednisone  1 tablet (20 mg) by mouth daily for 5 days.  Total tablets =5, no refills.  I spoke with the pharmacist Karyle.  He verbalized understanding.  I called and spoke with the patient and apologized that the prescription did not go through to her pharmacy.  I let her know I called and provided a verbal order to the pharmacist and they should be working on it now.  Advised to call the office if she is not feeling better after she completes the prednisone .  She verbalized understanding.  Nothing further needed.

## 2024-09-18 NOTE — Telephone Encounter (Signed)
 Nothing further needed

## 2024-09-18 NOTE — Telephone Encounter (Signed)
 FYI Only or Action Required?: Action required by provider: medication refill request. Prednisone  20 mg x5 days  Patient is followed in Pulmonology for COPD, last seen on 06/09/2024 by Parrett, Madelin RAMAN, NP.  Called Nurse Triage reporting Medication Problem.  Symptoms began several days ago.  Interventions attempted: Prescription medications: Left over prednisone .  Symptoms are: gradually improving.  Triage Disposition: Call Specialist Now (overriding Call PCP Now)  Patient/caregiver understands and will follow disposition?: Yes  Copied from CRM #[id number]. Topic: Clinical - Red Word Triage >> Sep 18, 2024  9:27 AM Leila BROCKS wrote: Red Word that prompted transfer to Nurse Triage: Patient [phone] called on Friday and states antibiotics and Prednisone  from NP, Parrett/Dr. Darlean was not sent to the pharmacy. Patient states the pharmacy does not have medications: antibiotics nor Prednisone . Patient states still having same symptoms from last week: wheezing, nasal and chest congestion Patient denies shortness of breath, dizziness, pain nor fever. Please advise.   [facility] - [place], [place] - [address] [place] [address] Phone: [phone] Fax: [phone] >> Sep 18, 2024  9:41 AM Leila C wrote: Patient cannot hold on any longer, asked for NT to call back at [phone]. Reason for Disposition  [1] Prescription not at pharmacy AND [2] was prescribed by doctor (or NP/PA) recently  (Exception: [facility]ager has access to EMR and prescription is recorded there. Go to Home Care and confirm for pharmacy.)  Answer Assessment - Initial Assessment Questions Pt calling to report rx for prednisone  prescribed by her pulm provider still not at her pharmacy [facility]. Pt submitted rx refill request for prednisone  and zithromax  which hadn't been prescribed since April 2024. Triage RN called and spoke with pt regarding  current symptoms. Message was sent to pts pulm office with plan to send rx for 20 mg prednisone  (zithromax  not prescribed as it is listed as pt allergy  for GI issues). Pt called nurse triage later on 11/14 stating med was not at pharmacy. Pulm office closed by that time and on-call provider contacted to send in rx. Rx was sent but pt reports still not at pharmacy. Called pts pharmacy and confirmed it it not there. Called Pulmonary Care at [facility] and spoke with [place], asked to have HP message sent over to see if script could be resent today. Pt reports her symptoms have improved a little and denies SOB. Has been using left-over prednisone  that she has at her house in the meantime. Also went to UC yesterday and advised nebs tx and to continue prednisone  20 mg daily x5 days. Sending message to clinic, advised UC or ED for worsening symptoms.  1. NAME of MEDICINE: What medicine(s) are you calling about?     Prednisone  20 mg x 5 days  2. QUESTION: What is your question? (e.g., double dose of medicine, side effect)     Medication still not at pharmacy after being sent in by on call provider on 11/14  3. PRESCRIBER: Who prescribed the medicine? Reason: if prescribed by specialist, call should be referred to that group.     On call provider Dr. Paula Alghanim  4. SYMPTOMS: Do you have any symptoms? If Yes, ask: What symptoms are you having?  How bad are the symptoms (e.g., mild, moderate, severe)     Pt states Chest and nasal congestion at this time, no SOB  Protocols used: Medication Question Call-A-AH

## 2024-09-18 NOTE — Telephone Encounter (Signed)
 Seen at [facility] on 11/6 for continued symptoms.  Nothing further needed.

## 2024-11-23 ENCOUNTER — Inpatient Hospital Stay (HOSPITAL_COMMUNITY)
Admission: EM | Admit: 2024-11-23 | Discharge: 2024-11-28 | DRG: 194 | Disposition: A | Discharge status: Home Health | Source: Ambulatory Visit | Attending: Student | Admitting: Student

## 2024-11-23 ENCOUNTER — Encounter (HOSPITAL_COMMUNITY): Payer: Self-pay | Admitting: Emergency Medicine

## 2024-11-23 ENCOUNTER — Other Ambulatory Visit: Payer: Self-pay

## 2024-11-23 ENCOUNTER — Emergency Department (HOSPITAL_COMMUNITY)

## 2024-11-23 DIAGNOSIS — K219 Gastro-esophageal reflux disease without esophagitis: Secondary | ICD-10-CM | POA: Diagnosis present

## 2024-11-23 DIAGNOSIS — J9601 Acute respiratory failure with hypoxia: Secondary | ICD-10-CM | POA: Diagnosis present

## 2024-11-23 DIAGNOSIS — Z7951 Long term (current) use of inhaled steroids: Secondary | ICD-10-CM

## 2024-11-23 DIAGNOSIS — Z91041 Radiographic dye allergy status: Secondary | ICD-10-CM

## 2024-11-23 DIAGNOSIS — E872 Acidosis, unspecified: Secondary | ICD-10-CM | POA: Diagnosis present

## 2024-11-23 DIAGNOSIS — Z885 Allergy status to narcotic agent status: Secondary | ICD-10-CM

## 2024-11-23 DIAGNOSIS — Z8249 Family history of ischemic heart disease and other diseases of the circulatory system: Secondary | ICD-10-CM

## 2024-11-23 DIAGNOSIS — Z79890 Hormone replacement therapy (postmenopausal): Secondary | ICD-10-CM

## 2024-11-23 DIAGNOSIS — E559 Vitamin D deficiency, unspecified: Secondary | ICD-10-CM | POA: Insufficient documentation

## 2024-11-23 DIAGNOSIS — Z79899 Other long term (current) drug therapy: Secondary | ICD-10-CM | POA: Diagnosis not present

## 2024-11-23 DIAGNOSIS — R0600 Dyspnea, unspecified: Principal | ICD-10-CM

## 2024-11-23 DIAGNOSIS — Z7982 Long term (current) use of aspirin: Secondary | ICD-10-CM

## 2024-11-23 DIAGNOSIS — I10 Essential (primary) hypertension: Secondary | ICD-10-CM | POA: Diagnosis present

## 2024-11-23 DIAGNOSIS — Z881 Allergy status to other antibiotic agents status: Secondary | ICD-10-CM | POA: Diagnosis not present

## 2024-11-23 DIAGNOSIS — J101 Influenza due to other identified influenza virus with other respiratory manifestations: Principal | ICD-10-CM | POA: Diagnosis present

## 2024-11-23 DIAGNOSIS — Z825 Family history of asthma and other chronic lower respiratory diseases: Secondary | ICD-10-CM | POA: Diagnosis not present

## 2024-11-23 DIAGNOSIS — J441 Chronic obstructive pulmonary disease with (acute) exacerbation: Secondary | ICD-10-CM | POA: Diagnosis present

## 2024-11-23 DIAGNOSIS — E039 Hypothyroidism, unspecified: Secondary | ICD-10-CM | POA: Diagnosis present

## 2024-11-23 DIAGNOSIS — J8410 Pulmonary fibrosis, unspecified: Secondary | ICD-10-CM | POA: Diagnosis present

## 2024-11-23 DIAGNOSIS — G3184 Mild cognitive impairment, so stated: Secondary | ICD-10-CM | POA: Diagnosis present

## 2024-11-23 DIAGNOSIS — R008 Other abnormalities of heart beat: Secondary | ICD-10-CM | POA: Diagnosis not present

## 2024-11-23 DIAGNOSIS — E782 Mixed hyperlipidemia: Secondary | ICD-10-CM | POA: Diagnosis present

## 2024-11-23 DIAGNOSIS — Z91030 Bee allergy status: Secondary | ICD-10-CM | POA: Diagnosis not present

## 2024-11-23 DIAGNOSIS — Z87891 Personal history of nicotine dependence: Secondary | ICD-10-CM | POA: Diagnosis not present

## 2024-11-23 DIAGNOSIS — I739 Peripheral vascular disease, unspecified: Secondary | ICD-10-CM | POA: Diagnosis present

## 2024-11-23 DIAGNOSIS — Z888 Allergy status to other drugs, medicaments and biological substances status: Secondary | ICD-10-CM | POA: Diagnosis not present

## 2024-11-23 LAB — BASIC METABOLIC PANEL WITH GFR
Anion gap: 15 (ref 5–15)
BUN: 21 mg/dL (ref 8–23)
CO2: 24 mmol/L (ref 22–32)
Calcium: 9 mg/dL (ref 8.9–10.3)
Chloride: 97 mmol/L — ABNORMAL LOW (ref 98–111)
Creatinine, Ser: 1.21 mg/dL — ABNORMAL HIGH (ref 0.44–1.00)
GFR, Estimated: 43 mL/min — ABNORMAL LOW
Glucose, Bld: 108 mg/dL — ABNORMAL HIGH (ref 70–99)
Potassium: 3.7 mmol/L (ref 3.5–5.1)
Sodium: 136 mmol/L (ref 135–145)

## 2024-11-23 LAB — I-STAT CG4 LACTIC ACID, ED: Lactic Acid, Venous: 2.7 mmol/L (ref 0.5–1.9)

## 2024-11-23 LAB — CBC
HCT: 43.6 % (ref 36.0–46.0)
Hemoglobin: 13.5 g/dL (ref 12.0–15.0)
MCH: 28.1 pg (ref 26.0–34.0)
MCHC: 31 g/dL (ref 30.0–36.0)
MCV: 90.6 fL (ref 80.0–100.0)
Platelets: 151 K/uL (ref 150–400)
RBC: 4.81 MIL/uL (ref 3.87–5.11)
RDW: 14.6 % (ref 11.5–15.5)
WBC: 14.6 K/uL — ABNORMAL HIGH (ref 4.0–10.5)
nRBC: 0 % (ref 0.0–0.2)

## 2024-11-23 LAB — RESP PANEL BY RT-PCR (RSV, FLU A&B, COVID)  RVPGX2
Influenza A by PCR: POSITIVE — AB
Influenza B by PCR: NEGATIVE
Resp Syncytial Virus by PCR: NEGATIVE
SARS Coronavirus 2 by RT PCR: NEGATIVE

## 2024-11-23 MED ORDER — ALPRAZOLAM 0.25 MG PO TABS
0.2500 mg | ORAL_TABLET | Freq: Every evening | ORAL | Status: AC | PRN
  Administered 2024-11-23 – 2024-11-25 (×2): 0.25 mg via ORAL
  Filled 2024-11-23 (×2): qty 1

## 2024-11-23 MED ORDER — ALBUTEROL SULFATE (2.5 MG/3ML) 0.083% IN NEBU
2.5000 mg | INHALATION_SOLUTION | RESPIRATORY_TRACT | Status: DC | PRN
  Administered 2024-11-25: 2.5 mg via RESPIRATORY_TRACT
  Filled 2024-11-23: qty 3

## 2024-11-23 MED ORDER — DOXYCYCLINE HYCLATE 100 MG PO TABS
100.0000 mg | ORAL_TABLET | Freq: Once | ORAL | Status: AC
  Administered 2024-11-23: 100 mg via ORAL
  Filled 2024-11-23: qty 1

## 2024-11-23 MED ORDER — SODIUM CHLORIDE 0.9 % IV SOLN
1.0000 g | INTRAVENOUS | Status: DC
  Administered 2024-11-23 – 2024-11-24 (×2): 1 g via INTRAVENOUS
  Filled 2024-11-23 (×2): qty 10

## 2024-11-23 MED ORDER — ENOXAPARIN SODIUM 40 MG/0.4ML IJ SOSY
40.0000 mg | PREFILLED_SYRINGE | INTRAMUSCULAR | Status: DC
  Administered 2024-11-23 – 2024-11-27 (×5): 40 mg via SUBCUTANEOUS
  Filled 2024-11-23 (×5): qty 0.4

## 2024-11-23 MED ORDER — PREDNISONE 20 MG PO TABS
40.0000 mg | ORAL_TABLET | Freq: Every day | ORAL | Status: AC
  Administered 2024-11-24 – 2024-11-28 (×5): 40 mg via ORAL
  Filled 2024-11-23 (×5): qty 2

## 2024-11-23 MED ORDER — PANTOPRAZOLE SODIUM 40 MG PO TBEC
40.0000 mg | DELAYED_RELEASE_TABLET | Freq: Every day | ORAL | Status: DC
  Administered 2024-11-24 – 2024-11-28 (×5): 40 mg via ORAL
  Filled 2024-11-23 (×5): qty 1

## 2024-11-23 MED ORDER — ACETAMINOPHEN 325 MG PO TABS: 650.0000 mg | ORAL_TABLET | Freq: Four times a day (QID) | ORAL | Status: DC | PRN

## 2024-11-23 MED ORDER — FAMOTIDINE 20 MG PO TABS: 20.0000 mg | ORAL_TABLET | Freq: Every day | ORAL | Status: DC

## 2024-11-23 MED ORDER — ONDANSETRON HCL 4 MG PO TABS: 4.0000 mg | ORAL_TABLET | Freq: Four times a day (QID) | ORAL | Status: DC | PRN

## 2024-11-23 MED ORDER — LEVOTHYROXINE SODIUM 112 MCG PO TABS
112.0000 ug | ORAL_TABLET | Freq: Every day | ORAL | Status: DC
  Administered 2024-11-24 – 2024-11-28 (×5): 112 ug via ORAL
  Filled 2024-11-23 (×5): qty 1

## 2024-11-23 MED ORDER — ATORVASTATIN CALCIUM 40 MG PO TABS
40.0000 mg | ORAL_TABLET | Freq: Every day | ORAL | Status: DC
  Administered 2024-11-23 – 2024-11-27 (×5): 40 mg via ORAL
  Filled 2024-11-23 (×5): qty 1

## 2024-11-23 MED ORDER — DONEPEZIL HCL 10 MG PO TABS
10.0000 mg | ORAL_TABLET | Freq: Every day | ORAL | Status: DC
  Administered 2024-11-23 – 2024-11-27 (×5): 10 mg via ORAL
  Filled 2024-11-23 (×5): qty 1

## 2024-11-23 MED ORDER — MIRTAZAPINE 15 MG PO TABS
15.0000 mg | ORAL_TABLET | Freq: Every day | ORAL | Status: DC
  Administered 2024-11-23 – 2024-11-27 (×5): 15 mg via ORAL
  Filled 2024-11-23 (×5): qty 1

## 2024-11-23 MED ORDER — LOSARTAN POTASSIUM 50 MG PO TABS
25.0000 mg | ORAL_TABLET | Freq: Every day | ORAL | Status: DC
  Administered 2024-11-23 – 2024-11-25 (×3): 25 mg via ORAL
  Filled 2024-11-23 (×3): qty 1

## 2024-11-23 MED ORDER — FUROSEMIDE 20 MG PO TABS
20.0000 mg | ORAL_TABLET | Freq: Every day | ORAL | Status: DC
  Administered 2024-11-23: 20 mg via ORAL
  Filled 2024-11-23: qty 1

## 2024-11-23 MED ORDER — DILTIAZEM HCL ER COATED BEADS 120 MG PO CP24
120.0000 mg | ORAL_CAPSULE | Freq: Every day | ORAL | Status: DC
  Administered 2024-11-24 – 2024-11-28 (×5): 120 mg via ORAL
  Filled 2024-11-23 (×6): qty 1

## 2024-11-23 MED ORDER — ALBUTEROL SULFATE (2.5 MG/3ML) 0.083% IN NEBU
10.0000 mg/h | INHALATION_SOLUTION | Freq: Once | RESPIRATORY_TRACT | Status: AC
  Administered 2024-11-23: 10 mg/h via RESPIRATORY_TRACT
  Filled 2024-11-23: qty 3

## 2024-11-23 MED ORDER — DOXYCYCLINE HYCLATE 100 MG PO TABS
100.0000 mg | ORAL_TABLET | Freq: Two times a day (BID) | ORAL | Status: DC
  Administered 2024-11-23 – 2024-11-28 (×10): 100 mg via ORAL
  Filled 2024-11-23 (×10): qty 1

## 2024-11-23 MED ORDER — IPRATROPIUM-ALBUTEROL 0.5-2.5 (3) MG/3ML IN SOLN
3.0000 mL | Freq: Two times a day (BID) | RESPIRATORY_TRACT | Status: DC
  Administered 2024-11-23 – 2024-11-25 (×4): 3 mL via RESPIRATORY_TRACT
  Filled 2024-11-23 (×4): qty 3

## 2024-11-23 MED ORDER — OSELTAMIVIR PHOSPHATE 30 MG PO CAPS
30.0000 mg | ORAL_CAPSULE | Freq: Every day | ORAL | Status: DC
  Administered 2024-11-23 – 2024-11-24 (×2): 30 mg via ORAL
  Filled 2024-11-23 (×2): qty 1

## 2024-11-23 MED ORDER — ASPIRIN 81 MG PO TBEC
81.0000 mg | DELAYED_RELEASE_TABLET | Freq: Every day | ORAL | Status: DC
  Administered 2024-11-23 – 2024-11-28 (×6): 81 mg via ORAL
  Filled 2024-11-23 (×6): qty 1

## 2024-11-23 MED ORDER — ACETAMINOPHEN 650 MG RE SUPP: 650.0000 mg | Freq: Four times a day (QID) | RECTAL | Status: DC | PRN

## 2024-11-23 MED ORDER — MONTELUKAST SODIUM 10 MG PO TABS
10.0000 mg | ORAL_TABLET | Freq: Every day | ORAL | Status: DC
  Administered 2024-11-23 – 2024-11-27 (×5): 10 mg via ORAL
  Filled 2024-11-23 (×5): qty 1

## 2024-11-23 MED ORDER — ONDANSETRON HCL 4 MG/2ML IJ SOLN: 4.0000 mg | Freq: Four times a day (QID) | INTRAMUSCULAR | Status: DC | PRN

## 2024-11-23 NOTE — ED Triage Notes (Signed)
 BIB EMS from doctor's office.  Reports illness since last Sat.  Hx of COPD.  SHOB noted on exertion at MD office. Sats in the 80s. Given a duoneb and 125 solu medrol .  Pt improved but office felt she needed a chest x-ray.  Left lower lung sounds decreased.

## 2024-11-23 NOTE — ED Notes (Signed)
 One set of blood cultures collected,  2nd line placed, will not give return

## 2024-11-23 NOTE — ED Provider Notes (Signed)
 " [place] EMERGENCY DEPARTMENT AT [facility] Provider Note   CSN: [id number] Arrival date & time: 11/23/24  1152     Patient presents with: Shortness of Breath   Sharon Mcdaniel is a 89 y.o. female.   89 year old female with history of COPD presents with shortness of breath times a week but worse over the last few days.  Seen by her doctor yesterday as well as today.  Yesterday she was started on doxycycline  and given albuterol  treatment and did improve slightly.  Today when she saw her doctor, she felt more dyspneic.  O2 sats were in the 80s.  She has endorsed wheezing.  No CHF or anginal type symptoms.  No vomiting or diarrhea.  Some subjective fever or chills.  EMS was called and patient given DuoNeb and Solu-Medrol  125 IV push.  Patient admitted before in the past for pneumonia.  She does not use oxygen on a regular basis       Prior to Admission medications  Medication Sig Start Date End Date Taking? Authorizing Provider  albuterol  (VENTOLIN  HFA) 108 (90 Base) MCG/ACT inhaler Inhale 1 puff into the lungs every 8 (eight) hours as needed for wheezing or shortness of breath.    [provider]  arformoterol  (BROVANA ) 15 MCG/2ML NEBU Take 2 mLs (15 mcg total) by nebulization 2 (two) times daily. 06/09/24   Parrett, Madelin RAMAN, NP  Aspirin  81 MG CAPS Take 81 mg by mouth daily.    [provider]  atorvastatin  (LIPITOR) 40 MG tablet TAKE 1 TABLET BY MOUTH DAILY AT BEDTIME 08/05/21   Eliza Lonni RAMAN, MD  budesonide  (PULMICORT ) 0.5 MG/2ML nebulizer solution Take 2 mLs (0.5 mg total) by nebulization in the morning and at bedtime. 06/09/24   Parrett, Madelin RAMAN, NP  clobetasol cream (TEMOVATE) 0.05 % Apply 1 Application topically 2 (two) times daily.    [provider]  clonazePAM  (KLONOPIN ) 0.5 MG tablet Take 0.25 mg by mouth at bedtime. 09/29/19   [provider]  diltiazem  (CARDIZEM  CD) 120 MG 24 hr capsule Take 1 capsule (120 mg total) by mouth  daily. 07/24/24   Swinyer, Rosaline HERO, NP  diltiazem  (CARDIZEM ) 30 MG tablet Take 1 tablet by mouth once daily as needed for palpitations 08/28/22   Swinyer, Rosaline HERO, NP  diphenhydrAMINE  (BENADRYL ) 25 MG tablet Take 50 mg by mouth daily as needed (bee stings).    [provider]  donepezil  (ARICEPT ) 5 MG tablet Take 5 mg by mouth daily. 08/15/20   [provider]  estradiol  (ESTRACE ) 0.1 MG/GM vaginal cream Place 1 Applicatorful vaginally every Thursday. 09/05/20   [provider]  famotidine  (PEPCID ) 20 MG tablet One after supper 07/07/22   Darlean Ozell NOVAK, MD  furosemide  (LASIX ) 20 MG tablet Take 20 mg by mouth 2 (two) times daily. 06/25/22   [provider]  ibuprofen  (ADVIL ) 200 MG tablet Take 200-400 mg by mouth every 6 (six) hours as needed for moderate pain.    [provider]  ipratropium-albuterol  (DUONEB) 0.5-2.5 (3) MG/3ML SOLN Take 3 mLs by nebulization in the morning and at bedtime. 06/09/24   Parrett, Madelin RAMAN, NP  levothyroxine  (SYNTHROID , LEVOTHROID) 112 MCG tablet Take 112 mcg by mouth daily.    [provider]  losartan  (COZAAR ) 25 MG tablet Take 25 mg by mouth daily. 08/08/20   [provider]  meclizine (ANTIVERT) 25 MG tablet Take 25 mg by mouth daily as needed for dizziness.    [provider]  meloxicam (MOBIC) 7.5 MG tablet Take 7.5 mg by mouth daily. 08/21/22   [provider]  mirtazapine  (REMERON ) 15 MG tablet Take 15 mg by mouth at bedtime. 04/26/20   [provider]  montelukast  (SINGULAIR ) 10 MG tablet Take 1 tablet (10 mg total) by mouth daily. 06/09/24   Parrett, Madelin RAMAN, NP  nitroGLYCERIN  (NITROSTAT ) 0.4 MG SL tablet Place 1 tablet (0.4 mg total) under the tongue every 5 (five) minutes as needed for chest pain. 07/20/22   Swinyer, Rosaline HERO, NP  ondansetron  (ZOFRAN ) 8 MG tablet Take 8 mg by mouth every 8 (eight) hours as needed for nausea or vomiting.    [provider]   pantoprazole  (PROTONIX ) 40 MG tablet TAKE 1 TABLET BY MOUTH DAILY. TAKE 30-60 MINUTES BEFORE FIRST MEAL OF THE DAY 01/01/23   Darlean Ozell NOVAK, MD  predniSONE  (DELTASONE ) 20 MG tablet Take 1 tablet (20 mg total) by mouth daily with breakfast. 09/15/24   Alghanim, Fahid, MD  rOPINIRole (REQUIP) 0.25 MG tablet Take 0.25-0.5 mg by mouth at bedtime as needed. 07/28/24   [provider]  sucralfate (CARAFATE) 1 g tablet Take 1 g by mouth 4 (four) times daily -  with meals and at bedtime.    [provider]  traMADol (ULTRAM) 50 MG tablet Take 25 mg by mouth every 8 (eight) hours as needed. 08/24/24   [provider]  Vitamin D, Ergocalciferol, (DRISDOL) 1.25 MG (50000 UNIT) CAPS capsule Take 50,000 Units by mouth every Sunday. 12/12/20   [provider]    Allergies: Betadine [povidone iodine], Fluconazole in dextrose , Gatifloxacin, Iodinated contrast media, Ioxaglate, Quinolones, Tequin, Alendronate, Bee venom, Tramadol, Zithromax  [azithromycin ], and Codeine    Review of Systems  All other systems reviewed and are negative.   Updated Vital Signs BP 126/82 (BP Location: Right Arm)   Pulse 72   Temp 97.7 F (36.5 C) (Oral)   Resp 17   SpO2 97%   Physical Exam Vitals and nursing note reviewed.  Constitutional:      General: She is not in acute distress.    Appearance: Normal appearance. She is well-developed. She is not toxic-appearing.  HENT:     Head: Normocephalic and atraumatic.  Eyes:     General: Lids are normal.     Conjunctiva/sclera: Conjunctivae normal.     Pupils: Pupils are equal, round, and reactive to light.  Neck:     Thyroid : No thyroid  mass.     Trachea: No tracheal deviation.  Cardiovascular:     Rate and Rhythm: Normal rate and regular rhythm.     Heart sounds: Normal heart sounds. No murmur heard.    No gallop.  Pulmonary:     Effort: Prolonged expiration and respiratory distress present.     Breath sounds: No stridor.  Examination of the right-upper field reveals decreased breath sounds and wheezing. Examination of the left-upper field reveals decreased breath sounds and wheezing. Decreased breath sounds and wheezing present. No rhonchi or rales.  Abdominal:     General: There is no distension.     Palpations: Abdomen is soft.     Tenderness: There is no abdominal tenderness. There is no rebound.  Musculoskeletal:        General: No tenderness. Normal range of motion.     Cervical back: Normal range of motion and neck supple.  Skin:    General: Skin is warm and dry.     Findings: No abrasion or rash.  Neurological:     Mental Status: She is alert and oriented to person, place, and time. Mental status is at baseline.     GCS: GCS eye subscore is 4. GCS verbal subscore is 5. GCS motor subscore is 6.     Cranial Nerves: No cranial nerve deficit.     Sensory: No sensory deficit.     Motor: Motor function is intact.  Psychiatric:        Attention and Perception: Attention normal.        Speech: Speech normal.        Behavior: Behavior normal.     (all labs ordered are listed, but only abnormal results are displayed) Labs Reviewed  CBC - Abnormal; Notable for the following components:      Result Value   WBC 14.6 (*)    All other components within normal limits  RESP PANEL BY RT-PCR (RSV, FLU A&B, COVID)  [id number]  BASIC METABOLIC PANEL WITH GFR    EKG: EKG Interpretation Date/Time:  Thursday November 23 2024 13:06:52 EST Ventricular Rate:  67 PR Interval:  158 QRS Duration:  89 QT Interval:  455 QTC Calculation: 481 R Axis:   -15  Text Interpretation: Sinus rhythm Probable left atrial enlargement Borderline left axis deviation RSR' in V1 or V2, probably normal variant Confirmed by Dasie Faden ([id number]) on 11/23/2024 2:11:44 PM  Radiology: No results found.   Procedures   Medications Ordered in the ED - No data to display                                  Medical Decision  Making Amount and/or Complexity of Data Reviewed Labs: ordered. Radiology: ordered.  Risk Prescription drug management.   Patient's EKG shows sinus rhythm.  X-ray of the chest showed no active disease but clinically I suspect that she has pneumonia.  She does have an elevation of her lactate. She also has leukocytosis on her CBC which could be from steroid use.  I will start patient on Rocephin  and give oral doxycycline  as she has allergy  to azithromycin .  Patient does require 2 L of oxygen at this time.  Suspect that she has a COPD exacerbation.  Patient received second nitro prior to arrival here and I did give patient a 10 mg continuous albuterol  treatment.  She requires admission at this time   CRITICAL CARE Performed by: Faden ONEIDA Dasie Total critical care time: 50 minutes Critical care time was exclusive of separately billable procedures and treating other patients. Critical care was necessary to treat or prevent imminent or life-threatening deterioration. Critical care was time spent personally by me on the following activities: development of treatment plan with patient and/or surrogate as well as nursing, discussions with consultants, evaluation of patient's response to treatment, examination of patient, obtaining history from patient or surrogate, ordering and performing treatments and interventions, ordering and review of laboratory studies, ordering and review of radiographic studies, pulse oximetry and re-evaluation of patient's condition.     Final diagnoses:  None    ED Discharge Orders     None          Dasie Faden, MD 11/23/24 1414  "

## 2024-11-23 NOTE — H&P (Signed)
 " History and Physical    Patient: Sharon Mcdaniel FMW:[id number] DOB: June 06, 1936 DOA: 11/23/2024 DOS: the patient was seen and examined on 11/23/2024 PCP: Burney Darice LITTIE, MD  Patient coming from: Home  Chief Complaint:  Chief Complaint  Patient presents with   Shortness of Breath   HPI: Sharon Mcdaniel is a 89 y.o. female with medical history significant of COPD, asthmatic bronchitis, allergy  rhinitis, postinflammatory pulmonary fibrosis, former smoker quit in 2005, hypothyroidism, peripheral vascular disease, hyperlipidemia who presented to the emergency department with complaints of dyspnea, productive cough, wheezing, sore throat, fatigue and malaise.  She was seen by one of her doctors who put her on prednisone  and doxycycline  yesterday without significant relief.  She was exposed to family members who have URI symptoms.  Positive subjective fever and chills.  No chest pain, palpitations, diaphoresis, PND, orthopnea or pitting edema of the lower extremities.  No abdominal pain, nausea, emesis, diarrhea, constipation, melena or hematochezia.  No flank pain, dysuria, frequency or hematuria.  No polyuria, polydipsia, polyphagia or blurred vision.   Lab work: CBC showed a white count of 14.6, Hemann 13.5 g/dL and platelets 848.  Influenza A PCR was positive.  Lactic acid 2.7 mmol/L (post albuterol  10 mg neb).  BMP showed chloride of 97 mmol/L, glucose 108 and creatinine 1.21 mg/dL the rest of the CMP measurements were normal.  Imaging: 2 view chest radiograph with no active cardiopulmonary disease.  Heart size and mediastinal contours are within normal limits.  ED course: Initial vital signs were temperature 97.7 F, pulse 72, respirations 17, BP 126/82 mmHg and O2 sat 97% on room air.  The patient received a DuoNeb and Solu-Medrol  with EMS.  She received a continuous 10 mg albuterol  neb, started on ceftriaxone  and doxycycline .   Review of Systems: As mentioned in the history of present illness. All  other systems reviewed and are negative. Past Medical History:  Diagnosis Date   COPD (chronic obstructive pulmonary disease) (HCC)    Peripheral vascular disease    Past Surgical History:  Procedure Laterality Date   ABDOMINAL HYSTERECTOMY  1980   BACK SURGERY  07/2011   synovial cyst removal   INTRAOPERATIVE ARTERIOGRAM Right 04/28/2020   Procedure: INTRA OPERATIVE ARTERIOGRAM;  Surgeon: Eliza Lonni RAMAN, MD;  Location: [facility] OR;  Service: Vascular;  Laterality: Right;   LEFT HEART CATH AND CORONARY ANGIOGRAPHY N/A 07/22/2022   Procedure: LEFT HEART CATH AND CORONARY ANGIOGRAPHY;  Surgeon: Claudene Victory ORN, MD;  Location: [facility] INVASIVE CV LAB;  Service: Cardiovascular;  Laterality: N/A;   THROMBECTOMY OF BYPASS GRAFT FEMORAL- POPLITEAL ARTERY Right 04/28/2020   Procedure: RIGHT FEMORAL-BELOW KNEE POPLITEAL BYPASS GRAFT USING NON-REVERSED GREATER SAPHENOUS VEIN GRAFT HARVESTED FROM RIGHT UPPER LEG. ;  Surgeon: Eliza Lonni RAMAN, MD;  Location: [facility] OR;  Service: Vascular;  Laterality: Right;   TUBAL LIGATION  1970   Social History:  reports that she quit smoking about 21 years ago. Her smoking use included cigarettes. She started smoking about 51 years ago. She has a 30 pack-year smoking history. She has never used smokeless tobacco. She reports that she does not drink alcohol  and does not use drugs.  Allergies[1]  Family History  Problem Relation Age of Onset   Lung cancer Mother    Rheum arthritis Mother    Asthma Father    Heart disease Father    Breast cancer Neg Hx     Prior to Admission medications  Medication Sig Start Date End Date Taking? Authorizing  Provider  albuterol  (VENTOLIN  HFA) 108 (90 Base) MCG/ACT inhaler Inhale 1 puff into the lungs every 8 (eight) hours as needed for wheezing or shortness of breath.    [provider]  arformoterol  (BROVANA ) 15 MCG/2ML NEBU Take 2 mLs (15 mcg total) by nebulization 2 (two) times daily. 06/09/24   Parrett, Madelin RAMAN, NP   Aspirin  81 MG CAPS Take 81 mg by mouth daily.    [provider]  atorvastatin  (LIPITOR) 40 MG tablet TAKE 1 TABLET BY MOUTH DAILY AT BEDTIME 08/05/21   Eliza Lonni RAMAN, MD  budesonide  (PULMICORT ) 0.5 MG/2ML nebulizer solution Take 2 mLs (0.5 mg total) by nebulization in the morning and at bedtime. 06/09/24   Parrett, Madelin RAMAN, NP  clobetasol cream (TEMOVATE) 0.05 % Apply 1 Application topically 2 (two) times daily.    [provider]  clonazePAM  (KLONOPIN ) 0.5 MG tablet Take 0.25 mg by mouth at bedtime. 09/29/19   [provider]  diltiazem  (CARDIZEM  CD) 120 MG 24 hr capsule Take 1 capsule (120 mg total) by mouth daily. 07/24/24   Swinyer, Rosaline HERO, NP  diltiazem  (CARDIZEM ) 30 MG tablet Take 1 tablet by mouth once daily as needed for palpitations 08/28/22   Swinyer, Rosaline HERO, NP  diphenhydrAMINE  (BENADRYL ) 25 MG tablet Take 50 mg by mouth daily as needed (bee stings).    [provider]  donepezil  (ARICEPT ) 5 MG tablet Take 5 mg by mouth daily. 08/15/20   [provider]  estradiol  (ESTRACE ) 0.1 MG/GM vaginal cream Place 1 Applicatorful vaginally every Thursday. 09/05/20   [provider]  famotidine  (PEPCID ) 20 MG tablet One after supper 07/07/22   Darlean Ozell NOVAK, MD  furosemide  (LASIX ) 20 MG tablet Take 20 mg by mouth 2 (two) times daily. 06/25/22   [provider]  ibuprofen  (ADVIL ) 200 MG tablet Take 200-400 mg by mouth every 6 (six) hours as needed for moderate pain.    [provider]  ipratropium-albuterol  (DUONEB) 0.5-2.5 (3) MG/3ML SOLN Take 3 mLs by nebulization in the morning and at bedtime. 06/09/24   Parrett, Madelin RAMAN, NP  levothyroxine  (SYNTHROID , LEVOTHROID) 112 MCG tablet Take 112 mcg by mouth daily.    [provider]  losartan  (COZAAR ) 25 MG tablet Take 25 mg by mouth daily. 08/08/20   [provider]  meclizine (ANTIVERT) 25 MG tablet Take 25 mg by mouth daily as needed for dizziness.     [provider]  meloxicam (MOBIC) 7.5 MG tablet Take 7.5 mg by mouth daily. 08/21/22   [provider]  mirtazapine  (REMERON ) 15 MG tablet Take 15 mg by mouth at bedtime. 04/26/20   [provider]  montelukast  (SINGULAIR ) 10 MG tablet Take 1 tablet (10 mg total) by mouth daily. 06/09/24   Parrett, Madelin RAMAN, NP  nitroGLYCERIN  (NITROSTAT ) 0.4 MG SL tablet Place 1 tablet (0.4 mg total) under the tongue every 5 (five) minutes as needed for chest pain. 07/20/22   Swinyer, Rosaline HERO, NP  ondansetron  (ZOFRAN ) 8 MG tablet Take 8 mg by mouth every 8 (eight) hours as needed for nausea or vomiting.    [provider]  pantoprazole  (PROTONIX ) 40 MG tablet TAKE 1 TABLET BY MOUTH DAILY. TAKE 30-60 MINUTES BEFORE FIRST MEAL OF THE DAY 01/01/23   Darlean Ozell NOVAK, MD  predniSONE  (DELTASONE ) 20 MG tablet Take 1 tablet (20 mg total) by mouth daily with breakfast. 09/15/24   Alghanim, Fahid, MD  rOPINIRole (REQUIP) 0.25 MG tablet Take 0.25-0.5 mg by  mouth at bedtime as needed. 07/28/24   [provider]  sucralfate (CARAFATE) 1 g tablet Take 1 g by mouth 4 (four) times daily -  with meals and at bedtime.    [provider]  traMADol (ULTRAM) 50 MG tablet Take 25 mg by mouth every 8 (eight) hours as needed. 08/24/24   [provider]  Vitamin D, Ergocalciferol, (DRISDOL) 1.25 MG (50000 UNIT) CAPS capsule Take 50,000 Units by mouth every Sunday. 12/12/20   [provider]    Physical Exam: Vitals:   11/23/24 1203 11/23/24 1308  BP: 126/82 (!) 154/63  Pulse: 72 68  Resp: 17 19  Temp: 97.7 F (36.5 C)   TempSrc: Oral   SpO2: 97% 98%   Physical Exam Vitals and nursing note reviewed.  Constitutional:      General: She is awake. She is not in acute distress.    Appearance: She is well-developed. She is ill-appearing.  HENT:     Head: Normocephalic.     Nose: No rhinorrhea.     Mouth/Throat:     Mouth: Mucous membranes are moist.  Eyes:      General: No scleral icterus.    Pupils: Pupils are equal, round, and reactive to light.  Neck:     Vascular: No JVD.  Cardiovascular:     Rate and Rhythm: Normal rate and regular rhythm.     Heart sounds: S1 normal and S2 normal.  Pulmonary:     Effort: No tachypnea or accessory muscle usage.     Breath sounds: Decreased breath sounds, wheezing, rhonchi and rales present.  Abdominal:     General: Bowel sounds are normal. There is no distension.     Palpations: Abdomen is soft.     Tenderness: There is no abdominal tenderness. There is no right CVA tenderness or left CVA tenderness.  Musculoskeletal:     Cervical back: Neck supple.     Right lower leg: No edema.     Left lower leg: No edema.  Skin:    General: Skin is warm and dry.  Neurological:     General: No focal deficit present.     Mental Status: She is alert and oriented to person, place, and time.  Psychiatric:        Mood and Affect: Mood normal.        Behavior: Behavior normal. Behavior is cooperative.     Data Reviewed:  Results are pending, will review when available.  03/04/2022 echocardiogram report. IMPRESSIONS:   1. Left ventricular ejection fraction by 3D volume is 73 %. The left  ventricle has normal function. The left ventricle has no regional wall  motion abnormalities. There is mild concentric left ventricular  hypertrophy. Left ventricular diastolic  parameters were normal.   2. Right ventricular systolic function is normal. The right ventricular  size is normal.   3. The mitral valve is normal in structure. Mild mitral valve  regurgitation. No evidence of mitral stenosis.   4. The aortic valve is normal in structure. Aortic valve regurgitation is  mild. Aortic valve sclerosis is present, with no evidence of aortic valve  stenosis. Aortic regurgitation PHT measures 712 msec.   5. There is borderline dilatation of the aortic root, measuring 37 mm.   6. The inferior vena cava is normal in size  with greater than 50%  respiratory variability, suggesting right atrial pressure of 3 mmHg.   EKG: Vent. rate 67 BPM  PR interval 158 ms  QRS  duration 89 ms  QT/QTcB 455/481 ms  P-R-T axes 72 -15 62  Sinus rhythm  Probable left atrial enlargement  Borderline left axis deviation  RSR' in V1 or V2, probably normal variant  Assessment and Plan: Principal Problem:   Acute respiratory failure with hypoxia (HCC) In the setting of:   COPD with acute exacerbation (HCC) Due to:   Influenza A Admit to telemetry/inpatient. Continue supplemental oxygen. Scheduled and as needed bronchodilators. Continue ceftriaxone  1 g IVPB daily. Continue doxycycline  100 mg p.o. twice daily. Begin Tamiflu  30 mg p.o. daily. Begin prednisone  40 mg p.o. daily in AM. Check sputum Gram stain, culture and sensitivity. Follow-up blood culture and sensitivity. Follow-up CBC and chemistry in the morning.  Active Problems:   Hypothyroidism Continue levothyroxine  112 mcg p.o. daily.    Mixed hyperlipidemia Continue atorvastatin  40 mg p.o. daily.    Gastroesophageal reflux disease Follow-up continue pantoprazole  40 mg p.o. daily.    Essential hypertension Continue diltiazem  120 mg p.o. daily Continue losartan  25 mg p.o. daily.     Advance Care Planning:   Code Status: Full Code   Consults:   Family Communication:   Severity of Illness: The appropriate patient status for this patient is INPATIENT. Inpatient status is judged to be reasonable and necessary in order to provide the required intensity of service to ensure the patient's safety. The patient's presenting symptoms, physical exam findings, and initial radiographic and laboratory data in the context of their chronic comorbidities is felt to place them at high risk for further clinical deterioration. Furthermore, it is not anticipated that the patient will be medically stable for discharge from the hospital within 2 midnights of admission.   * I  certify that at the point of admission it is my clinical judgment that the patient will require inpatient hospital care spanning beyond 2 midnights from the point of admission due to high intensity of service, high risk for further deterioration and high frequency of surveillance required.*  Author: Alm Dorn Castor, MD 11/23/2024 2:22 PM  For on call review [email or web address].   This document was prepared using Dragon voice recognition software and may contain some unintended transcription errors.     [1]  Allergies Allergen Reactions   Betadine [Povidone Iodine] Rash   Fluconazole In Dextrose  Hives and Shortness Of Breath   Gatifloxacin Hives and Shortness Of Breath   Iodinated Contrast Media Hives, Shortness Of Breath and Rash    States she tolerates betadine   Ioxaglate Hives and Shortness Of Breath   Quinolones Hives and Shortness Of Breath   Tequin Hives and Shortness Of Breath   Alendronate     tingling   Bee Venom Hives   Tramadol     jittery   Zithromax  [Azithromycin ]     GI Upset   Codeine Other (See Comments)    Makes her jittery    "

## 2024-11-23 NOTE — ED Notes (Signed)
 Lactic 2.7, results given to EDP

## 2024-11-24 ENCOUNTER — Encounter (HOSPITAL_COMMUNITY): Payer: Self-pay | Admitting: *Deleted

## 2024-11-24 DIAGNOSIS — E782 Mixed hyperlipidemia: Secondary | ICD-10-CM

## 2024-11-24 DIAGNOSIS — E872 Acidosis, unspecified: Secondary | ICD-10-CM | POA: Diagnosis not present

## 2024-11-24 DIAGNOSIS — E039 Hypothyroidism, unspecified: Secondary | ICD-10-CM

## 2024-11-24 DIAGNOSIS — I10 Essential (primary) hypertension: Secondary | ICD-10-CM

## 2024-11-24 DIAGNOSIS — J9601 Acute respiratory failure with hypoxia: Secondary | ICD-10-CM | POA: Diagnosis not present

## 2024-11-24 DIAGNOSIS — J441 Chronic obstructive pulmonary disease with (acute) exacerbation: Secondary | ICD-10-CM | POA: Diagnosis not present

## 2024-11-24 DIAGNOSIS — K219 Gastro-esophageal reflux disease without esophagitis: Secondary | ICD-10-CM

## 2024-11-24 DIAGNOSIS — J101 Influenza due to other identified influenza virus with other respiratory manifestations: Secondary | ICD-10-CM

## 2024-11-24 LAB — BLOOD CULTURE ID PANEL (REFLEXED) - BCID2

## 2024-11-24 LAB — COMPREHENSIVE METABOLIC PANEL WITH GFR
ALT: 15 U/L (ref 0–44)
AST: 26 U/L (ref 15–41)
Albumin: 3.7 g/dL (ref 3.5–5.0)
Alkaline Phosphatase: 89 U/L (ref 38–126)
Anion gap: 15 (ref 5–15)
BUN: 23 mg/dL (ref 8–23)
CO2: 24 mmol/L (ref 22–32)
Calcium: 9 mg/dL (ref 8.9–10.3)
Chloride: 99 mmol/L (ref 98–111)
Creatinine, Ser: 1.04 mg/dL — ABNORMAL HIGH (ref 0.44–1.00)
GFR, Estimated: 52 mL/min — ABNORMAL LOW
Glucose, Bld: 105 mg/dL — ABNORMAL HIGH (ref 70–99)
Potassium: 3.6 mmol/L (ref 3.5–5.1)
Sodium: 137 mmol/L (ref 135–145)
Total Bilirubin: 0.2 mg/dL (ref 0.0–1.2)
Total Protein: 6.4 g/dL — ABNORMAL LOW (ref 6.5–8.1)

## 2024-11-24 LAB — CBC
HCT: 39.9 % (ref 36.0–46.0)
Hemoglobin: 12.7 g/dL (ref 12.0–15.0)
MCH: 28 pg (ref 26.0–34.0)
MCHC: 31.8 g/dL (ref 30.0–36.0)
MCV: 88.1 fL (ref 80.0–100.0)
Platelets: 154 K/uL (ref 150–400)
RBC: 4.53 MIL/uL (ref 3.87–5.11)
RDW: 14.7 % (ref 11.5–15.5)
WBC: 8.7 K/uL (ref 4.0–10.5)
nRBC: 0 % (ref 0.0–0.2)

## 2024-11-24 LAB — LACTIC ACID, PLASMA: Lactic Acid, Venous: 3.4 mmol/L (ref 0.5–1.9)

## 2024-11-24 LAB — PROCALCITONIN: Procalcitonin: 0.11 ng/mL

## 2024-11-24 MED ORDER — OSELTAMIVIR PHOSPHATE 30 MG PO CAPS
30.0000 mg | ORAL_CAPSULE | Freq: Two times a day (BID) | ORAL | Status: AC
  Administered 2024-11-24 – 2024-11-27 (×7): 30 mg via ORAL
  Filled 2024-11-24 (×7): qty 1

## 2024-11-24 MED ORDER — SODIUM CHLORIDE 0.9 % IV BOLUS
500.0000 mL | Freq: Once | INTRAVENOUS | Status: AC
  Administered 2024-11-24: 500 mL via INTRAVENOUS

## 2024-11-24 MED ORDER — SODIUM CHLORIDE 0.9 % IV SOLN: INTRAVENOUS | Status: AC

## 2024-11-24 NOTE — Progress Notes (Signed)
 " PROGRESS NOTE  Sharon Mcdaniel FMW:[id number] DOB: 03/30/1936   PCP: Burney Darice LITTIE, MD  Patient is from: Home.  Lives with daughter.  Independently ambulates at baseline.  DOA: 11/23/2024 LOS: 1  Chief complaints Chief Complaint  Patient presents with   Shortness of Breath     Brief Narrative / Interim history: 89 year old F with PMH of COPD/asthma, pulmonary fibrosis, allergic rhinitis, prior tobacco use that she quit in 2005, PVD, HLD, hypothyroidism and mild cognitive impairment on Aricept  brought to ED by EMS due to shortness of breath, productive cough, wheezing, sore throat, fatigue, malaise, fever and chills, and admitted with working diagnosis of acute respiratory failure with hypoxia due to COPD exacerbation in the setting of influenza A infection.  Patient was started on prednisone  and doxycycline  the day prior to presentation without significant relief.  Patient received IV Solu-Medrol  and DuoNebs en route to ED  In ED, briefly tachypneic to 30s.  No oxygen requirement.  Slightly elevated BP.  WBC 14.6. Cr 1.21.  Otherwise, BMP and CBC without significant finding.  Lactic acid 2.7.  Influenza A PCR positive.  Influenza B, COVID-19 and RSV nonreactive.  CXR without acute finding.  Blood cultures drawn.  Patient was started on Tamiflu , nebulizers, ceftriaxone  and doxycycline  and admitted for further care.   Subjective: Seen and examined earlier this morning.  No major events overnight or this morning.  Continues to endorse shortness of breath, productive cough with yellowish phlegm and weakness.  Denies chest pain, GI or UTI symptoms.   Assessment and plan: Influenza A infection COPD with acute exacerbation due to influenza A infection Acute respiratory failure ruled out-no documented desaturation. -CXR without acute finding.  No oxygen requirement. -Blood cultures NGTD.  Has lactic acidosis but not toxic. -Continue prednisone , scheduled and as needed nebulizers -Will  continue ceftriaxone  and doxycycline  given productive cough with yellowish phlegm. -Wean oxygen to room air. -Mucolytic's, antitussive , incentive spirometry, flutter valve -Continue PPI for GI prophylaxis -OOB, ambulatory saturation, PT/OT  History of postinflammatory pulmonary fibrosis? High-resolution CT in 2017 with no evidence of ILD but stable biapical pleuroparenchymal scarring. - Outpatient follow-up with pulmonology  Essential hypertension: BP slightly elevated - Continue home losartan  and Cardizem  CD  Lactic acidosis: Doubt sepsis.  Hemodynamically stable. - Discontinue Lasix  - NS bolus 500 cc followed by 75 cc an hour - Recheck.  Generalized weakness: Likely due to acute illness.  Independently ambulates at baseline - PT/OT eval  Mild cognitive impairment?  Seems to be on Aricept .  She is awake and alert and oriented x 4. - Continue home Aricept  - Reorientation and delirium precaution  PVD/hyperlipidemia - Continue Lipitor aspirin   Hypothyroidism - Continue Synthroid   GERD - Continue PPI  History of tobacco use: Quit smoking in 2005.  Body mass index is 26.49 kg/m.          DVT prophylaxis:  enoxaparin  (LOVENOX ) injection 40 mg Start: 11/23/24 2200  Code Status: Full code Family Communication: None at bedside Level of care: Telemetry Status is: Inpatient Remains inpatient appropriate because: Influenza A infection, COPD exacerbation and lactic acidosis   Final disposition: Home   55 minutes with more than 50% spent in reviewing records, counseling patient/family and coordinating care.  Consultants:  None  Procedures: None  Microbiology summarized: Influenza A PCR positive Influenza B, COVID-19 and RSV P nonreactive Blood cultures NGTD  Objective: Vitals:   11/23/24 2135 11/24/24 0022 11/24/24 0513 11/24/24 0754  BP: (!) 85/69 (!) 136/52 (!) 150/67  Pulse: 66 70 69 75  Resp: 18  18   Temp: 97.9 F (36.6 C)  97.9 F (36.6 C)    TempSrc:      SpO2: 95%  99% 96%  Weight:      Height:        Examination:  GENERAL: No apparent distress.  Nontoxic. HEENT: MMM.  Vision and hearing grossly intact.  NECK: Supple.  No apparent JVD.  RESP:  No IWOB.  Fair aeration bilaterally. CVS:  RRR. Heart sounds normal.  ABD/GI/GU: BS+. Abd soft, NTND.  MSK/EXT:  Moves extremities. No apparent deformity. No edema.  SKIN: Small bruise over LLE NEURO: AA.  Oriented appropriately.  No apparent focal neuro deficit. PSYCH: Calm. Normal affect.   Sch Meds:  Scheduled Meds:  aspirin  EC  81 mg Oral Daily   atorvastatin   40 mg Oral QHS   diltiazem   120 mg Oral Daily   donepezil   10 mg Oral QHS   doxycycline   100 mg Oral Q12H   enoxaparin  (LOVENOX ) injection  40 mg Subcutaneous Q24H   furosemide   20 mg Oral Daily   ipratropium-albuterol   3 mL Nebulization BID   levothyroxine   112 mcg Oral Daily   losartan   25 mg Oral Daily   mirtazapine   15 mg Oral QHS   montelukast   10 mg Oral QHS   oseltamivir   30 mg Oral Daily   pantoprazole   40 mg Oral QAC breakfast   predniSONE   40 mg Oral Q breakfast   Continuous Infusions:  cefTRIAXone  (ROCEPHIN )  IV 1 g (11/23/24 1439)   PRN Meds:.acetaminophen  **OR** acetaminophen , albuterol , ALPRAZolam , ondansetron  **OR** ondansetron  (ZOFRAN ) IV  Antimicrobials: Anti-infectives (From admission, onward)    Start     Dose/Rate Route Frequency Ordered Stop   11/23/24 2200  doxycycline  (VIBRA -TABS) tablet 100 mg        100 mg Oral Every 12 hours 11/23/24 1430     11/23/24 1900  oseltamivir  (TAMIFLU ) capsule 30 mg        30 mg Oral Daily 11/23/24 1805 11/28/24 0959   11/23/24 1415  cefTRIAXone  (ROCEPHIN ) 1 g in sodium chloride  0.9 % 100 mL IVPB        1 g 200 mL/hr over 30 Minutes Intravenous Every 24 hours 11/23/24 1412     11/23/24 1415  doxycycline  (VIBRA -TABS) tablet 100 mg        100 mg Oral  Once 11/23/24 1412 11/23/24 1439        I have personally reviewed the following labs and  images: CBC: Recent Labs  Lab 11/23/24 1219  WBC 14.6*  HGB 13.5  HCT 43.6  MCV 90.6  PLT 151   BMP &GFR Recent Labs  Lab 11/23/24 1219  NA 136  K 3.7  CL 97*  CO2 24  GLUCOSE 108*  BUN 21  CREATININE 1.21*  CALCIUM  9.0   Estimated Creatinine Clearance: 29.7 mL/min (A) (by C-G formula based on SCr of 1.21 mg/dL (H)). Liver & Pancreas: No results for input(s): AST, ALT, ALKPHOS, BILITOT, PROT, ALBUMIN in the last 168 hours. No results for input(s): LIPASE, AMYLASE in the last 168 hours. No results for input(s): AMMONIA in the last 168 hours. Diabetic: No results for input(s): HGBA1C in the last 72 hours. No results for input(s): GLUCAP in the last 168 hours. Cardiac Enzymes: No results for input(s): CKTOTAL, CKMB, CKMBINDEX, TROPONINI in the last 168 hours. No results for input(s): PROBNP in the last 8760 hours. Coagulation Profile: No results for  input(s): INR, PROTIME in the last 168 hours. Thyroid  Function Tests: No results for input(s): TSH, T4TOTAL, FREET4, T3FREE, THYROIDAB in the last 72 hours. Lipid Profile: No results for input(s): CHOL, HDL, LDLCALC, TRIG, CHOLHDL, LDLDIRECT in the last 72 hours. Anemia Panel: No results for input(s): VITAMINB12, FOLATE, FERRITIN, TIBC, IRON, RETICCTPCT in the last 72 hours. Urine analysis:    Component Value Date/Time   COLORURINE YELLOW 04/27/2020 1958   APPEARANCEUR CLEAR 04/27/2020 1958   LABSPEC 1.011 04/27/2020 1958   PHURINE 6.0 04/27/2020 1958   GLUCOSEU NEGATIVE 04/27/2020 1958   HGBUR NEGATIVE 04/27/2020 1958   BILIRUBINUR NEGATIVE 04/27/2020 1958   KETONESUR NEGATIVE 04/27/2020 1958   PROTEINUR NEGATIVE 04/27/2020 1958   UROBILINOGEN 0.2 11/03/2011 1220   NITRITE NEGATIVE 04/27/2020 1958   LEUKOCYTESUR MODERATE (A) 04/27/2020 1958   Sepsis Labs: Invalid input(s): PROCALCITONIN, LACTICIDVEN  Microbiology: Recent Results (from the  past 240 hours)  Resp panel by RT-PCR (RSV, Flu A&B, Covid) Anterior Nasal Swab     Status: Abnormal   Collection Time: 11/23/24  1:31 PM   Specimen: Anterior Nasal Swab  Result Value Ref Range Status   SARS Coronavirus 2 by RT PCR NEGATIVE NEGATIVE Final    Comment: (NOTE) SARS-CoV-2 target nucleic acids are NOT DETECTED.  The SARS-CoV-2 RNA is generally detectable in upper respiratory specimens during the acute phase of infection. The lowest concentration of SARS-CoV-2 viral copies this assay can detect is 138 copies/mL. A negative result does not preclude SARS-Cov-2 infection and should not be used as the sole basis for treatment or other patient management decisions. A negative result may occur with  improper specimen collection/handling, submission of specimen other than nasopharyngeal swab, presence of viral mutation(s) within the areas targeted by this assay, and inadequate number of viral copies(<138 copies/mL). A negative result must be combined with clinical observations, patient history, and epidemiological information. The expected result is Negative.  Fact Sheet for Patients:  bloggercourse.com  Fact Sheet for Healthcare Providers:  seriousbroker.it  This test is no t yet approved or cleared by the [place]  FDA and  has been authorized for detection and/or diagnosis of SARS-CoV-2 by FDA under an Emergency Use Authorization (EUA). This EUA will remain  in effect (meaning this test can be used) for the duration of the COVID-19 declaration under Section 564(b)(1) of the Act, 21 U.S.C.section 360bbb-3(b)(1), unless the authorization is terminated  or revoked sooner.       Influenza A by PCR POSITIVE (A) NEGATIVE Final   Influenza B by PCR NEGATIVE NEGATIVE Final    Comment: (NOTE) The Xpert Xpress SARS-CoV-2/FLU/RSV plus assay is intended as an aid in the diagnosis of influenza from Nasopharyngeal swab specimens  and should not be used as a sole basis for treatment. Nasal washings and aspirates are unacceptable for Xpert Xpress SARS-CoV-2/FLU/RSV testing.  Fact Sheet for Patients: bloggercourse.com  Fact Sheet for Healthcare Providers: seriousbroker.it  This test is not yet approved or cleared by the [place]  FDA and has been authorized for detection and/or diagnosis of SARS-CoV-2 by FDA under an Emergency Use Authorization (EUA). This EUA will remain in effect (meaning this test can be used) for the duration of the COVID-19 declaration under Section 564(b)(1) of the Act, 21 U.S.C. section 360bbb-3(b)(1), unless the authorization is terminated or revoked.     Resp Syncytial Virus by PCR NEGATIVE NEGATIVE Final    Comment: (NOTE) Fact Sheet for Patients: bloggercourse.com  Fact Sheet for Healthcare Providers: seriousbroker.it  This test is  not yet approved or cleared by the [place]  FDA and has been authorized for detection and/or diagnosis of SARS-CoV-2 by FDA under an Emergency Use Authorization (EUA). This EUA will remain in effect (meaning this test can be used) for the duration of the COVID-19 declaration under Section 564(b)(1) of the Act, 21 U.S.C. section 360bbb-3(b)(1), unless the authorization is terminated or revoked.  Performed at [facility], [address] [place], [place] [address]   Culture, blood (Routine X 2) w Reflex to ID Panel     Status: None (Preliminary result)   Collection Time: 11/23/24  2:33 PM   Specimen: BLOOD RIGHT HAND  Result Value Ref Range Status   Specimen Description   Final    BLOOD RIGHT HAND Performed at [facility], [address] [place], [place] [address]    Special Requests   Final    BOTTLES DRAWN AEROBIC AND ANAEROBIC Blood Culture results may not be optimal due to an inadequate volume of blood  received in culture bottles Performed at [facility], [address] [place], [place] [address]    Culture PENDING  Incomplete   Report Status PENDING  Incomplete  Culture, blood (Routine X 2) w Reflex to ID Panel     Status: None (Preliminary result)   Collection Time: 11/23/24  7:19 PM   Specimen: BLOOD RIGHT HAND  Result Value Ref Range Status   Specimen Description   Final    BLOOD RIGHT HAND Performed at [facility], [address] [place], [place] [address]    Special Requests   Final    BOTTLES DRAWN AEROBIC ONLY Blood Culture adequate volume Performed at [facility], [address] [place], [place] [address]    Culture PENDING  Incomplete   Report Status PENDING  Incomplete    Radiology Studies: DG Chest 2 View Result Date: 11/23/2024 CLINICAL DATA:  Shortness of breath and body aches. EXAM: CHEST - 2 VIEW COMPARISON:  July 07, 2022 FINDINGS: The heart size and mediastinal contours are within normal limits. There is marked severity calcification of the thoracic aorta. No acute infiltrate, pleural effusion or pneumothorax is identified. A stable, subcentimeter likely benign nodular opacity is seen overlying the medial aspect of the left lung base. The visualized skeletal structures are unremarkable. IMPRESSION: No active cardiopulmonary disease. Electronically Signed   By: Suzen Dials M.D.   On: 11/23/2024 13:05      Milady Fleener T. Saleha Kalp [facility]  If 7PM-7AM, please contact night-coverage [email or web address] 11/24/2024, 8:15 AM   "

## 2024-11-24 NOTE — Progress Notes (Signed)
" °   11/24/24 1557  TOC Brief Assessment  Insurance and Status Reviewed  Patient has primary care physician Yes Mikey, Darice CROME, MD)  Home environment has been reviewed from home  Prior level of function: independent  Prior/Current Home Services No current home services  Social Drivers of Health Review SDOH reviewed no interventions necessary  Readmission risk has been reviewed Yes  Transition of care needs no transition of care needs at this time    "

## 2024-11-24 NOTE — Progress Notes (Signed)
 PHARMACY - PHYSICIAN COMMUNICATION CRITICAL VALUE ALERT - BLOOD CULTURE IDENTIFICATION (BCID)  Sharon Mcdaniel is an 89 y.o. female who presented to [facility] on 11/23/2024 with a chief complaint of shortness of breath, +flu  Assessment:  1 of 3 blood culture bottles growing staph species - presumed contaminant  Name of physician (or Provider) ContactedBETHA Bump  Current antibiotics: Rocephin  1g q24   Changes to prescribed antibiotics recommended:  No changes  Results for orders placed or performed during the hospital encounter of 11/23/24  Blood Culture ID Panel (Reflexed) (Collected: 11/23/2024  2:33 PM)  Result Value Ref Range   Enterococcus faecalis NOT DETECTED NOT DETECTED   Enterococcus Faecium NOT DETECTED NOT DETECTED   Listeria monocytogenes NOT DETECTED NOT DETECTED   Staphylococcus species DETECTED (A) NOT DETECTED   Staphylococcus aureus (BCID) NOT DETECTED NOT DETECTED   Staphylococcus epidermidis NOT DETECTED NOT DETECTED   Staphylococcus lugdunensis NOT DETECTED NOT DETECTED   Streptococcus species NOT DETECTED NOT DETECTED   Streptococcus agalactiae NOT DETECTED NOT DETECTED   Streptococcus pneumoniae NOT DETECTED NOT DETECTED   Streptococcus pyogenes NOT DETECTED NOT DETECTED   A.calcoaceticus-baumannii NOT DETECTED NOT DETECTED   Bacteroides fragilis NOT DETECTED NOT DETECTED   Enterobacterales NOT DETECTED NOT DETECTED   Enterobacter cloacae complex NOT DETECTED NOT DETECTED   Escherichia coli NOT DETECTED NOT DETECTED   Klebsiella aerogenes NOT DETECTED NOT DETECTED   Klebsiella oxytoca NOT DETECTED NOT DETECTED   Klebsiella pneumoniae NOT DETECTED NOT DETECTED   Proteus species NOT DETECTED NOT DETECTED   Salmonella species NOT DETECTED NOT DETECTED   Serratia marcescens NOT DETECTED NOT DETECTED   Haemophilus influenzae NOT DETECTED NOT DETECTED   Neisseria meningitidis NOT DETECTED NOT DETECTED   Pseudomonas aeruginosa NOT DETECTED NOT DETECTED    Stenotrophomonas maltophilia NOT DETECTED NOT DETECTED   Candida albicans NOT DETECTED NOT DETECTED   Candida auris NOT DETECTED NOT DETECTED   Candida glabrata NOT DETECTED NOT DETECTED   Candida krusei NOT DETECTED NOT DETECTED   Candida parapsilosis NOT DETECTED NOT DETECTED   Candida tropicalis NOT DETECTED NOT DETECTED   Cryptococcus neoformans/gattii NOT DETECTED NOT DETECTED    Britta Eva Na 11/24/2024  2:50 PM

## 2024-11-24 NOTE — Progress Notes (Signed)
 PHARMACY NOTE:  ANTIMICROBIAL RENAL DOSAGE ADJUSTMENT  Current antimicrobial regimen includes a mismatch between antimicrobial dosage and estimated renal function.  As per policy approved by the Pharmacy & Therapeutics and Medical Executive Committees, the antimicrobial dosage will be adjusted accordingly.  Current antimicrobial dosage:  Tamiflu  30mg  daily  Indication: fluA(+)  Renal Function:  Estimated Creatinine Clearance: 34.5 mL/min (A) (by C-G formula based on SCr of 1.04 mg/dL (H)). []      On intermittent HD, scheduled: []      On CRRT    Antimicrobial dosage has been changed to:  Tamiflu  30mg  BID  Additional comments: N/A   Thank you for allowing pharmacy to be a part of this patient's care.  Lacinda Moats, PharmD Clinical Pharmacist  1/23/202611:02 AM

## 2024-11-24 NOTE — Plan of Care (Signed)
" °  Problem: Activity: Goal: Risk for activity intolerance will decrease Outcome: Progressing   Problem: Nutrition: Goal: Adequate nutrition will be maintained Outcome: Progressing   Problem: Coping: Goal: Level of anxiety will decrease Outcome: Progressing   Problem: Pain Managment: Goal: General experience of comfort will improve and/or be controlled Outcome: Progressing   Problem: Activity: Goal: Ability to tolerate increased activity will improve Outcome: Progressing   Problem: Respiratory: Goal: Ability to maintain a clear airway will improve Outcome: Progressing   "

## 2024-11-24 NOTE — Plan of Care (Signed)

## 2024-11-25 ENCOUNTER — Inpatient Hospital Stay (HOSPITAL_COMMUNITY)

## 2024-11-25 DIAGNOSIS — J441 Chronic obstructive pulmonary disease with (acute) exacerbation: Secondary | ICD-10-CM | POA: Diagnosis not present

## 2024-11-25 DIAGNOSIS — J9601 Acute respiratory failure with hypoxia: Secondary | ICD-10-CM | POA: Diagnosis not present

## 2024-11-25 DIAGNOSIS — K219 Gastro-esophageal reflux disease without esophagitis: Secondary | ICD-10-CM | POA: Diagnosis not present

## 2024-11-25 DIAGNOSIS — I10 Essential (primary) hypertension: Secondary | ICD-10-CM | POA: Diagnosis not present

## 2024-11-25 DIAGNOSIS — R008 Other abnormalities of heart beat: Secondary | ICD-10-CM | POA: Diagnosis not present

## 2024-11-25 LAB — CBC
HCT: 38.5 % (ref 36.0–46.0)
Hemoglobin: 12 g/dL (ref 12.0–15.0)
MCH: 28.1 pg (ref 26.0–34.0)
MCHC: 31.2 g/dL (ref 30.0–36.0)
MCV: 90.2 fL (ref 80.0–100.0)
Platelets: 130 10*3/uL — ABNORMAL LOW (ref 150–400)
RBC: 4.27 MIL/uL (ref 3.87–5.11)
RDW: 14.7 % (ref 11.5–15.5)
WBC: 7.3 10*3/uL (ref 4.0–10.5)
nRBC: 0 % (ref 0.0–0.2)

## 2024-11-25 LAB — RENAL FUNCTION PANEL
Albumin: 3.3 g/dL — ABNORMAL LOW (ref 3.5–5.0)
Anion gap: 12 (ref 5–15)
BUN: 22 mg/dL (ref 8–23)
CO2: 23 mmol/L (ref 22–32)
Calcium: 8.6 mg/dL — ABNORMAL LOW (ref 8.9–10.3)
Chloride: 104 mmol/L (ref 98–111)
Creatinine, Ser: 0.96 mg/dL (ref 0.44–1.00)
GFR, Estimated: 58 mL/min — ABNORMAL LOW
Glucose, Bld: 86 mg/dL (ref 70–99)
Phosphorus: 3.4 mg/dL (ref 2.5–4.6)
Potassium: 3.5 mmol/L (ref 3.5–5.1)
Sodium: 139 mmol/L (ref 135–145)

## 2024-11-25 LAB — ECHOCARDIOGRAM COMPLETE
AR max vel: 2.7 cm2
AV Area VTI: 2.85 cm2
AV Area mean vel: 2.19 cm2
AV Mean grad: 5 mmHg
AV Peak grad: 8.9 mmHg
Ao pk vel: 1.49 m/s
Area-P 1/2: 1.9 cm2
Calc EF: 64 %
Height: 62 in
S' Lateral: 2.3 cm
Single Plane A2C EF: 60.9 %
Single Plane A4C EF: 64.3 %
Weight: 2317.48 [oz_av]

## 2024-11-25 LAB — MISC LABCORP TEST (SEND OUT): Labcorp test code: 83935

## 2024-11-25 LAB — MAGNESIUM: Magnesium: 2.1 mg/dL (ref 1.7–2.4)

## 2024-11-25 LAB — LACTIC ACID, PLASMA: Lactic Acid, Venous: 2.3 mmol/L (ref 0.5–1.9)

## 2024-11-25 MED ORDER — POTASSIUM CHLORIDE CRYS ER 20 MEQ PO TBCR
40.0000 meq | EXTENDED_RELEASE_TABLET | Freq: Once | ORAL | Status: AC
  Administered 2024-11-25: 40 meq via ORAL
  Filled 2024-11-25: qty 2

## 2024-11-25 MED ORDER — REVEFENACIN 175 MCG/3ML IN SOLN
175.0000 ug | Freq: Every day | RESPIRATORY_TRACT | Status: DC
  Administered 2024-11-25 – 2024-11-26 (×2): 175 ug via RESPIRATORY_TRACT
  Filled 2024-11-25 (×2): qty 3

## 2024-11-25 MED ORDER — BUDESONIDE 0.5 MG/2ML IN SUSP
0.5000 mg | Freq: Two times a day (BID) | RESPIRATORY_TRACT | Status: DC
  Administered 2024-11-25 – 2024-11-26 (×4): 0.5 mg via RESPIRATORY_TRACT
  Filled 2024-11-25 (×4): qty 2

## 2024-11-25 MED ORDER — SODIUM CHLORIDE 0.9 % IV SOLN
2.0000 g | Freq: Every day | INTRAVENOUS | Status: DC
  Administered 2024-11-25 – 2024-11-28 (×4): 2 g via INTRAVENOUS
  Filled 2024-11-25 (×4): qty 20

## 2024-11-25 MED ORDER — IPRATROPIUM-ALBUTEROL 0.5-2.5 (3) MG/3ML IN SOLN
3.0000 mL | RESPIRATORY_TRACT | Status: DC | PRN
  Administered 2024-11-25 – 2024-11-28 (×4): 3 mL via RESPIRATORY_TRACT
  Filled 2024-11-25 (×4): qty 3

## 2024-11-25 MED ORDER — ARFORMOTEROL TARTRATE 15 MCG/2ML IN NEBU
15.0000 ug | INHALATION_SOLUTION | Freq: Two times a day (BID) | RESPIRATORY_TRACT | Status: DC
  Administered 2024-11-25 – 2024-11-26 (×4): 15 ug via RESPIRATORY_TRACT
  Filled 2024-11-25 (×4): qty 2

## 2024-11-25 MED ORDER — HYDRALAZINE HCL 25 MG PO TABS
25.0000 mg | ORAL_TABLET | Freq: Four times a day (QID) | ORAL | Status: DC | PRN
  Administered 2024-11-26 (×2): 25 mg via ORAL
  Filled 2024-11-25 (×2): qty 1

## 2024-11-25 NOTE — Evaluation (Signed)
 Physical Therapy Evaluation Patient Details Name: Sharon Mcdaniel MRN: [id number] DOB: 04-15-1936 Today's Date: 11/25/2024  History of Present Illness  89 year old F with PMH of COPD/asthma, pulmonary fibrosis, allergic rhinitis, prior tobacco use that she quit in 2005, PVD, HLD, hypothyroidism and mild cognitive impairment on Aricept  brought to ED by EMS due to shortness of breath, productive cough, wheezing, sore throat, fatigue, malaise, fever and chills, and admitted with working diagnosis of acute respiratory failure with hypoxia due to COPD exacerbation in the setting of influenza A infection.  Clinical Impression  On eval, pt was CGA-Min A for mobility. She ambulated ~20 feet around the room with a RW. Pt presents with general weakness, decreased activity tolerance, and impaired gait/balance. O2 92% on RA during session with dyspnea and wheezing with activity. Daughter was present during session-reports pt is independent at baseline. Will plan to follow and progress activity as tolerated. Will recommend HHPT f/u.         If plan is discharge home, recommend the following: A little help with walking and/or transfers;A little help with bathing/dressing/bathroom;Assistance with cooking/housework;Assist for transportation;Help with stairs or ramp for entrance   Can travel by private vehicle        Equipment Recommendations None recommended by PT  Recommendations for Other Services       Functional Status Assessment Patient has had a recent decline in their functional status and demonstrates the ability to make significant improvements in function in a reasonable and predictable amount of time.     Precautions / Restrictions Precautions Precautions: Fall Precaution/Restrictions Comments: monitor O2 Restrictions Weight Bearing Restrictions Per Provider Order: No      Mobility  Bed Mobility Overal bed mobility: Needs Assistance Bed Mobility: Supine to Sit, Sit to Supine     Supine  to sit: HOB elevated, Min assist Sit to supine: Supervision, HOB elevated   General bed mobility comments: assist for trunk to upright. increased time and effort for pt.    Transfers Overall transfer level: Needs assistance Equipment used: Rolling walker (2 wheels) Transfers: Sit to/from Stand Sit to Stand: Contact guard assist           General transfer comment: Cues for safety, technique, hand placement. Assist to rise, steady, control descent. Increased time and effort for pt    Ambulation/Gait Ambulation/Gait assistance: Contact guard assist Gait Distance (Feet): 20 Feet Assistive device: Rolling walker (2 wheels) Gait Pattern/deviations: Step-through pattern, Decreased stride length       General Gait Details: CGA for safety. Slow gait speed. Pt fatigues fairly easily. O2 92% on RA.  Stairs            Wheelchair Mobility     Tilt Bed    Modified Rankin (Stroke Patients Only)       Balance Overall balance assessment: Needs assistance         Standing balance support: Bilateral upper extremity supported, Reliant on assistive device for balance, During functional activity Standing balance-Leahy Scale: Poor                               Pertinent Vitals/Pain Pain Assessment Pain Assessment: Faces Faces Pain Scale: Hurts a little bit Pain Location: generalized achiness Pain Descriptors / Indicators: Discomfort, Aching Pain Intervention(s): Limited activity within patient's tolerance, Monitored during session, Repositioned    Home Living Family/patient expects to be discharged to:: Private residence Living Arrangements: Children Available Help at Discharge: Family Type of  Home: House Home Access: Stairs to enter   [employer] of Steps: 2   Home Layout: One level Home Equipment: Agricultural Consultant (2 wheels);Wheelchair - Metallurgist Comments: lives with daughter    Prior Function Prior Level of  Function : Independent/Modified Independent                     Extremity/Trunk Assessment   Upper Extremity Assessment Upper Extremity Assessment: Defer to OT evaluation    Lower Extremity Assessment Lower Extremity Assessment: Generalized weakness    Cervical / Trunk Assessment Cervical / Trunk Assessment: Normal  Communication   Communication Communication: No apparent difficulties    Cognition Arousal: Alert Behavior During Therapy: WFL for tasks assessed/performed                             Following commands: Intact       Cueing Cueing Techniques: Verbal cues     General Comments      Exercises     Assessment/Plan    PT Assessment Patient needs continued PT services  PT Problem List Decreased strength;Decreased range of motion;Decreased activity tolerance;Decreased balance;Decreased mobility;Decreased knowledge of use of DME       PT Treatment Interventions Gait training;DME instruction;Functional mobility training;Therapeutic activities;Therapeutic exercise;Patient/family education;Balance training    PT Goals (Current goals can be found in the Care Plan section)  Acute Rehab PT Goals Patient Stated Goal: feel better. regain independence PT Goal Formulation: With patient Time For Goal Achievement: 12/09/24 Potential to Achieve Goals: Good    Frequency Min 3X/week     Co-evaluation               AM-PAC PT 6 Clicks Mobility  Outcome Measure Help needed turning from your back to your side while in a flat bed without using bedrails?: None Help needed moving from lying on your back to sitting on the side of a flat bed without using bedrails?: A Little Help needed moving to and from a bed to a chair (including a wheelchair)?: A Little Help needed standing up from a chair using your arms (e.g., wheelchair or bedside chair)?: A Little Help needed to walk in hospital room?: A Little Help needed climbing 3-5 steps with a  railing? : A Little 6 Click Score: 19    End of Session   Activity Tolerance: Patient tolerated treatment well;Patient limited by fatigue Patient left: in bed;with call bell/phone within reach;with family/visitor present   PT Visit Diagnosis: Muscle weakness (generalized) (M62.81);Difficulty in walking, not elsewhere classified (R26.2);Unsteadiness on feet (R26.81)    Time: 1500-1508 PT Time Calculation (min) (ACUTE ONLY): 8 min   Charges:   PT Evaluation $PT Eval Low Complexity: 1 Low   PT General Charges $$ ACUTE PT VISIT: 1 Visit            Dannial SQUIBB, PT Acute Rehabilitation  Office: [phone]

## 2024-11-25 NOTE — Evaluation (Signed)
 Occupational Therapy Evaluation Patient Details Name: Sharon Mcdaniel MRN: [id number] DOB: January 05, 1936 Today's Date: 11/25/2024   History of Present Illness   89 year old F with PMH of COPD/asthma, pulmonary fibrosis, allergic rhinitis, prior tobacco use that she quit in 2005, PVD, HLD, hypothyroidism and mild cognitive impairment on Aricept  brought to ED by EMS due to shortness of breath, productive cough, wheezing, sore throat, fatigue, malaise, fever and chills, and admitted with working diagnosis of acute respiratory failure with hypoxia due to COPD exacerbation in the setting of influenza A infection.     Clinical Impressions Pt admitted with the above diagnoses and presents with below problem list. Pt will benefit from continued acute OT to address the below listed deficits and maximize independence with basic ADLs prior to d/c. At baseline, pt is independent with ADLs, lives with her daughter. Pt currently needs up to contact guard assist and rw for LB ADLs and functional mobility/transfers. Pt on RA throughout session. SpO2 while walking in room was 98.      If plan is discharge home, recommend the following:   Help with stairs or ramp for entrance;A little help with walking and/or transfers;A little help with bathing/dressing/bathroom     Functional Status Assessment   Patient has had a recent decline in their functional status and demonstrates the ability to make significant improvements in function in a reasonable and predictable amount of time.     Equipment Recommendations   None recommended by OT     Recommendations for Other Services         Precautions/Restrictions   Restrictions Weight Bearing Restrictions Per Provider Order: No     Mobility Bed Mobility Overal bed mobility: Modified Independent             General bed mobility comments: extra time/effort    Transfers Overall transfer level: Needs assistance Equipment used: Rolling walker (2  wheels) Transfers: Sit to/from Stand Sit to Stand: Supervision           General transfer comment: to/from EOB and recliner      Balance Overall balance assessment: Mild deficits observed, not formally tested                                         ADL either performed or assessed with clinical judgement   ADL Overall ADL's : Needs assistance/impaired Eating/Feeding: Independent   Grooming: Supervision/safety;Standing   Upper Body Bathing: Set up;Sitting   Lower Body Bathing: Supervison/ safety;Sit to/from stand   Upper Body Dressing : Set up;Sitting   Lower Body Dressing: Supervision/safety;Sit to/from stand   Toilet Transfer: Supervision/safety;Ambulation;Rolling walker (2 wheels)   Toileting- Clothing Manipulation and Hygiene: Supervision/safety;Contact guard assist   Tub/ Shower Transfer: Supervision/safety;Contact guard assist   Functional mobility during ADLs: Supervision/safety;Rolling walker (2 wheels) General ADL Comments: Pt requests using rw for moblity in the room     Vision         Perception         Praxis         Pertinent Vitals/Pain Pain Assessment Pain Assessment: Faces Faces Pain Scale: Hurts a little bit Pain Location: generalized achiness Pain Descriptors / Indicators: Aching Pain Intervention(s): Monitored during session     Extremity/Trunk Assessment Upper Extremity Assessment Upper Extremity Assessment: Generalized weakness;Overall [facility] for tasks assessed   Lower Extremity Assessment Lower Extremity Assessment: Defer to PT evaluation  Communication Communication Communication: No apparent difficulties   Cognition Arousal: Alert Behavior During Therapy: WFL for tasks assessed/performed Cognition: No apparent impairments                               Following commands: Intact       Cueing  General Comments   Cueing Techniques: Verbal cues      Exercises     Shoulder  Instructions      Home Living Family/patient expects to be discharged to:: Private residence Living Arrangements: Children Available Help at Discharge: Family Type of Home: House Home Access: Stairs to enter [employer] of Steps: 2   Home Layout: One level               Home Equipment: Agricultural Consultant (2 wheels);Wheelchair - Training And Development Officer Comments: lives with daughter      Prior Functioning/Environment Prior Level of Function : Independent/Modified Independent                    OT Problem List: Decreased strength;Decreased activity tolerance;Impaired balance (sitting and/or standing);Decreased knowledge of use of DME or AE;Decreased knowledge of precautions   OT Treatment/Interventions: Self-care/ADL training;Energy conservation;DME and/or AE instruction;Therapeutic activities;Patient/family education;Balance training      OT Goals(Current goals can be found in the care plan section)   Acute Rehab OT Goals Patient Stated Goal: feel better OT Goal Formulation: With patient Time For Goal Achievement: 12/09/24 Potential to Achieve Goals: Good ADL Goals Pt Will Perform Lower Body Bathing: with modified independence;sit to/from stand Pt Will Perform Lower Body Dressing: with modified independence;sit to/from stand Pt Will Transfer to Toilet: with modified independence;ambulating Pt Will Perform Toileting - Clothing Manipulation and hygiene: with modified independence;sit to/from stand Pt Will Perform Tub/Shower Transfer: with modified independence;rolling walker;ambulating;shower seat   OT Frequency:  Min 2X/week    Co-evaluation              AM-PAC OT 6 Clicks Daily Activity     Outcome Measure Help from another person eating meals?: None Help from another person taking care of personal grooming?: None Help from another person toileting, which includes using toliet, bedpan, or urinal?: A Little Help from another  person bathing (including washing, rinsing, drying)?: A Little Help from another person to put on and taking off regular upper body clothing?: None Help from another person to put on and taking off regular lower body clothing?: A Little 6 Click Score: 21   End of Session Equipment Utilized During Treatment: Rolling walker (2 wheels)  Activity Tolerance: Patient tolerated treatment well Patient left: in chair;with call bell/phone within reach  OT Visit Diagnosis: Unsteadiness on feet (R26.81);Muscle weakness (generalized) (M62.81)                Time: 8868-8855 OT Time Calculation (min): 13 min Charges:  OT General Charges $OT Visit: 1 Visit OT Evaluation $OT Eval Low Complexity: 1 Low  Lamarr Hoots, OT Acute Rehabilitation Services Office: [phone]   Hoots Lamarr DEL 11/25/2024, 12:00 PM

## 2024-11-25 NOTE — Plan of Care (Signed)
   Problem: Education: Goal: Knowledge of General Education information will improve Description: Including pain rating scale, medication(s)/side effects and non-pharmacologic comfort measures Outcome: Progressing   Problem: Activity: Goal: Risk for activity intolerance will decrease Outcome: Progressing   Problem: Nutrition: Goal: Adequate nutrition will be maintained Outcome: Progressing   Problem: Coping: Goal: Level of anxiety will decrease Outcome: Progressing

## 2024-11-25 NOTE — Progress Notes (Signed)
 " PROGRESS NOTE  Sharon Mcdaniel FMW:[id number] DOB: 10-22-1936   PCP: Burney Darice LITTIE, MD  Patient is from: Home.  Lives with daughter.  Independently ambulates at baseline.  DOA: 11/23/2024 LOS: 2  Chief complaints Chief Complaint  Patient presents with   Shortness of Breath     Brief Narrative / Interim history: 89 year old F with PMH of COPD/asthma, pulmonary fibrosis, allergic rhinitis, prior tobacco use that she quit in 2005, PVD, HLD, hypothyroidism and mild cognitive impairment on Aricept  brought to ED by EMS due to shortness of breath, productive cough, wheezing, sore throat, fatigue, malaise, fever and chills, and admitted with working diagnosis of acute respiratory failure with hypoxia due to COPD exacerbation in the setting of influenza A infection.  Patient was started on prednisone  and doxycycline  the day prior to presentation without significant relief.  Patient received IV Solu-Medrol  and DuoNebs en route to ED  In ED, briefly tachypneic to 30s.  No oxygen requirement.  Slightly elevated BP.  WBC 14.6. Cr 1.21.  Otherwise, BMP and CBC without significant finding.  Lactic acid 2.7.  Influenza A PCR positive.  Influenza B, COVID-19 and RSV nonreactive.  CXR without acute finding.  Blood cultures drawn.  Patient was started on Tamiflu , nebulizers, ceftriaxone  and doxycycline  and admitted for further care.  Slowly improving.   Subjective: Seen and examined earlier this morning.  No major events overnight or this morning.  Continues to endorse shortness of breath, DOE, productive cough with yellowish phlegm.  Also feels weak.  Denies chest pain, GI or UTI symptoms.  Daughter available over the phone during this encounter and allowed to ask questions   Assessment and plan: Influenza A infection COPD with acute exacerbation due to influenza A infection Acute respiratory failure ruled out-no documented desaturation. -CXR without acute finding.  No oxygen requirement. -Blood  cultures NGTD.  Has lactic acidosis but not toxic. -Still with SOB, productive cough with yellowish phlegm, rhonchi and weakness. -Continue prednisone  40 mg daily.  Continue Tamiflu  -Continue ceftriaxone  and doxycycline  given productive cough.  Increase ceftriaxone  to 2 g. -Add Brovana , Pulmicort  and Yupelri .  DuoNebs as needed -Mucolytic's, antitussive , incentive spirometry, flutter valve, OOB, ambulatory sat, PT and OT -Continue PPI for GI prophylaxis -OOB, ambulatory saturation, PT/OT  History of postinflammatory pulmonary fibrosis? High-resolution CT in 2017 with no evidence of ILD but stable biapical pleuroparenchymal scarring. - Outpatient follow-up with pulmonology  Uncontrolled hypertension with wide pulse pressure: SBP 170s and DBP in 40s.  TTE in 2023 with mild aortic insufficiency. -Check echocardiogram -Continue home losartan  and Cardizem  CD -P.o. hydralazine  as needed.  Lactic acidosis: Doubt sepsis.  Hemodynamically stable.  Resolving. - Will recheck in the morning - Continue holding Lasix   Positive blood culture: GPC and GPR in 1 out of 2 anaerobic bottles likely contaminant.  Generalized weakness: Likely due to acute illness.  Independently ambulates at baseline - PT/OT eval  Mild cognitive impairment?  Seems to be on Aricept .  She is awake and alert and oriented x 4. - Continue home Aricept  - Reorientation and delirium precaution  PVD/hyperlipidemia - Continue Lipitor aspirin   Hypothyroidism - Continue Synthroid   GERD - Continue PPI  History of tobacco use: Quit smoking in 2005.  Body mass index is 26.49 kg/m.          DVT prophylaxis:  enoxaparin  (LOVENOX ) injection 40 mg Start: 11/23/24 2200  Code Status: Full code Family Communication: Updated patient's daughter over the phone. Level of care: Med-Surg Status is: Inpatient Remains inpatient  appropriate because: Influenza A infection, COPD exacerbation and lactic acidosis   Final  disposition: Home   55 minutes with more than 50% spent in reviewing records, counseling patient/family and coordinating care.  Consultants:  None  Procedures: None  Microbiology summarized: Influenza A PCR positive Influenza B, COVID-19 and RSV P nonreactive Blood cultures with GPC and GPR in 1 out of 4 bottles.  Objective: Vitals:   11/25/24 0646 11/25/24 0929 11/25/24 1008 11/25/24 1222  BP: (!) 170/46 (!) 171/48    Pulse: 60 65    Resp: 18 16    Temp: (!) 97.5 F (36.4 C) 97.7 F (36.5 C)    TempSrc: Oral     SpO2: 99% 100% 96% 94%  Weight:      Height:        Examination:  GENERAL: No apparent distress.  Nontoxic. HEENT: MMM.  Vision and hearing grossly intact.  NECK: Supple.  No apparent JVD.  RESP:  No IWOB.  Fair aeration bilaterally.  Rhonchi on exam. CVS:  RRR. Heart sounds normal.  ABD/GI/GU: BS+. Abd soft, NTND.  MSK/EXT:  Moves extremities. No apparent deformity. No edema.  SKIN: Small bruise over LLE NEURO: AA.  Oriented appropriately.  No apparent focal neuro deficit. PSYCH: Calm. Normal affect.   Sch Meds:  Scheduled Meds:  arformoterol   15 mcg Nebulization BID   aspirin  EC  81 mg Oral Daily   atorvastatin   40 mg Oral QHS   budesonide  (PULMICORT ) nebulizer solution  0.5 mg Nebulization BID   diltiazem   120 mg Oral Daily   donepezil   10 mg Oral QHS   doxycycline   100 mg Oral Q12H   enoxaparin  (LOVENOX ) injection  40 mg Subcutaneous Q24H   levothyroxine   112 mcg Oral Daily   losartan   25 mg Oral Daily   mirtazapine   15 mg Oral QHS   montelukast   10 mg Oral QHS   oseltamivir   30 mg Oral BID   pantoprazole   40 mg Oral QAC breakfast   predniSONE   40 mg Oral Q breakfast   revefenacin   175 mcg Nebulization Daily   Continuous Infusions:  cefTRIAXone  (ROCEPHIN )  IV     PRN Meds:.acetaminophen  **OR** acetaminophen , ALPRAZolam , ipratropium-albuterol , ondansetron  **OR** ondansetron  (ZOFRAN ) IV  Antimicrobials: Anti-infectives (From admission,  onward)    Start     Dose/Rate Route Frequency Ordered Stop   11/25/24 1400  cefTRIAXone  (ROCEPHIN ) 2 g in sodium chloride  0.9 % 100 mL IVPB        2 g 200 mL/hr over 30 Minutes Intravenous Daily 11/25/24 0915     11/24/24 2200  oseltamivir  (TAMIFLU ) capsule 30 mg        30 mg Oral 2 times daily 11/24/24 1105 11/28/24 0959   11/23/24 2200  doxycycline  (VIBRA -TABS) tablet 100 mg        100 mg Oral Every 12 hours 11/23/24 1430     11/23/24 1900  oseltamivir  (TAMIFLU ) capsule 30 mg  Status:  Discontinued        30 mg Oral Daily 11/23/24 1805 11/24/24 1105   11/23/24 1415  cefTRIAXone  (ROCEPHIN ) 1 g in sodium chloride  0.9 % 100 mL IVPB  Status:  Discontinued        1 g 200 mL/hr over 30 Minutes Intravenous Every 24 hours 11/23/24 1412 11/25/24 0915   11/23/24 1415  doxycycline  (VIBRA -TABS) tablet 100 mg        100 mg Oral  Once 11/23/24 1412 11/23/24 1439  I have personally reviewed the following labs and images: CBC: Recent Labs  Lab 11/23/24 1219 11/24/24 0835 11/25/24 0546  WBC 14.6* 8.7 7.3  HGB 13.5 12.7 12.0  HCT 43.6 39.9 38.5  MCV 90.6 88.1 90.2  PLT 151 154 130*   BMP &GFR Recent Labs  Lab 11/23/24 1219 11/24/24 0835 11/25/24 0546  NA 136 137 139  K 3.7 3.6 3.5  CL 97* 99 104  CO2 24 24 23   GLUCOSE 108* 105* 86  BUN 21 23 22   CREATININE 1.21* 1.04* 0.96  CALCIUM  9.0 9.0 8.6*  MG  --   --  2.1  PHOS  --   --  3.4   Estimated Creatinine Clearance: 37.4 mL/min (by C-G formula based on SCr of 0.96 mg/dL). Liver & Pancreas: Recent Labs  Lab 11/24/24 0835 11/25/24 0546  AST 26  --   ALT 15  --   ALKPHOS 89  --   BILITOT 0.2  --   PROT 6.4*  --   ALBUMIN 3.7 3.3*   No results for input(s): LIPASE, AMYLASE in the last 168 hours. No results for input(s): AMMONIA in the last 168 hours. Diabetic: No results for input(s): HGBA1C in the last 72 hours. No results for input(s): GLUCAP in the last 168 hours. Cardiac Enzymes: No results for  input(s): CKTOTAL, CKMB, CKMBINDEX, TROPONINI in the last 168 hours. No results for input(s): PROBNP in the last 8760 hours. Coagulation Profile: No results for input(s): INR, PROTIME in the last 168 hours. Thyroid  Function Tests: No results for input(s): TSH, T4TOTAL, FREET4, T3FREE, THYROIDAB in the last 72 hours. Lipid Profile: No results for input(s): CHOL, HDL, LDLCALC, TRIG, CHOLHDL, LDLDIRECT in the last 72 hours. Anemia Panel: No results for input(s): VITAMINB12, FOLATE, FERRITIN, TIBC, IRON, RETICCTPCT in the last 72 hours. Urine analysis:    Component Value Date/Time   COLORURINE YELLOW 04/27/2020 1958   APPEARANCEUR CLEAR 04/27/2020 1958   LABSPEC 1.011 04/27/2020 1958   PHURINE 6.0 04/27/2020 1958   GLUCOSEU NEGATIVE 04/27/2020 1958   HGBUR NEGATIVE 04/27/2020 1958   BILIRUBINUR NEGATIVE 04/27/2020 1958   KETONESUR NEGATIVE 04/27/2020 1958   PROTEINUR NEGATIVE 04/27/2020 1958   UROBILINOGEN 0.2 11/03/2011 1220   NITRITE NEGATIVE 04/27/2020 1958   LEUKOCYTESUR MODERATE (A) 04/27/2020 1958   Sepsis Labs: Invalid input(s): PROCALCITONIN, LACTICIDVEN  Microbiology: Recent Results (from the past 240 hours)  Resp panel by RT-PCR (RSV, Flu A&B, Covid) Anterior Nasal Swab     Status: Abnormal   Collection Time: 11/23/24  1:31 PM   Specimen: Anterior Nasal Swab  Result Value Ref Range Status   SARS Coronavirus 2 by RT PCR NEGATIVE NEGATIVE Final    Comment: (NOTE) SARS-CoV-2 target nucleic acids are NOT DETECTED.  The SARS-CoV-2 RNA is generally detectable in upper respiratory specimens during the acute phase of infection. The lowest concentration of SARS-CoV-2 viral copies this assay can detect is 138 copies/mL. A negative result does not preclude SARS-Cov-2 infection and should not be used as the sole basis for treatment or other patient management decisions. A negative result may occur with  improper specimen  collection/handling, submission of specimen other than nasopharyngeal swab, presence of viral mutation(s) within the areas targeted by this assay, and inadequate number of viral copies(<138 copies/mL). A negative result must be combined with clinical observations, patient history, and epidemiological information. The expected result is Negative.  Fact Sheet for Patients:  bloggercourse.com  Fact Sheet for Healthcare Providers:  seriousbroker.it  This test is  no t yet approved or cleared by the [place]  FDA and  has been authorized for detection and/or diagnosis of SARS-CoV-2 by FDA under an Emergency Use Authorization (EUA). This EUA will remain  in effect (meaning this test can be used) for the duration of the COVID-19 declaration under Section 564(b)(1) of the Act, 21 U.S.C.section 360bbb-3(b)(1), unless the authorization is terminated  or revoked sooner.       Influenza A by PCR POSITIVE (A) NEGATIVE Final   Influenza B by PCR NEGATIVE NEGATIVE Final    Comment: (NOTE) The Xpert Xpress SARS-CoV-2/FLU/RSV plus assay is intended as an aid in the diagnosis of influenza from Nasopharyngeal swab specimens and should not be used as a sole basis for treatment. Nasal washings and aspirates are unacceptable for Xpert Xpress SARS-CoV-2/FLU/RSV testing.  Fact Sheet for Patients: bloggercourse.com  Fact Sheet for Healthcare Providers: seriousbroker.it  This test is not yet approved or cleared by the [place]  FDA and has been authorized for detection and/or diagnosis of SARS-CoV-2 by FDA under an Emergency Use Authorization (EUA). This EUA will remain in effect (meaning this test can be used) for the duration of the COVID-19 declaration under Section 564(b)(1) of the Act, 21 U.S.C. section 360bbb-3(b)(1), unless the authorization is terminated or revoked.     Resp Syncytial  Virus by PCR NEGATIVE NEGATIVE Final    Comment: (NOTE) Fact Sheet for Patients: bloggercourse.com  Fact Sheet for Healthcare Providers: seriousbroker.it  This test is not yet approved or cleared by the [place]  FDA and has been authorized for detection and/or diagnosis of SARS-CoV-2 by FDA under an Emergency Use Authorization (EUA). This EUA will remain in effect (meaning this test can be used) for the duration of the COVID-19 declaration under Section 564(b)(1) of the Act, 21 U.S.C. section 360bbb-3(b)(1), unless the authorization is terminated or revoked.  Performed at [facility], [address] [place], [place] [address]   Culture, blood (Routine X 2) w Reflex to ID Panel     Status: None (Preliminary result)   Collection Time: 11/23/24  2:33 PM   Specimen: BLOOD RIGHT HAND  Result Value Ref Range Status   Specimen Description   Final    BLOOD RIGHT HAND Performed at [facility], [address], [place], [place] [address]    Special Requests   Final    BOTTLES DRAWN AEROBIC AND ANAEROBIC Blood Culture results may not be optimal due to an inadequate volume of blood received in culture bottles Performed at [facility], [address], [place], [place] [address]    Culture  Setup Time   Final    GRAM POSITIVE COCCI ANAEROBIC BOTTLE ONLY CRITICAL RESULT CALLED TO, READ BACK BY AND VERIFIED WITH: PHARMD JUSTIN LEGGE [id number] AT 1447 BY EC GRAM POSITIVE RODS AEROBIC BOTTLE ONLY PHARMD WOFFORD 11/25/2024 @0655  BY AB    Culture   Final    GRAM POSITIVE COCCI IDENTIFICATION TO FOLLOW Performed at [facility], [address], [place], [place] [address]    Report Status PENDING  Incomplete  Blood Culture ID Panel (Reflexed)     Status: Abnormal   Collection Time: 11/23/24  2:33 PM  Result Value Ref Range Status   Enterococcus faecalis NOT DETECTED NOT DETECTED Final    Enterococcus Faecium NOT DETECTED NOT DETECTED Final   Listeria monocytogenes NOT DETECTED NOT DETECTED Final   Staphylococcus species DETECTED (A) NOT DETECTED Final    Comment: CRITICAL RESULT CALLED  TO, READ BACK BY AND VERIFIED WITH: PHARMD JUSTIN LEGGE [id number] AT 1447 BY EC    Staphylococcus aureus (BCID) NOT DETECTED NOT DETECTED Final   Staphylococcus epidermidis NOT DETECTED NOT DETECTED Final   Staphylococcus lugdunensis NOT DETECTED NOT DETECTED Final   Streptococcus species NOT DETECTED NOT DETECTED Final   Streptococcus agalactiae NOT DETECTED NOT DETECTED Final   Streptococcus pneumoniae NOT DETECTED NOT DETECTED Final   Streptococcus pyogenes NOT DETECTED NOT DETECTED Final   A.calcoaceticus-baumannii NOT DETECTED NOT DETECTED Final   Bacteroides fragilis NOT DETECTED NOT DETECTED Final   Enterobacterales NOT DETECTED NOT DETECTED Final   Enterobacter cloacae complex NOT DETECTED NOT DETECTED Final   Escherichia coli NOT DETECTED NOT DETECTED Final   Klebsiella aerogenes NOT DETECTED NOT DETECTED Final   Klebsiella oxytoca NOT DETECTED NOT DETECTED Final   Klebsiella pneumoniae NOT DETECTED NOT DETECTED Final   Proteus species NOT DETECTED NOT DETECTED Final   Salmonella species NOT DETECTED NOT DETECTED Final   Serratia marcescens NOT DETECTED NOT DETECTED Final   Haemophilus influenzae NOT DETECTED NOT DETECTED Final   Neisseria meningitidis NOT DETECTED NOT DETECTED Final   Pseudomonas aeruginosa NOT DETECTED NOT DETECTED Final   Stenotrophomonas maltophilia NOT DETECTED NOT DETECTED Final   Candida albicans NOT DETECTED NOT DETECTED Final   Candida auris NOT DETECTED NOT DETECTED Final   Candida glabrata NOT DETECTED NOT DETECTED Final   Candida krusei NOT DETECTED NOT DETECTED Final   Candida parapsilosis NOT DETECTED NOT DETECTED Final   Candida tropicalis NOT DETECTED NOT DETECTED Final   Cryptococcus neoformans/gattii NOT DETECTED NOT DETECTED Final     Comment: Performed at [facility], [address], [place], [place] [address]  Culture, blood (Routine X 2) w Reflex to ID Panel     Status: None (Preliminary result)   Collection Time: 11/23/24  7:19 PM   Specimen: BLOOD RIGHT HAND  Result Value Ref Range Status   Specimen Description   Final    BLOOD RIGHT HAND Performed at [facility], [address], [place], [place] [address]    Special Requests   Final    BOTTLES DRAWN AEROBIC ONLY Blood Culture adequate volume Performed at [facility], [address], [place], [place] [address]    Culture   Final    NO GROWTH 2 DAYS Performed at [facility], [address], [place], [place] [address]    Report Status PENDING  Incomplete    Radiology Studies: No results found.     Aryka Coonradt T. Larell Baney [facility]  If 7PM-7AM, please contact night-coverage [email or web address] 11/25/2024, 12:31 PM   "

## 2024-11-25 NOTE — Progress Notes (Signed)
 PHARMACY - PHYSICIAN COMMUNICATION CRITICAL VALUE ALERT - BLOOD CULTURE IDENTIFICATION (BCID)  Sharon Mcdaniel is an 89 y.o. female who presented to [facility] on 11/23/2024 with a chief complaint of SOB & Flu  Assessment:  1/2 BCx previously growing staph species in anaerobic bottle (felt to be contam); now with aerobic bottle of same set growing GPR  Name of physician (or Provider) ContactedBETHA Bump  Current antibiotics: Rocephin   Changes to prescribed antibiotics recommended: GPR (also typically associated with contamination) growing from a set already known to be contaminated further supports contamination; would continue Rocephin  as ordered  Recommendations accepted by provider  Laurelai Lepp A 11/25/2024  7:02 AM

## 2024-11-26 DIAGNOSIS — J441 Chronic obstructive pulmonary disease with (acute) exacerbation: Secondary | ICD-10-CM | POA: Diagnosis not present

## 2024-11-26 DIAGNOSIS — K219 Gastro-esophageal reflux disease without esophagitis: Secondary | ICD-10-CM | POA: Diagnosis not present

## 2024-11-26 DIAGNOSIS — I10 Essential (primary) hypertension: Secondary | ICD-10-CM | POA: Diagnosis not present

## 2024-11-26 DIAGNOSIS — J9601 Acute respiratory failure with hypoxia: Secondary | ICD-10-CM | POA: Diagnosis not present

## 2024-11-26 LAB — CBC
HCT: 35 % — ABNORMAL LOW (ref 36.0–46.0)
Hemoglobin: 11.1 g/dL — ABNORMAL LOW (ref 12.0–15.0)
MCH: 27.9 pg (ref 26.0–34.0)
MCHC: 31.7 g/dL (ref 30.0–36.0)
MCV: 87.9 fL (ref 80.0–100.0)
Platelets: 151 10*3/uL (ref 150–400)
RBC: 3.98 MIL/uL (ref 3.87–5.11)
RDW: 14.8 % (ref 11.5–15.5)
WBC: 7.6 10*3/uL (ref 4.0–10.5)
nRBC: 0 % (ref 0.0–0.2)

## 2024-11-26 LAB — RENAL FUNCTION PANEL
Albumin: 3.1 g/dL — ABNORMAL LOW (ref 3.5–5.0)
Anion gap: 9 (ref 5–15)
BUN: 18 mg/dL (ref 8–23)
CO2: 26 mmol/L (ref 22–32)
Calcium: 8.7 mg/dL — ABNORMAL LOW (ref 8.9–10.3)
Chloride: 103 mmol/L (ref 98–111)
Creatinine, Ser: 0.87 mg/dL (ref 0.44–1.00)
GFR, Estimated: >60 mL/min
Glucose, Bld: 83 mg/dL (ref 70–99)
Phosphorus: 3.3 mg/dL (ref 2.5–4.6)
Potassium: 4.1 mmol/L (ref 3.5–5.1)
Sodium: 138 mmol/L (ref 135–145)

## 2024-11-26 LAB — LACTIC ACID, PLASMA: Lactic Acid, Venous: 2 mmol/L (ref 0.5–1.9)

## 2024-11-26 LAB — MAGNESIUM: Magnesium: 2 mg/dL (ref 1.7–2.4)

## 2024-11-26 LAB — CULTURE, BLOOD (ROUTINE X 2)

## 2024-11-26 MED ORDER — LOSARTAN POTASSIUM 50 MG PO TABS
50.0000 mg | ORAL_TABLET | Freq: Every day | ORAL | Status: DC
  Administered 2024-11-26 – 2024-11-27 (×2): 50 mg via ORAL
  Filled 2024-11-26 (×2): qty 1

## 2024-11-26 MED ORDER — CALCIUM CARBONATE ANTACID 500 MG PO CHEW
1.0000 | CHEWABLE_TABLET | Freq: Three times a day (TID) | ORAL | Status: DC | PRN
  Administered 2024-11-26: 200 mg via ORAL
  Filled 2024-11-26: qty 1

## 2024-11-26 NOTE — Plan of Care (Signed)

## 2024-11-26 NOTE — Progress Notes (Signed)
 " PROGRESS NOTE  Sharon Mcdaniel FMW:[id number] DOB: [birth year]   PCP: Burney Darice LITTIE, MD  Patient is from: Home.  Lives with daughter.  Independently ambulates at baseline.  DOA: 11/23/2024 LOS: 3  Chief complaints Chief Complaint  Patient presents with   Shortness of Breath     Brief Narrative / Interim history: 89 year old F with PMH of COPD/asthma, pulmonary fibrosis, allergic rhinitis, prior tobacco use that she quit in 2005, PVD, HLD, hypothyroidism and mild cognitive impairment on Aricept  brought to ED by EMS due to shortness of breath, productive cough, wheezing, sore throat, fatigue, malaise, fever and chills, and admitted with working diagnosis of acute respiratory failure with hypoxia due to COPD exacerbation in the setting of influenza A infection.  Patient was started on prednisone  and doxycycline  the day prior to presentation without significant relief.  Patient received IV Solu-Medrol  and DuoNebs en route to ED  In ED, briefly tachypneic to 30s.  No oxygen requirement.  Slightly elevated BP.  WBC 14.6. Cr 1.21.  Otherwise, BMP and CBC without significant finding.  Lactic acid 2.7.  Influenza A PCR positive.  Influenza B, COVID-19 and RSV nonreactive.  CXR without acute finding.  Blood cultures drawn.  Patient was started on Tamiflu , nebulizers, ceftriaxone  and doxycycline  and admitted for further care.  Slowly improving.   Subjective: Seen and examined earlier this morning.  No major events overnight or this morning.  Reports a little improvement.  Continues to endorse dyspnea with exertion and productive cough.  Reports feeling weak.  Assessment and plan: Influenza A infection COPD with acute exacerbation due to influenza A infection Acute respiratory failure ruled out-no documented desaturation. -CXR without acute finding.  No oxygen requirement. -Blood cultures NGTD.  Has lactic acidosis but not toxic. -Continue prednisone  40 mg daily.  Continue Tamiflu  -Continue  ceftriaxone  and doxycycline  given productive cough.  -Continue Brovana , Pulmicort  and Yupelri .  DuoNebs as needed -Mucolytic's, antitussive , incentive spirometry, flutter valve, OOB, PT and OT -Continue PPI for GI prophylaxis  History of postinflammatory pulmonary fibrosis? High-resolution CT in 2017 with no evidence of ILD but stable biapical pleuroparenchymal scarring. - Outpatient follow-up with pulmonology  Uncontrolled hypertension with wide pulse pressure: SBP 170s and DBP in 40s.  TTE without significant finding. -Continue home Cardizem  CD 120 mg daily - Crease losartan  to 50 mg daily -P.o. hydralazine  as needed.  Lactic acidosis: Doubt sepsis.  Hemodynamically stable.  Resolving. - Will recheck in the morning - Continue holding Lasix   Positive blood culture: GPC and GPR in 1 out of 2 anaerobic bottles likely contaminant.  Generalized weakness: Likely due to acute illness.  Independently ambulates at baseline - PT/OT recommended home health PT/OT.  Mild cognitive impairment?  Seems to be on Aricept .  She is awake and alert and oriented x 4. - Continue home Aricept  - Reorientation and delirium precaution  PVD/hyperlipidemia - Continue Lipitor aspirin   Hypothyroidism - Continue Synthroid   GERD - Continue PPI  History of tobacco use: Quit smoking in 2005.  Body mass index is 26.49 kg/m.          DVT prophylaxis:  enoxaparin  (LOVENOX ) injection 40 mg Start: 11/23/24 2200  Code Status: Full code Family Communication: None at bedside today. Level of care: Med-Surg Status is: Inpatient Remains inpatient appropriate because: Influenza A infection, COPD exacerbation and lactic acidosis   Final disposition: Home   55 minutes with more than 50% spent in reviewing records, counseling patient/family and coordinating care.  Consultants:  None  Procedures: None  Microbiology summarized: Influenza A PCR positive Influenza B, COVID-19 and RSV P  nonreactive Blood cultures with GPC and GPR in 1 out of 4 bottles.  Objective: Vitals:   11/26/24 0547 11/26/24 0847 11/26/24 0850 11/26/24 0851  BP: (!) 175/67  (!) 167/54   Pulse:   62   Resp:      Temp:      TempSrc:      SpO2:  91%  93%  Weight:      Height:        Examination:  GENERAL: No apparent distress.  Nontoxic. HEENT: MMM.  Vision and hearing grossly intact.  NECK: Supple.  No apparent JVD.  RESP:  No IWOB.  Fair aeration bilaterally.  Rhonchi on exam. CVS:  RRR. Heart sounds normal.  ABD/GI/GU: BS+. Abd soft, NTND.  MSK/EXT:  Moves extremities. No apparent deformity. No edema.  SKIN: Small bruise over LLE NEURO: AA.  Oriented appropriately.  No apparent focal neuro deficit. PSYCH: Calm. Normal affect.   Sch Meds:  Scheduled Meds:  arformoterol   15 mcg Nebulization BID   aspirin  EC  81 mg Oral Daily   atorvastatin   40 mg Oral QHS   budesonide  (PULMICORT ) nebulizer solution  0.5 mg Nebulization BID   diltiazem   120 mg Oral Daily   donepezil   10 mg Oral QHS   doxycycline   100 mg Oral Q12H   enoxaparin  (LOVENOX ) injection  40 mg Subcutaneous Q24H   levothyroxine   112 mcg Oral Daily   losartan   50 mg Oral Daily   mirtazapine   15 mg Oral QHS   montelukast   10 mg Oral QHS   oseltamivir   30 mg Oral BID   pantoprazole   40 mg Oral QAC breakfast   predniSONE   40 mg Oral Q breakfast   revefenacin   175 mcg Nebulization Daily   Continuous Infusions:  cefTRIAXone  (ROCEPHIN )  IV 2 g (11/26/24 0913)   PRN Meds:.acetaminophen  **OR** acetaminophen , hydrALAZINE , ipratropium-albuterol , ondansetron  **OR** ondansetron  (ZOFRAN ) IV  Antimicrobials: Anti-infectives (From admission, onward)    Start     Dose/Rate Route Frequency Ordered Stop   11/25/24 1400  cefTRIAXone  (ROCEPHIN ) 2 g in sodium chloride  0.9 % 100 mL IVPB        2 g 200 mL/hr over 30 Minutes Intravenous Daily 11/25/24 0915     11/24/24 2200  oseltamivir  (TAMIFLU ) capsule 30 mg        30 mg Oral 2 times  daily 11/24/24 1105 11/28/24 0959   11/23/24 2200  doxycycline  (VIBRA -TABS) tablet 100 mg        100 mg Oral Every 12 hours 11/23/24 1430     11/23/24 1900  oseltamivir  (TAMIFLU ) capsule 30 mg  Status:  Discontinued        30 mg Oral Daily 11/23/24 1805 11/24/24 1105   11/23/24 1415  cefTRIAXone  (ROCEPHIN ) 1 g in sodium chloride  0.9 % 100 mL IVPB  Status:  Discontinued        1 g 200 mL/hr over 30 Minutes Intravenous Every 24 hours 11/23/24 1412 11/25/24 0915   11/23/24 1415  doxycycline  (VIBRA -TABS) tablet 100 mg        100 mg Oral  Once 11/23/24 1412 11/23/24 1439        I have personally reviewed the following labs and images: CBC: Recent Labs  Lab 11/23/24 1219 11/24/24 0835 11/25/24 0546 11/26/24 0659  WBC 14.6* 8.7 7.3 7.6  HGB 13.5 12.7 12.0 11.1*  HCT 43.6 39.9 38.5 35.0*  MCV 90.6 88.1 90.2  87.9  PLT 151 154 130* 151   BMP &GFR Recent Labs  Lab 11/23/24 1219 11/24/24 0835 11/25/24 0546 11/26/24 0659  NA 136 137 139 138  K 3.7 3.6 3.5 4.1  CL 97* 99 104 103  CO2 24 24 23 26   GLUCOSE 108* 105* 86 83  BUN 21 23 22 18   CREATININE 1.21* 1.04* 0.96 0.87  CALCIUM  9.0 9.0 8.6* 8.7*  MG  --   --  2.1 2.0  PHOS  --   --  3.4 3.3   Estimated Creatinine Clearance: 41.3 mL/min (by C-G formula based on SCr of 0.87 mg/dL). Liver & Pancreas: Recent Labs  Lab 11/24/24 0835 11/25/24 0546 11/26/24 0659  AST 26  --   --   ALT 15  --   --   ALKPHOS 89  --   --   BILITOT 0.2  --   --   PROT 6.4*  --   --   ALBUMIN 3.7 3.3* 3.1*   No results for input(s): LIPASE, AMYLASE in the last 168 hours. No results for input(s): AMMONIA in the last 168 hours. Diabetic: No results for input(s): HGBA1C in the last 72 hours. No results for input(s): GLUCAP in the last 168 hours. Cardiac Enzymes: No results for input(s): CKTOTAL, CKMB, CKMBINDEX, TROPONINI in the last 168 hours. No results for input(s): PROBNP in the last 8760 hours. Coagulation  Profile: No results for input(s): INR, PROTIME in the last 168 hours. Thyroid  Function Tests: No results for input(s): TSH, T4TOTAL, FREET4, T3FREE, THYROIDAB in the last 72 hours. Lipid Profile: No results for input(s): CHOL, HDL, LDLCALC, TRIG, CHOLHDL, LDLDIRECT in the last 72 hours. Anemia Panel: No results for input(s): VITAMINB12, FOLATE, FERRITIN, TIBC, IRON, RETICCTPCT in the last 72 hours. Urine analysis:    Component Value Date/Time   COLORURINE YELLOW 04/27/2020 1958   APPEARANCEUR CLEAR 04/27/2020 1958   LABSPEC 1.011 04/27/2020 1958   PHURINE 6.0 04/27/2020 1958   GLUCOSEU NEGATIVE 04/27/2020 1958   HGBUR NEGATIVE 04/27/2020 1958   BILIRUBINUR NEGATIVE 04/27/2020 1958   KETONESUR NEGATIVE 04/27/2020 1958   PROTEINUR NEGATIVE 04/27/2020 1958   UROBILINOGEN 0.2 11/03/2011 1220   NITRITE NEGATIVE 04/27/2020 1958   LEUKOCYTESUR MODERATE (A) 04/27/2020 1958   Sepsis Labs: Invalid input(s): PROCALCITONIN, LACTICIDVEN  Microbiology: Recent Results (from the past 240 hours)  Resp panel by RT-PCR (RSV, Flu A&B, Covid) Anterior Nasal Swab     Status: Abnormal   Collection Time: 11/23/24  1:31 PM   Specimen: Anterior Nasal Swab  Result Value Ref Range Status   SARS Coronavirus 2 by RT PCR NEGATIVE NEGATIVE Final    Comment: (NOTE) SARS-CoV-2 target nucleic acids are NOT DETECTED.  The SARS-CoV-2 RNA is generally detectable in upper respiratory specimens during the acute phase of infection. The lowest concentration of SARS-CoV-2 viral copies this assay can detect is 138 copies/mL. A negative result does not preclude SARS-Cov-2 infection and should not be used as the sole basis for treatment or other patient management decisions. A negative result may occur with  improper specimen collection/handling, submission of specimen other than nasopharyngeal swab, presence of viral mutation(s) within the areas targeted by this assay, and  inadequate number of viral copies(<138 copies/mL). A negative result must be combined with clinical observations, patient history, and epidemiological information. The expected result is Negative.  Fact Sheet for Patients:  bloggercourse.com  Fact Sheet for Healthcare Providers:  seriousbroker.it  This test is no t yet approved or cleared by the  [place]  FDA and  has been authorized for detection and/or diagnosis of SARS-CoV-2 by FDA under an Emergency Use Authorization (EUA). This EUA will remain  in effect (meaning this test can be used) for the duration of the COVID-19 declaration under Section 564(b)(1) of the Act, 21 U.S.C.section 360bbb-3(b)(1), unless the authorization is terminated  or revoked sooner.       Influenza A by PCR POSITIVE (A) NEGATIVE Final   Influenza B by PCR NEGATIVE NEGATIVE Final    Comment: (NOTE) The Xpert Xpress SARS-CoV-2/FLU/RSV plus assay is intended as an aid in the diagnosis of influenza from Nasopharyngeal swab specimens and should not be used as a sole basis for treatment. Nasal washings and aspirates are unacceptable for Xpert Xpress SARS-CoV-2/FLU/RSV testing.  Fact Sheet for Patients: bloggercourse.com  Fact Sheet for Healthcare Providers: seriousbroker.it  This test is not yet approved or cleared by the [place]  FDA and has been authorized for detection and/or diagnosis of SARS-CoV-2 by FDA under an Emergency Use Authorization (EUA). This EUA will remain in effect (meaning this test can be used) for the duration of the COVID-19 declaration under Section 564(b)(1) of the Act, 21 U.S.C. section 360bbb-3(b)(1), unless the authorization is terminated or revoked.     Resp Syncytial Virus by PCR NEGATIVE NEGATIVE Final    Comment: (NOTE) Fact Sheet for Patients: bloggercourse.com  Fact Sheet for  Healthcare Providers: seriousbroker.it  This test is not yet approved or cleared by the [place]  FDA and has been authorized for detection and/or diagnosis of SARS-CoV-2 by FDA under an Emergency Use Authorization (EUA). This EUA will remain in effect (meaning this test can be used) for the duration of the COVID-19 declaration under Section 564(b)(1) of the Act, 21 U.S.C. section 360bbb-3(b)(1), unless the authorization is terminated or revoked.  Performed at [facility], [address], [place], [place] [address]   Culture, blood (Routine X 2) w Reflex to ID Panel     Status: Abnormal   Collection Time: 11/23/24  2:33 PM   Specimen: BLOOD RIGHT HAND  Result Value Ref Range Status   Specimen Description   Final    BLOOD RIGHT HAND Performed at [facility], [address] [place], [place] [address]    Special Requests   Final    BOTTLES DRAWN AEROBIC AND ANAEROBIC Blood Culture results may not be optimal due to an inadequate volume of blood received in culture bottles Performed at [facility], [address], [place], [place] [address]    Culture  Setup Time   Final    GRAM POSITIVE COCCI ANAEROBIC BOTTLE ONLY CRITICAL RESULT CALLED TO, READ BACK BY AND VERIFIED WITH: PHARMD JUSTIN LEGGE [id number] AT 1447 BY EC GRAM POSITIVE RODS AEROBIC BOTTLE ONLY PHARMD WOFFORD 11/25/2024 @0655  BY AB    Culture (A)  Final    STAPHYLOCOCCUS HOMINIS THE SIGNIFICANCE OF ISOLATING THIS ORGANISM FROM A SINGLE SET OF BLOOD CULTURES WHEN MULTIPLE SETS ARE DRAWN IS UNCERTAIN. PLEASE NOTIFY THE MICROBIOLOGY DEPARTMENT WITHIN ONE WEEK IF SPECIATION AND SENSITIVITIES ARE REQUIRED. Performed at [facility], [address] [place], [place] [address]    Report Status 11/26/2024 FINAL  Final  Blood Culture ID Panel (Reflexed)     Status: Abnormal   Collection Time: 11/23/24  2:33 PM  Result Value Ref Range Status    Enterococcus faecalis NOT DETECTED NOT DETECTED Final   Enterococcus Faecium NOT DETECTED NOT DETECTED Final   Listeria monocytogenes  NOT DETECTED NOT DETECTED Final   Staphylococcus species DETECTED (A) NOT DETECTED Final    Comment: CRITICAL RESULT CALLED TO, READ BACK BY AND VERIFIED WITH: PHARMD JUSTIN LEGGE [id number] AT 1447 BY EC    Staphylococcus aureus (BCID) NOT DETECTED NOT DETECTED Final   Staphylococcus epidermidis NOT DETECTED NOT DETECTED Final   Staphylococcus lugdunensis NOT DETECTED NOT DETECTED Final   Streptococcus species NOT DETECTED NOT DETECTED Final   Streptococcus agalactiae NOT DETECTED NOT DETECTED Final   Streptococcus pneumoniae NOT DETECTED NOT DETECTED Final   Streptococcus pyogenes NOT DETECTED NOT DETECTED Final   A.calcoaceticus-baumannii NOT DETECTED NOT DETECTED Final   Bacteroides fragilis NOT DETECTED NOT DETECTED Final   Enterobacterales NOT DETECTED NOT DETECTED Final   Enterobacter cloacae complex NOT DETECTED NOT DETECTED Final   Escherichia coli NOT DETECTED NOT DETECTED Final   Klebsiella aerogenes NOT DETECTED NOT DETECTED Final   Klebsiella oxytoca NOT DETECTED NOT DETECTED Final   Klebsiella pneumoniae NOT DETECTED NOT DETECTED Final   Proteus species NOT DETECTED NOT DETECTED Final   Salmonella species NOT DETECTED NOT DETECTED Final   Serratia marcescens NOT DETECTED NOT DETECTED Final   Haemophilus influenzae NOT DETECTED NOT DETECTED Final   Neisseria meningitidis NOT DETECTED NOT DETECTED Final   Pseudomonas aeruginosa NOT DETECTED NOT DETECTED Final   Stenotrophomonas maltophilia NOT DETECTED NOT DETECTED Final   Candida albicans NOT DETECTED NOT DETECTED Final   Candida auris NOT DETECTED NOT DETECTED Final   Candida glabrata NOT DETECTED NOT DETECTED Final   Candida krusei NOT DETECTED NOT DETECTED Final   Candida parapsilosis NOT DETECTED NOT DETECTED Final   Candida tropicalis NOT DETECTED NOT DETECTED Final   Cryptococcus  neoformans/gattii NOT DETECTED NOT DETECTED Final    Comment: Performed at [facility], [address] [place], [place] [address]  Culture, blood (Routine X 2) w Reflex to ID Panel     Status: None (Preliminary result)   Collection Time: 11/23/24  7:19 PM   Specimen: BLOOD RIGHT HAND  Result Value Ref Range Status   Specimen Description   Final    BLOOD RIGHT HAND Performed at [facility], [address], [place], [place] [address]    Special Requests   Final    BOTTLES DRAWN AEROBIC ONLY Blood Culture adequate volume Performed at [facility], [address] [place], [place] [address]    Culture   Final    NO GROWTH 3 DAYS Performed at [facility], [address], [place], [place] [address]    Report Status PENDING  Incomplete    Radiology Studies: No results found.     Trayshawn Durkin T. Virlee Stroschein [facility]  If 7PM-7AM, please contact night-coverage [email or web address] 11/26/2024, 1:48 PM   "

## 2024-11-26 NOTE — Plan of Care (Signed)

## 2024-11-27 ENCOUNTER — Other Ambulatory Visit (HOSPITAL_COMMUNITY): Payer: Self-pay

## 2024-11-27 DIAGNOSIS — J101 Influenza due to other identified influenza virus with other respiratory manifestations: Secondary | ICD-10-CM | POA: Diagnosis not present

## 2024-11-27 DIAGNOSIS — E872 Acidosis, unspecified: Secondary | ICD-10-CM | POA: Diagnosis not present

## 2024-11-27 DIAGNOSIS — J441 Chronic obstructive pulmonary disease with (acute) exacerbation: Secondary | ICD-10-CM | POA: Diagnosis not present

## 2024-11-27 DIAGNOSIS — E782 Mixed hyperlipidemia: Secondary | ICD-10-CM | POA: Diagnosis not present

## 2024-11-27 DIAGNOSIS — K219 Gastro-esophageal reflux disease without esophagitis: Secondary | ICD-10-CM | POA: Diagnosis not present

## 2024-11-27 DIAGNOSIS — I10 Essential (primary) hypertension: Secondary | ICD-10-CM | POA: Diagnosis not present

## 2024-11-27 DIAGNOSIS — E039 Hypothyroidism, unspecified: Secondary | ICD-10-CM | POA: Diagnosis not present

## 2024-11-27 DIAGNOSIS — J9601 Acute respiratory failure with hypoxia: Secondary | ICD-10-CM | POA: Diagnosis not present

## 2024-11-27 LAB — CBC
HCT: 35.6 % — ABNORMAL LOW (ref 36.0–46.0)
Hemoglobin: 11.7 g/dL — ABNORMAL LOW (ref 12.0–15.0)
MCH: 27.9 pg (ref 26.0–34.0)
MCHC: 32.9 g/dL (ref 30.0–36.0)
MCV: 85 fL (ref 80.0–100.0)
Platelets: 138 10*3/uL — ABNORMAL LOW (ref 150–400)
RBC: 4.19 MIL/uL (ref 3.87–5.11)
RDW: 14.6 % (ref 11.5–15.5)
WBC: 8 10*3/uL (ref 4.0–10.5)
nRBC: 0 % (ref 0.0–0.2)

## 2024-11-27 LAB — RENAL FUNCTION PANEL
Albumin: 3.2 g/dL — ABNORMAL LOW (ref 3.5–5.0)
Anion gap: 9 (ref 5–15)
BUN: 20 mg/dL (ref 8–23)
CO2: 27 mmol/L (ref 22–32)
Calcium: 8.8 mg/dL — ABNORMAL LOW (ref 8.9–10.3)
Chloride: 102 mmol/L (ref 98–111)
Creatinine, Ser: 0.88 mg/dL (ref 0.44–1.00)
GFR, Estimated: >60 mL/min
Glucose, Bld: 85 mg/dL (ref 70–99)
Phosphorus: 3.8 mg/dL (ref 2.5–4.6)
Potassium: 3.8 mmol/L (ref 3.5–5.1)
Sodium: 138 mmol/L (ref 135–145)

## 2024-11-27 LAB — MAGNESIUM: Magnesium: 1.9 mg/dL (ref 1.7–2.4)

## 2024-11-27 LAB — LACTIC ACID, PLASMA: Lactic Acid, Venous: 0.7 mmol/L (ref 0.5–1.9)

## 2024-11-27 MED ORDER — BUDESONIDE 0.5 MG/2ML IN SUSP
0.5000 mg | Freq: Two times a day (BID) | RESPIRATORY_TRACT | 0 refills | Status: AC
  Filled 2024-11-27: qty 360, 90d supply, fill #0

## 2024-11-27 MED ORDER — DOXYCYCLINE HYCLATE 100 MG PO CAPS: 100.0000 mg | ORAL_CAPSULE | Freq: Two times a day (BID) | ORAL | Status: DC

## 2024-11-27 MED ORDER — ACETAMINOPHEN 325 MG PO TABS: 650.0000 mg | ORAL_TABLET | Freq: Four times a day (QID) | ORAL | Status: AC | PRN

## 2024-11-27 MED ORDER — ARFORMOTEROL TARTRATE 15 MCG/2ML IN NEBU
15.0000 ug | INHALATION_SOLUTION | Freq: Two times a day (BID) | RESPIRATORY_TRACT | 0 refills | Status: AC
  Filled 2024-11-27: qty 120, 30d supply, fill #0

## 2024-11-27 MED ORDER — BUDESON-GLYCOPYRROL-FORMOTEROL 160-9-4.8 MCG/ACT IN AERO
2.0000 | INHALATION_SPRAY | Freq: Two times a day (BID) | RESPIRATORY_TRACT | Status: DC
  Administered 2024-11-27: 2 via RESPIRATORY_TRACT
  Filled 2024-11-27: qty 5.9

## 2024-11-27 MED ORDER — LOSARTAN POTASSIUM 50 MG PO TABS
25.0000 mg | ORAL_TABLET | Freq: Once | ORAL | Status: AC
  Administered 2024-11-27: 25 mg via ORAL
  Filled 2024-11-27: qty 1

## 2024-11-27 MED ORDER — IPRATROPIUM-ALBUTEROL 0.5-2.5 (3) MG/3ML IN SOLN
3.0000 mL | Freq: Four times a day (QID) | RESPIRATORY_TRACT | 2 refills | Status: AC | PRN
  Filled 2024-11-27: qty 360, 30d supply, fill #0

## 2024-11-27 MED ORDER — TRELEGY ELLIPTA 100-62.5-25 MCG/ACT IN AEPB
1.0000 | INHALATION_SPRAY | Freq: Every day | RESPIRATORY_TRACT | 0 refills | Status: DC
  Filled 2024-11-27: qty 60, fill #0

## 2024-11-27 MED ORDER — LOSARTAN POTASSIUM 50 MG PO TABS
75.0000 mg | ORAL_TABLET | Freq: Every day | ORAL | Status: DC
  Administered 2024-11-28: 75 mg via ORAL
  Filled 2024-11-27: qty 2

## 2024-11-27 MED ORDER — ARFORMOTEROL TARTRATE 15 MCG/2ML IN NEBU
15.0000 ug | INHALATION_SOLUTION | Freq: Two times a day (BID) | RESPIRATORY_TRACT | Status: DC
  Administered 2024-11-27 – 2024-11-28 (×3): 15 ug via RESPIRATORY_TRACT
  Filled 2024-11-27 (×3): qty 2

## 2024-11-27 MED ORDER — BUDESONIDE 0.5 MG/2ML IN SUSP
0.5000 mg | Freq: Two times a day (BID) | RESPIRATORY_TRACT | Status: DC
  Administered 2024-11-27 – 2024-11-28 (×3): 0.5 mg via RESPIRATORY_TRACT
  Filled 2024-11-27 (×3): qty 2

## 2024-11-27 MED ORDER — TRELEGY ELLIPTA 200-62.5-25 MCG/ACT IN AEPB
1.0000 | INHALATION_SPRAY | Freq: Every day | RESPIRATORY_TRACT | 1 refills | Status: DC
  Filled 2024-11-27: qty 60, 30d supply, fill #0

## 2024-11-27 NOTE — Plan of Care (Signed)

## 2024-11-27 NOTE — Progress Notes (Signed)
 " PROGRESS NOTE  Sharon Mcdaniel FMW:[id number] DOB: May 27, 1936   PCP: Burney Darice LITTIE, MD  Patient is from: Home.  Lives with daughter.  Independently ambulates at baseline.  DOA: 11/23/2024 LOS: 4  Chief complaints Chief Complaint  Patient presents with   Shortness of Breath     Brief Narrative / Interim history: 89 year old F with PMH of COPD/asthma, pulmonary fibrosis, allergic rhinitis, prior tobacco use that she quit in 2005, PVD, HLD, hypothyroidism and mild cognitive impairment on Aricept  brought to ED by EMS due to shortness of breath, productive cough, wheezing, sore throat, fatigue, malaise, fever and chills, and admitted with working diagnosis of acute respiratory failure with hypoxia due to COPD exacerbation in the setting of influenza A infection.  Patient was started on prednisone  and doxycycline  the day prior to presentation without significant relief.  Patient received IV Solu-Medrol  and DuoNebs en route to ED  In ED, briefly tachypneic to 30s.  No oxygen requirement.  Slightly elevated BP.  WBC 14.6. Cr 1.21.  Otherwise, BMP and CBC without significant finding.  Lactic acid 2.7.  Influenza A PCR positive.  Influenza B, COVID-19 and RSV nonreactive.  CXR without acute finding.  Blood cultures drawn.  Patient was started on Tamiflu , nebulizers, ceftriaxone  and doxycycline  and admitted for further care.  Patient's respiratory symptoms improved.  Unable to go home due to inclement weather and power outage.   Subjective: Seen and examined earlier this morning.  No major events overnight or this morning.  Reports improvement in her breathing.  Patient anxious to go home due to inclement weather and concern for power outage.  [place] and [place] roads are not cleaned yet.  Daughter is also concerned about power outage since patient is dependent on nebulizers.  Assessment and plan: Influenza A infection COPD with acute exacerbation due to influenza A  infection Acute respiratory failure ruled out-no documented desaturation. -CXR without acute finding.  No oxygen requirement. -Blood cultures NGTD.  Has lactic acidosis but not toxic. -Continue prednisone  40 mg daily.  Continue Tamiflu  -Continue ceftriaxone  and doxycycline  given productive cough.  -Continue Brovana , Pulmicort  and Yupelri .  DuoNebs as needed.  Co-pay on triple inhaler very high. -Mucolytic's, antitussive , incentive spirometry, flutter valve, OOB, PT and OT -Continue PPI for GI prophylaxis  History of postinflammatory pulmonary fibrosis? High-resolution CT in 2017 with no evidence of ILD but stable biapical pleuroparenchymal scarring. - Outpatient follow-up with pulmonology  Uncontrolled hypertension: Improved.  TTE without significant finding. -Continue home Cardizem  CD 120 mg daily -Increase losartan  to 75 mg daily -P.o. hydralazine  as needed.  Lactic acidosis: Doubt sepsis.  Hemodynamically stable.  Resolved.  Positive blood culture: GPC and GPR in 1 out of 2 anaerobic bottles likely contaminant.  Generalized weakness: Likely due to acute illness.  Independently ambulates at baseline - HHPT/OT and RW ordered as recommended.  Mild cognitive impairment?  Seems to be on Aricept .  She is awake and alert and oriented x 4. - Continue home Aricept  - Reorientation and delirium precaution  PVD/hyperlipidemia - Continue Lipitor aspirin   Hypothyroidism - Continue Synthroid   GERD - Continue PPI  History of tobacco use: Quit smoking in 2005.  Body mass index is 26.49 kg/m.          DVT prophylaxis:  enoxaparin  (LOVENOX ) injection 40 mg Start: 11/23/24 2200  Code Status: Full code Family Communication: Updated patient's daughter over the phone. Level of care: Med-Surg Status is: Inpatient Remains inpatient appropriate because: Influenza A  infection, COPD exacerbation and lactic acidosis   Final disposition: Home   55 minutes with more than 50% spent in  reviewing records, counseling patient/family and coordinating care.  Consultants:  None  Procedures: None  Microbiology summarized: Influenza A PCR positive Influenza B, COVID-19 and RSV P nonreactive Blood cultures with GPC and GPR in 1 out of 4 bottles.  Objective: Vitals:   11/26/24 1934 11/26/24 2206 11/26/24 2210 11/27/24 0532  BP:  (!) 162/57 (!) 162/57 (!) 158/82  Pulse:  73  67  Resp:  17  18  Temp:  97.7 F (36.5 C)  97.7 F (36.5 C)  TempSrc:    Oral  SpO2: 93% 93%  95%  Weight:      Height:        Examination:  GENERAL: No apparent distress.  Nontoxic. HEENT: MMM.  Vision and hearing grossly intact.  NECK: Supple.  No apparent JVD.  RESP:  No IWOB.  Fair aeration bilaterally.  CVS:  RRR. Heart sounds normal.  ABD/GI/GU: BS+. Abd soft, NTND.  MSK/EXT:  Moves extremities. No apparent deformity. No edema.  SKIN: Small bruise over LLE NEURO: AA.  Oriented appropriately.  No apparent focal neuro deficit. PSYCH: Calm. Normal affect.   Sch Meds:  Scheduled Meds:  arformoterol   15 mcg Nebulization BID   aspirin  EC  81 mg Oral Daily   atorvastatin   40 mg Oral QHS   budesonide  (PULMICORT ) nebulizer solution  0.5 mg Nebulization BID   diltiazem   120 mg Oral Daily   donepezil   10 mg Oral QHS   doxycycline   100 mg Oral Q12H   enoxaparin  (LOVENOX ) injection  40 mg Subcutaneous Q24H   levothyroxine   112 mcg Oral Daily   losartan   50 mg Oral Daily   mirtazapine   15 mg Oral QHS   montelukast   10 mg Oral QHS   oseltamivir   30 mg Oral BID   pantoprazole   40 mg Oral QAC breakfast   predniSONE   40 mg Oral Q breakfast   Continuous Infusions:  cefTRIAXone  (ROCEPHIN )  IV 2 g (11/27/24 0957)   PRN Meds:.acetaminophen  **OR** acetaminophen , calcium  carbonate, hydrALAZINE , ipratropium-albuterol , ondansetron  **OR** ondansetron  (ZOFRAN ) IV  Antimicrobials: Anti-infectives (From admission, onward)    Start     Dose/Rate Route Frequency Ordered Stop   11/27/24 0000   doxycycline  (VIBRAMYCIN ) 100 MG capsule        100 mg Oral 2 times daily 11/27/24 0838 11/29/24 2359   11/25/24 1400  cefTRIAXone  (ROCEPHIN ) 2 g in sodium chloride  0.9 % 100 mL IVPB        2 g 200 mL/hr over 30 Minutes Intravenous Daily 11/25/24 0915     11/24/24 2200  oseltamivir  (TAMIFLU ) capsule 30 mg        30 mg Oral 2 times daily 11/24/24 1105 11/28/24 0959   11/23/24 2200  doxycycline  (VIBRA -TABS) tablet 100 mg        100 mg Oral Every 12 hours 11/23/24 1430     11/23/24 1900  oseltamivir  (TAMIFLU ) capsule 30 mg  Status:  Discontinued        30 mg Oral Daily 11/23/24 1805 11/24/24 1105   11/23/24 1415  cefTRIAXone  (ROCEPHIN ) 1 g in sodium chloride  0.9 % 100 mL IVPB  Status:  Discontinued        1 g 200 mL/hr over 30 Minutes Intravenous Every 24 hours 11/23/24 1412 11/25/24 0915   11/23/24 1415  doxycycline  (VIBRA -TABS) tablet 100 mg  100 mg Oral  Once 11/23/24 1412 11/23/24 1439        I have personally reviewed the following labs and images: CBC: Recent Labs  Lab 11/23/24 1219 11/24/24 0835 11/25/24 0546 11/26/24 0659 11/27/24 0521  WBC 14.6* 8.7 7.3 7.6 8.0  HGB 13.5 12.7 12.0 11.1* 11.7*  HCT 43.6 39.9 38.5 35.0* 35.6*  MCV 90.6 88.1 90.2 87.9 85.0  PLT 151 154 130* 151 138*   BMP &GFR Recent Labs  Lab 11/23/24 1219 11/24/24 0835 11/25/24 0546 11/26/24 0659 11/27/24 0521  NA 136 137 139 138 138  K 3.7 3.6 3.5 4.1 3.8  CL 97* 99 104 103 102  CO2 24 24 23 26 27   GLUCOSE 108* 105* 86 83 85  BUN 21 23 22 18 20   CREATININE 1.21* 1.04* 0.96 0.87 0.88  CALCIUM  9.0 9.0 8.6* 8.7* 8.8*  MG  --   --  2.1 2.0 1.9  PHOS  --   --  3.4 3.3 3.8   Estimated Creatinine Clearance: 40.8 mL/min (by C-G formula based on SCr of 0.88 mg/dL). Liver & Pancreas: Recent Labs  Lab 11/24/24 0835 11/25/24 0546 11/26/24 0659 11/27/24 0521  AST 26  --   --   --   ALT 15  --   --   --   ALKPHOS 89  --   --   --   BILITOT 0.2  --   --   --   PROT 6.4*  --   --   --    ALBUMIN 3.7 3.3* 3.1* 3.2*   No results for input(s): LIPASE, AMYLASE in the last 168 hours. No results for input(s): AMMONIA in the last 168 hours. Diabetic: No results for input(s): HGBA1C in the last 72 hours. No results for input(s): GLUCAP in the last 168 hours. Cardiac Enzymes: No results for input(s): CKTOTAL, CKMB, CKMBINDEX, TROPONINI in the last 168 hours. No results for input(s): PROBNP in the last 8760 hours. Coagulation Profile: No results for input(s): INR, PROTIME in the last 168 hours. Thyroid  Function Tests: No results for input(s): TSH, T4TOTAL, FREET4, T3FREE, THYROIDAB in the last 72 hours. Lipid Profile: No results for input(s): CHOL, HDL, LDLCALC, TRIG, CHOLHDL, LDLDIRECT in the last 72 hours. Anemia Panel: No results for input(s): VITAMINB12, FOLATE, FERRITIN, TIBC, IRON, RETICCTPCT in the last 72 hours. Urine analysis:    Component Value Date/Time   COLORURINE YELLOW 04/27/2020 1958   APPEARANCEUR CLEAR 04/27/2020 1958   LABSPEC 1.011 04/27/2020 1958   PHURINE 6.0 04/27/2020 1958   GLUCOSEU NEGATIVE 04/27/2020 1958   HGBUR NEGATIVE 04/27/2020 1958   BILIRUBINUR NEGATIVE 04/27/2020 1958   KETONESUR NEGATIVE 04/27/2020 1958   PROTEINUR NEGATIVE 04/27/2020 1958   UROBILINOGEN 0.2 11/03/2011 1220   NITRITE NEGATIVE 04/27/2020 1958   LEUKOCYTESUR MODERATE (A) 04/27/2020 1958   Sepsis Labs: Invalid input(s): PROCALCITONIN, LACTICIDVEN  Microbiology: Recent Results (from the past 240 hours)  Resp panel by RT-PCR (RSV, Flu A&B, Covid) Anterior Nasal Swab     Status: Abnormal   Collection Time: 11/23/24  1:31 PM   Specimen: Anterior Nasal Swab  Result Value Ref Range Status   SARS Coronavirus 2 by RT PCR NEGATIVE NEGATIVE Final    Comment: (NOTE) SARS-CoV-2 target nucleic acids are NOT DETECTED.  The SARS-CoV-2 RNA is generally detectable in upper respiratory specimens during the acute phase  of infection. The lowest concentration of SARS-CoV-2 viral copies this assay can detect is 138 copies/mL. A negative result does not preclude SARS-Cov-2  infection and should not be used as the sole basis for treatment or other patient management decisions. A negative result may occur with  improper specimen collection/handling, submission of specimen other than nasopharyngeal swab, presence of viral mutation(s) within the areas targeted by this assay, and inadequate number of viral copies(<138 copies/mL). A negative result must be combined with clinical observations, patient history, and epidemiological information. The expected result is Negative.  Fact Sheet for Patients:  bloggercourse.com  Fact Sheet for Healthcare Providers:  seriousbroker.it  This test is no t yet approved or cleared by the [place]  FDA and  has been authorized for detection and/or diagnosis of SARS-CoV-2 by FDA under an Emergency Use Authorization (EUA). This EUA will remain  in effect (meaning this test can be used) for the duration of the COVID-19 declaration under Section 564(b)(1) of the Act, 21 U.S.C.section 360bbb-3(b)(1), unless the authorization is terminated  or revoked sooner.       Influenza A by PCR POSITIVE (A) NEGATIVE Final   Influenza B by PCR NEGATIVE NEGATIVE Final    Comment: (NOTE) The Xpert Xpress SARS-CoV-2/FLU/RSV plus assay is intended as an aid in the diagnosis of influenza from Nasopharyngeal swab specimens and should not be used as a sole basis for treatment. Nasal washings and aspirates are unacceptable for Xpert Xpress SARS-CoV-2/FLU/RSV testing.  Fact Sheet for Patients: bloggercourse.com  Fact Sheet for Healthcare Providers: seriousbroker.it  This test is not yet approved or cleared by the [place]  FDA and has been authorized for detection and/or diagnosis of  SARS-CoV-2 by FDA under an Emergency Use Authorization (EUA). This EUA will remain in effect (meaning this test can be used) for the duration of the COVID-19 declaration under Section 564(b)(1) of the Act, 21 U.S.C. section 360bbb-3(b)(1), unless the authorization is terminated or revoked.     Resp Syncytial Virus by PCR NEGATIVE NEGATIVE Final    Comment: (NOTE) Fact Sheet for Patients: bloggercourse.com  Fact Sheet for Healthcare Providers: seriousbroker.it  This test is not yet approved or cleared by the [place]  FDA and has been authorized for detection and/or diagnosis of SARS-CoV-2 by FDA under an Emergency Use Authorization (EUA). This EUA will remain in effect (meaning this test can be used) for the duration of the COVID-19 declaration under Section 564(b)(1) of the Act, 21 U.S.C. section 360bbb-3(b)(1), unless the authorization is terminated or revoked.  Performed at [facility], [address], [place], [place] [address]   Culture, blood (Routine X 2) w Reflex to ID Panel     Status: Abnormal   Collection Time: 11/23/24  2:33 PM   Specimen: BLOOD RIGHT HAND  Result Value Ref Range Status   Specimen Description   Final    BLOOD RIGHT HAND Performed at [facility], [address] [place], [place] [address]    Special Requests   Final    BOTTLES DRAWN AEROBIC AND ANAEROBIC Blood Culture results may not be optimal due to an inadequate volume of blood received in culture bottles Performed at [facility], [address] [place], [place] [address]    Culture  Setup Time   Final    GRAM POSITIVE COCCI ANAEROBIC BOTTLE ONLY CRITICAL RESULT CALLED TO, READ BACK BY AND VERIFIED WITH: PHARMD JUSTIN LEGGE [id number] AT 1447 BY EC GRAM POSITIVE RODS AEROBIC BOTTLE ONLY PHARMD WOFFORD 11/25/2024 @0655  BY AB    Culture (A)  Final    STAPHYLOCOCCUS HOMINIS STAPHYLOCOCCUS  CAPITIS THE SIGNIFICANCE OF ISOLATING  THIS ORGANISM FROM A SINGLE SET OF BLOOD CULTURES WHEN MULTIPLE SETS ARE DRAWN IS UNCERTAIN. PLEASE NOTIFY THE MICROBIOLOGY DEPARTMENT WITHIN ONE WEEK IF SPECIATION AND SENSITIVITIES ARE REQUIRED. Performed at [facility], [address], [place], [place] [address]    Report Status 11/26/2024 FINAL  Final  Blood Culture ID Panel (Reflexed)     Status: Abnormal   Collection Time: 11/23/24  2:33 PM  Result Value Ref Range Status   Enterococcus faecalis NOT DETECTED NOT DETECTED Final   Enterococcus Faecium NOT DETECTED NOT DETECTED Final   Listeria monocytogenes NOT DETECTED NOT DETECTED Final   Staphylococcus species DETECTED (A) NOT DETECTED Final    Comment: CRITICAL RESULT CALLED TO, READ BACK BY AND VERIFIED WITH: PHARMD JUSTIN LEGGE [id number] AT 1447 BY EC    Staphylococcus aureus (BCID) NOT DETECTED NOT DETECTED Final   Staphylococcus epidermidis NOT DETECTED NOT DETECTED Final   Staphylococcus lugdunensis NOT DETECTED NOT DETECTED Final   Streptococcus species NOT DETECTED NOT DETECTED Final   Streptococcus agalactiae NOT DETECTED NOT DETECTED Final   Streptococcus pneumoniae NOT DETECTED NOT DETECTED Final   Streptococcus pyogenes NOT DETECTED NOT DETECTED Final   A.calcoaceticus-baumannii NOT DETECTED NOT DETECTED Final   Bacteroides fragilis NOT DETECTED NOT DETECTED Final   Enterobacterales NOT DETECTED NOT DETECTED Final   Enterobacter cloacae complex NOT DETECTED NOT DETECTED Final   Escherichia coli NOT DETECTED NOT DETECTED Final   Klebsiella aerogenes NOT DETECTED NOT DETECTED Final   Klebsiella oxytoca NOT DETECTED NOT DETECTED Final   Klebsiella pneumoniae NOT DETECTED NOT DETECTED Final   Proteus species NOT DETECTED NOT DETECTED Final   Salmonella species NOT DETECTED NOT DETECTED Final   Serratia marcescens NOT DETECTED NOT DETECTED Final   Haemophilus influenzae NOT DETECTED NOT DETECTED Final   Neisseria meningitidis  NOT DETECTED NOT DETECTED Final   Pseudomonas aeruginosa NOT DETECTED NOT DETECTED Final   Stenotrophomonas maltophilia NOT DETECTED NOT DETECTED Final   Candida albicans NOT DETECTED NOT DETECTED Final   Candida auris NOT DETECTED NOT DETECTED Final   Candida glabrata NOT DETECTED NOT DETECTED Final   Candida krusei NOT DETECTED NOT DETECTED Final   Candida parapsilosis NOT DETECTED NOT DETECTED Final   Candida tropicalis NOT DETECTED NOT DETECTED Final   Cryptococcus neoformans/gattii NOT DETECTED NOT DETECTED Final    Comment: Performed at [facility], [address] [place], [place] [address]  Culture, blood (Routine X 2) w Reflex to ID Panel     Status: None (Preliminary result)   Collection Time: 11/23/24  7:19 PM   Specimen: BLOOD RIGHT HAND  Result Value Ref Range Status   Specimen Description   Final    BLOOD RIGHT HAND Performed at [facility], [address], [place], [place] [address]    Special Requests   Final    BOTTLES DRAWN AEROBIC ONLY Blood Culture adequate volume Performed at [facility], [address], [place], [place] [address]    Culture   Final    NO GROWTH 3 DAYS Performed at [facility], [address], [place], [place] [address]    Report Status PENDING  Incomplete    Radiology Studies: No results found.     Sharon Mcdaniel [facility]  If 7PM-7AM, please contact night-coverage [email or web address] 11/27/2024, 12:35 PM   "

## 2024-11-27 NOTE — Plan of Care (Signed)
" °  Problem: Education: Goal: Knowledge of General Education information will improve Description: Including pain rating scale, medication(s)/side effects and non-pharmacologic comfort measures Outcome: Progressing   Problem: Activity: Goal: Risk for activity intolerance will decrease Outcome: Progressing   Problem: Coping: Goal: Level of anxiety will decrease Outcome: Progressing   Problem: Respiratory: Goal: Levels of oxygenation will improve Outcome: Progressing   "

## 2024-11-27 NOTE — Progress Notes (Signed)
 Physical Therapy Treatment Patient Details Name: Sharon Mcdaniel MRN: [id number] DOB: 1936/10/14 Today's Date: 11/27/2024   History of Present Illness 89 year old F with PMH of COPD/asthma, pulmonary fibrosis, allergic rhinitis, prior tobacco use that she quit in 2005, PVD, HLD, hypothyroidism and mild cognitive impairment on Aricept  brought to ED by EMS due to shortness of breath, productive cough, wheezing, sore throat, fatigue, malaise, fever and chills, and admitted with working diagnosis of acute respiratory failure with hypoxia due to COPD exacerbation in the setting of influenza A infection.    PT Comments  Pt agreeable to working with therapy. O2 90-92% on RA. Pt tolerated session well. Pt reports she plans to d/c home on today. Will recommend HHPT and RW.     If plan is discharge home, recommend the following: A little help with walking and/or transfers;A little help with bathing/dressing/bathroom;Assistance with cooking/housework;Assist for transportation;Help with stairs or ramp for entrance   Can travel by private vehicle        Equipment Recommendations  Rolling walker (2 wheels)    Recommendations for Other Services       Precautions / Restrictions Precautions Precautions: Fall Restrictions Weight Bearing Restrictions Per Provider Order: No     Mobility  Bed Mobility Overal bed mobility: Modified Independent             General bed mobility comments: increased time and effort for pt.    Transfers Overall transfer level: Needs assistance Equipment used: Rolling walker (2 wheels) Transfers: Sit to/from Stand Sit to Stand: Supervision           General transfer comment: Cues for safety, technique, hand placement. Increased time.    Ambulation/Gait Ambulation/Gait assistance: Contact guard assist Gait Distance (Feet): 60 Feet Assistive device: Rolling walker (2 wheels) Gait Pattern/deviations: Step-through pattern, Decreased stride length        General Gait Details: CGA for safety. Slow gait speed. Pt fatigues fairly easily. Some dyspnea with ambulation.  O2 90-92% on RA.   Stairs             Wheelchair Mobility     Tilt Bed    Modified Rankin (Stroke Patients Only)       Balance Overall balance assessment: Needs assistance         Standing balance support: During functional activity Standing balance-Leahy Scale: Fair                              Hotel Manager: No apparent difficulties  Cognition Arousal: Alert Behavior During Therapy: WFL for tasks assessed/performed                             Following commands: Intact      Cueing Cueing Techniques: Verbal cues  Exercises      General Comments        Pertinent Vitals/Pain Pain Assessment Pain Assessment: Faces Faces Pain Scale: No hurt    Home Living                          Prior Function            PT Goals (current goals can now be found in the care plan section) Progress towards PT goals: Progressing toward goals    Frequency    Min 2X/week      PT Plan  Co-evaluation              AM-PAC PT 6 Clicks Mobility   Outcome Measure  Help needed turning from your back to your side while in a flat bed without using bedrails?: None Help needed moving from lying on your back to sitting on the side of a flat bed without using bedrails?: A Little Help needed moving to and from a bed to a chair (including a wheelchair)?: A Little Help needed standing up from a chair using your arms (e.g., wheelchair or bedside chair)?: A Little Help needed to walk in hospital room?: A Little Help needed climbing 3-5 steps with a railing? : A Little 6 Click Score: 19    End of Session Equipment Utilized During Treatment: Gait belt Activity Tolerance: Patient tolerated treatment well Patient left: in chair;with call bell/phone within reach   PT Visit Diagnosis:  Muscle weakness (generalized) (M62.81);Difficulty in walking, not elsewhere classified (R26.2);Unsteadiness on feet (R26.81)     Time: 8890-8876 PT Time Calculation (min) (ACUTE ONLY): 14 min  Charges:    $Gait Training: 8-22 mins PT General Charges $$ ACUTE PT VISIT: 1 Visit                         Dannial SQUIBB, PT Acute Rehabilitation  Office: [phone]

## 2024-11-27 NOTE — TOC Progression Note (Signed)
 Transition of Care [facility]) - Progression Note    Patient Details  Name: Sharon Mcdaniel MRN: [id number] Date of Birth: 01-27-1936  [facility] CM/SW Contact  Toy LITTIE Agar, RN Phone Number:[phone]  11/27/2024, 1:08 PM  Clinical Narrative:    Inpatient care manager made aware that patient will need HH and DME. CM unable to reach patient via phone. CM spoke with daughter Nathanel via phone. CM explained consults and offered choice. Daughter verifies that patient is from home with her and she helps with all choices. Daughter has no preference. HH referral has been accepted by Angie with [facility]. [facility] DME referral has been accepted by Zachery with [facility]. DME will be delivered to bedside tomorrow for discharge. AVS has been updated. No other needs noted at this time.      Barriers to Discharge: Continued Medical Work up               Expected Discharge Plan and Services                                               Social Drivers of Health (SDOH) Interventions SDOH Screenings   Food Insecurity: No Food Insecurity (11/23/2024)  Housing: Low Risk (11/23/2024)  Transportation Needs: No Transportation Needs (11/23/2024)  Utilities: Not At Risk (11/23/2024)  Social Connections: Moderately Integrated (11/23/2024)  Tobacco Use: Medium Risk (11/24/2024)    Readmission Risk Interventions     No data to display

## 2024-11-28 ENCOUNTER — Other Ambulatory Visit (HOSPITAL_COMMUNITY): Payer: Self-pay

## 2024-11-28 ENCOUNTER — Other Ambulatory Visit: Payer: Self-pay

## 2024-11-28 DIAGNOSIS — E782 Mixed hyperlipidemia: Secondary | ICD-10-CM | POA: Diagnosis not present

## 2024-11-28 DIAGNOSIS — J9601 Acute respiratory failure with hypoxia: Secondary | ICD-10-CM | POA: Diagnosis not present

## 2024-11-28 DIAGNOSIS — K219 Gastro-esophageal reflux disease without esophagitis: Secondary | ICD-10-CM | POA: Diagnosis not present

## 2024-11-28 DIAGNOSIS — J101 Influenza due to other identified influenza virus with other respiratory manifestations: Secondary | ICD-10-CM | POA: Diagnosis not present

## 2024-11-28 LAB — CULTURE, BLOOD (ROUTINE X 2)
Culture: NO GROWTH
Special Requests: ADEQUATE

## 2024-11-28 MED ORDER — LOSARTAN POTASSIUM 50 MG PO TABS
50.0000 mg | ORAL_TABLET | Freq: Every morning | ORAL | 0 refills | Status: AC
  Filled 2024-11-28: qty 90, 90d supply, fill #0

## 2024-11-28 MED ORDER — PREDNISONE 20 MG PO TABS
20.0000 mg | ORAL_TABLET | Freq: Every day | ORAL | 0 refills | Status: AC
  Filled 2024-11-28: qty 2, 2d supply, fill #0

## 2024-11-28 NOTE — Plan of Care (Addendum)
 Patient is alert and oriented X4, pt requesting update on walker and DME has not been delivered to the bedside. Pt's daughter at the front lobby ready to transport her home and they decided to leave without the walker and pick up medications at [facility] on the way home instead. Vannie will be delivered home. MD notified. Daughter called and updated.   Problem: Education: Goal: Knowledge of General Education information will improve Description: Including pain rating scale, medication(s)/side effects and non-pharmacologic comfort measures Outcome: Adequate for Discharge   Problem: Health Behavior/Discharge Planning: Goal: Ability to manage health-related needs will improve Outcome: Adequate for Discharge   Problem: Clinical Measurements: Goal: Ability to maintain clinical measurements within normal limits will improve Outcome: Adequate for Discharge Goal: Will remain free from infection Outcome: Adequate for Discharge Goal: Diagnostic test results will improve Outcome: Adequate for Discharge Goal: Respiratory complications will improve Outcome: Adequate for Discharge Goal: Cardiovascular complication will be avoided Outcome: Adequate for Discharge   Problem: Activity: Goal: Risk for activity intolerance will decrease Outcome: Adequate for Discharge   Problem: Nutrition: Goal: Adequate nutrition will be maintained Outcome: Adequate for Discharge   Problem: Coping: Goal: Level of anxiety will decrease Outcome: Adequate for Discharge   Problem: Elimination: Goal: Will not experience complications related to bowel motility Outcome: Adequate for Discharge Goal: Will not experience complications related to urinary retention Outcome: Adequate for Discharge   Problem: Pain Managment: Goal: General experience of comfort will improve and/or be controlled Outcome: Adequate for Discharge   Problem: Safety: Goal: Ability to remain free from injury will improve Outcome:  Adequate for Discharge   Problem: Skin Integrity: Goal: Risk for impaired skin integrity will decrease Outcome: Adequate for Discharge   Problem: Education: Goal: Knowledge of disease or condition will improve Outcome: Adequate for Discharge Goal: Knowledge of the prescribed therapeutic regimen will improve Outcome: Adequate for Discharge Goal: Individualized Educational Video(s) Outcome: Adequate for Discharge   Problem: Activity: Goal: Ability to tolerate increased activity will improve Outcome: Adequate for Discharge Goal: Will verbalize the importance of balancing activity with adequate rest periods Outcome: Adequate for Discharge   Problem: Respiratory: Goal: Ability to maintain a clear airway will improve Outcome: Adequate for Discharge Goal: Levels of oxygenation will improve Outcome: Adequate for Discharge Goal: Ability to maintain adequate ventilation will improve Outcome: Adequate for Discharge

## 2024-11-28 NOTE — Plan of Care (Signed)
" °  Problem: Activity: Goal: Risk for activity intolerance will decrease Outcome: Progressing   Problem: Nutrition: Goal: Adequate nutrition will be maintained Outcome: Progressing   Problem: Coping: Goal: Level of anxiety will decrease Outcome: Progressing   Problem: Safety: Goal: Ability to remain free from injury will improve Outcome: Progressing   Problem: Activity: Goal: Ability to tolerate increased activity will improve Outcome: Progressing   Problem: Respiratory: Goal: Ability to maintain a clear airway will improve Outcome: Progressing Goal: Ability to maintain adequate ventilation will improve Outcome: Progressing   "

## 2024-11-28 NOTE — Discharge Summary (Signed)
 "  Physician Discharge Summary  Sharon Mcdaniel FMW:[id number] DOB: 01-Aug-1936 DOA: 11/23/2024  PCP: Burney Darice LITTIE, MD  Admit date: 11/23/2024 Discharge date: 11/28/24  Admitted From: Home Disposition: Home Recommendations for Outpatient Follow-up:  Outpatient follow-up with PCP and pulmonologist in 1 to 2 weeks Reassess blood pressure and adjust meds as appropriate. Check CMP and CBC at follow-up Please follow up on the following pending results: None  Home Health: HHPT/OT Equipment/Devices: Rolling walker  Discharge Condition: Stable CODE STATUS: Full code   Contact information for follow-up providers     Burney Darice LITTIE, MD. Schedule an appointment as soon as possible for a visit in 1 week(s).   Specialty: Family Medicine Contact information: [address] [place] [address] [phone]              Contact information for after-discharge care     Home Medical Care     [facility]) .   Service: Home Health Services Contact information: [phone] [facility]  [phone]                     Hospital course 89 year old F with PMH of COPD/asthma, pulmonary fibrosis, allergic rhinitis, prior tobacco use that she quit in 2005, PVD, HLD, hypothyroidism and mild cognitive impairment on Aricept  brought to ED by EMS due to shortness of breath, productive cough, wheezing, sore throat, fatigue, malaise, fever and chills, and admitted with working diagnosis of acute respiratory failure with hypoxia due to COPD exacerbation in the setting of influenza A infection.  Patient was started on prednisone  and doxycycline  the day prior to presentation without significant relief.  Patient received IV Solu-Medrol  and DuoNebs en route to ED   In ED, briefly tachypneic to 30s.  No oxygen requirement.  Slightly elevated BP.  WBC 14.6. Cr 1.21.  Otherwise, BMP and CBC without significant finding.  Lactic acid  2.7.  Influenza A PCR positive.  Influenza B, COVID-19 and RSV nonreactive.  CXR without acute finding.  Blood cultures drawn.  Patient was started on Tamiflu , nebulizers, ceftriaxone  and doxycycline  and admitted for further care.   Patient completed 5 days of Tamiflu  and CAP coverage in house.  Her respiratory symptoms improved significantly.  Maintained saturation in mid 90s with ambulation on room air without significant respiratory distress. She is discharged on p.o. prednisone  20 mg daily for 2 more days.  Encouraged to use her Brovana  and Pulmicort  twice daily and DuoNeb every 6 hours as needed.  Unfortunately, her co-pay is too high for Trelegy, Yupelri  and Incruse.  Patient to follow-up with PCP and pulmonologist in 1 to 2 weeks or sooner if needed.    See individual problem list below for more.   Problems addressed during this hospitalization Influenza A infection-completed 5 days of Tamiflu  in house. COPD with acute exacerbation due to influenza A infection Acute respiratory failure ruled out-no documented desaturation. -CXR without acute finding.  No oxygen requirement. -Blood cultures NGTD.  Lactic acidosis resolved. -Completed 5 days of prednisone , Tamiflu , ceftriaxone  and doxycycline  in house. -Discharged on p.o. prednisone  20 mg daily for 2 more days. -Encouraged to use her Brovana  and Pulmicort  as prescribed, not as needed -Advised to use DuoNebs every 6 hours as needed -Advised to follow-up with pulmonology as soon as possible.  History of postinflammatory pulmonary fibrosis? High-resolution CT in 2017 with no evidence of ILD but stable biapical pleuroparenchymal scarring. - Outpatient follow-up with pulmonology  Uncontrolled hypertension: Improved.  TTE without significant finding. -Continue home Cardizem  CD  -Increased home losartan  from 37.5 mg daily to 50 mg daily.   Lactic acidosis: Doubt sepsis.  Hemodynamically stable.  Resolved.   Positive blood culture: GPC and  GPR in 1 out of 2 anaerobic bottles likely contaminant.   Generalized weakness: Likely due to acute illness.  Independently ambulates at baseline - HHPT/OT and RW ordered as recommended.   Mild cognitive impairment?  Seems to be on Aricept .  She is awake and alert and oriented x 4. - Continue home Aricept  - Reorientation and delirium precaution   PVD/hyperlipidemia - Continue Lipitor aspirin    Hypothyroidism - Continue Synthroid    GERD - Continue PPI   History of tobacco use: Quit smoking in 2005.  Family communication: Updated patient's daughter on the day of discharge.  Body mass index is 26.49 kg/m.           Consultations: None  Time spent 35  minutes  Vital signs Vitals:   11/28/24 0549 11/28/24 0842 11/28/24 0844 11/28/24 0852  BP: (!) 124/107     Pulse: 65     Temp: (!) 97.5 F (36.4 C)     Resp: 18     Height:      Weight:      SpO2: 92% 95% 95% 95%  TempSrc: Oral     BMI (Calculated):         Discharge exam  GENERAL: No apparent distress.  Nontoxic. HEENT: MMM.  Vision and hearing grossly intact.  NECK: Supple.  No apparent JVD.  RESP:  No IWOB.  Fair aeration bilaterally. CVS:  RRR. Heart sounds normal.  ABD/GI/GU: BS+. Abd soft, NTND.  MSK/EXT:  Moves extremities. No apparent deformity. No edema.  SKIN: no apparent skin lesion or wound NEURO: Awake and alert. Oriented appropriately.  No apparent focal neuro deficit. PSYCH: Calm. Normal affect.   Discharge Instructions Discharge Instructions     Discharge instructions   Complete by: As directed    It has been a pleasure taking care of you!  You were hospitalized due to COPD exacerbation in the setting of influenza infection.  Sometimes improved with treatment.  We are discharging you on more prednisone  to complete treatment course.  We have made adjustment to your medications including your nebulizers during this hospitalization.  Please review your new medication list and the directions  on your medications before you take them.  Follow-up with your primary care doctor in 1 to 2 weeks or sooner if needed.   Take care,   Increase activity slowly   Complete by: As directed       Allergies as of 11/28/2024       Reactions   Betadine [povidone Iodine] Rash   Fluconazole In Dextrose  Hives, Shortness Of Breath   Gatifloxacin Hives, Shortness Of Breath   Iodinated Contrast Media Hives, Shortness Of Breath, Rash, Other (See Comments)   States she tolerates betadine   Ioxaglate Hives, Shortness Of Breath   Quinolones Hives, Shortness Of Breath   Tequin Hives, Shortness Of Breath   Alendronate Other (See Comments)   Tingling   Bee Venom Hives   Zithromax  [azithromycin ]    GI Upset   Codeine Anxiety, Other (See Comments)   Made the patient jittery        Medication List     STOP taking these medications    diltiazem  30 MG tablet Commonly known as: CARDIZEM    doxycycline  100 MG capsule Commonly  known as: VIBRAMYCIN    famotidine  20 MG tablet Commonly known as: Pepcid    ibuprofen  200 MG tablet Commonly known as: ADVIL    meloxicam 7.5 MG tablet Commonly known as: MOBIC       TAKE these medications    rOPINIRole 0.25 MG tablet Commonly known as: REQUIP Take 0.25-0.5 mg by mouth at bedtime as needed (for leg cramps). The timing of this medication is very important.   acetaminophen  325 MG tablet Commonly known as: TYLENOL  Take 2 tablets (650 mg total) by mouth every 6 (six) hours as needed for mild pain (pain score 1-3) or fever (or Fever >/= 101).   albuterol  108 (90 Base) MCG/ACT inhaler Commonly known as: VENTOLIN  HFA Inhale 2 puffs into the lungs every 8 (eight) hours as needed for wheezing or shortness of breath.   arformoterol  15 MCG/2ML Nebu Commonly known as: BROVANA  Take 2 mLs (15 mcg total) by nebulization 2 (two) times daily. What changed:  when to take this reasons to take this   Aspirin  81 MG Caps Take 81 mg by mouth at  bedtime.   atorvastatin  40 MG tablet Commonly known as: LIPITOR TAKE 1 TABLET BY MOUTH DAILY AT BEDTIME   budesonide  0.5 MG/2ML nebulizer solution Commonly known as: Pulmicort  Take 2 mLs (0.5 mg total) by nebulization in the morning and at bedtime.   clobetasol cream 0.05 % Commonly known as: TEMOVATE Apply 1 Application topically 2 (two) times daily as needed (for dermatitis).   clonazePAM  0.5 MG tablet Commonly known as: KLONOPIN  Take 0.5 mg by mouth at bedtime.   diltiazem  120 MG 24 hr capsule Commonly known as: CARDIZEM  CD Take 1 capsule (120 mg total) by mouth daily.   diphenhydrAMINE  25 MG tablet Commonly known as: BENADRYL  Take 50 mg by mouth once as needed (for bee stings).   donepezil  10 MG tablet Commonly known as: ARICEPT  Take 10 mg by mouth at bedtime.   fluticasone  50 MCG/ACT nasal spray Commonly known as: FLONASE Place 1 spray into both nostrils daily as needed for allergies or rhinitis.   furosemide  20 MG tablet Commonly known as: LASIX  Take 20 mg by mouth See admin instructions. Take 20 mg by mouth EVERY OTHER morning   ipratropium-albuterol  0.5-2.5 (3) MG/3ML Soln Commonly known as: DUONEB Take 3 mLs by nebulization every 6 (six) hours as needed. What changed:  when to take this reasons to take this   levothyroxine  112 MCG tablet Commonly known as: SYNTHROID  Take 112 mcg by mouth daily before breakfast.   losartan  50 MG tablet Commonly known as: COZAAR  Take 1 tablet (50 mg total) by mouth in the morning. What changed:  medication strength how much to take   meclizine 25 MG tablet Commonly known as: ANTIVERT Take 25 mg by mouth daily as needed for dizziness.   mirtazapine  15 MG tablet Commonly known as: REMERON  Take 15 mg by mouth at bedtime.   montelukast  10 MG tablet Commonly known as: SINGULAIR  Take 1 tablet (10 mg total) by mouth daily. What changed: when to take this   multivitamin tablet Take 1 tablet by mouth daily with  breakfast.   nitroGLYCERIN  0.4 MG SL tablet Commonly known as: NITROSTAT  Place 1 tablet (0.4 mg total) under the tongue every 5 (five) minutes as needed for chest pain.   ondansetron  8 MG tablet Commonly known as: ZOFRAN  Take 8 mg by mouth every 8 (eight) hours as needed for nausea or vomiting.   pantoprazole  40 MG tablet Commonly known as: PROTONIX  TAKE  1 TABLET BY MOUTH DAILY. TAKE 30-60 MINUTES BEFORE FIRST MEAL OF THE DAY What changed: See the new instructions.   predniSONE  20 MG tablet Commonly known as: DELTASONE  Take 1 tablet (20 mg total) by mouth daily with breakfast for 2 days. What changed: Another medication with the same name was removed. Continue taking this medication, and follow the directions you see here.   traMADol 50 MG tablet Commonly known as: ULTRAM Take 25 mg by mouth every 8 (eight) hours as needed (for pain).               Durable Medical Equipment  (From admission, onward)           Start     Ordered   11/27/24 1120  For home use only DME Walker rolling  Once       Question Answer Comment  Walker: With 5 Inch Wheels   Patient needs a walker to treat with the following condition Generalized weakness      11/27/24 1119             Procedures/Studies:   ECHOCARDIOGRAM COMPLETE Result Date: 11/25/2024    ECHOCARDIOGRAM REPORT   Patient Name:   Sharon Mcdaniel Date of Exam: 11/25/2024 Medical Rec #:  [id number]     Height:       62.0 in Accession #:    [id number]    Weight:       144.8 lb Date of Birth:  [birth year]      BSA:          1.667 m Patient Age:    88 years      BP:           171/48 mmHg Patient Gender: F             HR:           66 bpm. Exam Location:  Inpatient Procedure: 2D Echo, Cardiac Doppler and Color Doppler (Both Spectral and Color            Flow Doppler were utilized during procedure). Indications:    Other abnormalities of the heart R00.8  History:        Patient has prior history of Echocardiogram examinations, most                  recent 03/04/2022.  Sonographer:    Sydnee Wilson RDCS Referring Phys: [id number] MIGNON DASEN Traveion Ruddock IMPRESSIONS  1. Left ventricular ejection fraction, by estimation, is 65 to 70%. Left ventricular ejection fraction by 2D MOD biplane is 64.0 %. The left ventricle has normal function. The left ventricle has no regional wall motion abnormalities. Left ventricular diastolic parameters are consistent with Grade I diastolic dysfunction (impaired relaxation).  2. Right ventricular systolic function is normal. The right ventricular size is normal.  3. The mitral valve is normal in structure. No evidence of mitral valve regurgitation. No evidence of mitral stenosis.  4. The aortic valve is normal in structure. Aortic valve regurgitation is trivial. No aortic stenosis is present.  5. The inferior vena cava is normal in size with greater than 50% respiratory variability, suggesting right atrial pressure of 3 mmHg. Comparison(s): No significant change from prior study. FINDINGS  Left Ventricle: Left ventricular ejection fraction, by estimation, is 65 to 70%. Left ventricular ejection fraction by 2D MOD biplane is 64.0 %. The left ventricle has normal function. The left ventricle has no regional wall motion abnormalities. The left ventricular internal cavity size was normal  in size. There is no left ventricular hypertrophy. Left ventricular diastolic parameters are consistent with Grade I diastolic dysfunction (impaired relaxation). Right Ventricle: The right ventricular size is normal. No increase in right ventricular wall thickness. Right ventricular systolic function is normal. Left Atrium: Left atrial size was normal in size. Right Atrium: Right atrial size was normal in size. Pericardium: There is no evidence of pericardial effusion. Mitral Valve: The mitral valve is normal in structure. No evidence of mitral valve regurgitation. No evidence of mitral valve stenosis. Tricuspid Valve: The tricuspid valve is normal in  structure. Tricuspid valve regurgitation is not demonstrated. No evidence of tricuspid stenosis. Aortic Valve: The aortic valve is normal in structure. Aortic valve regurgitation is trivial. No aortic stenosis is present. Aortic valve mean gradient measures 5.0 mmHg. Aortic valve peak gradient measures 8.9 mmHg. Aortic valve area, by VTI measures 2.85 cm. Pulmonic Valve: The pulmonic valve was normal in structure. Pulmonic valve regurgitation is not visualized. No evidence of pulmonic stenosis. Aorta: The aortic root is normal in size and structure. Venous: The inferior vena cava is normal in size with greater than 50% respiratory variability, suggesting right atrial pressure of 3 mmHg. IAS/Shunts: The interatrial septum was not well visualized.  LEFT VENTRICLE PLAX 2D                        Biplane EF (MOD) LVIDd:         3.60 cm         LV Biplane EF:   Left LVIDs:         2.30 cm                          ventricular LV PW:         1.00 cm                          ejection LV IVS:        0.90 cm                          fraction by LVOT diam:     2.00 cm                          2D MOD LV SV:         94                               biplane is LV SV Index:   56                               64.0 %. LVOT Area:     3.14 cm                                Diastology                                LV e' medial:    6.09 cm/s LV Volumes (MOD)               LV E/e' medial:  19.2 LV vol d,  MOD    64.5 ml       LV e' lateral:   7.07 cm/s A2C:                           LV E/e' lateral: 16.5 LV vol d, MOD    64.5 ml A4C: LV vol s, MOD    25.2 ml A2C: LV vol s, MOD    23.0 ml A4C: LV SV MOD A2C:   39.3 ml LV SV MOD A4C:   64.5 ml LV SV MOD BP:    42.8 ml RIGHT VENTRICLE RV S prime:     16.60 cm/s TAPSE (M-mode): 2.4 cm LEFT ATRIUM             Index        RIGHT ATRIUM          Index LA diam:        2.50 cm 1.50 cm/m   RA Area:     9.90 cm LA Vol (A2C):   41.0 ml 24.60 ml/m  RA Volume:   19.60 ml 11.76 ml/m LA Vol (A4C):    32.2 ml 19.32 ml/m LA Biplane Vol: 37.2 ml 22.32 ml/m  AORTIC VALVE AV Area (Vmax):    2.70 cm AV Area (Vmean):   2.19 cm AV Area (VTI):     2.85 cm AV Vmax:           149.00 cm/s AV Vmean:          105.000 cm/s AV VTI:            0.329 m AV Peak Grad:      8.9 mmHg AV Mean Grad:      5.0 mmHg LVOT Vmax:         128.00 cm/s LVOT Vmean:        73.300 cm/s LVOT VTI:          0.298 m LVOT/AV VTI ratio: 0.91  AORTA Ao Root diam: 3.60 cm Ao Asc diam:  3.10 cm MITRAL VALVE                TRICUSPID VALVE MV Area (PHT): 1.90 cm     TR Peak grad:   29.2 mmHg MV E velocity: 117.00 cm/s  TR Vmax:        270.00 cm/s MV A velocity: 131.00 cm/s MV E/A ratio:  0.89         SHUNTS                             Systemic VTI:  0.30 m                             Systemic Diam: 2.00 cm Joelle Azobou Tonleu Electronically signed by Joelle Cedars Tonleu Signature Date/Time: 11/25/2024/4:24:01 PM    Final    DG Chest 2 View Result Date: 11/23/2024 CLINICAL DATA:  Shortness of breath and body aches. EXAM: CHEST - 2 VIEW COMPARISON:  July 07, 2022 FINDINGS: The heart size and mediastinal contours are within normal limits. There is marked severity calcification of the thoracic aorta. No acute infiltrate, pleural effusion or pneumothorax is identified. A stable, subcentimeter likely benign nodular opacity is seen overlying the medial aspect of the left lung base. The visualized skeletal structures are unremarkable. IMPRESSION: No active cardiopulmonary disease. Electronically Signed   By: Suzen  Houston M.D.   On: 11/23/2024 13:05       The results of significant diagnostics from this hospitalization (including imaging, microbiology, ancillary and laboratory) are listed below for reference.     Microbiology: Recent Results (from the past 240 hours)  Resp panel by RT-PCR (RSV, Flu A&B, Covid) Anterior Nasal Swab     Status: Abnormal   Collection Time: 11/23/24  1:31 PM   Specimen: Anterior Nasal Swab  Result Value  Ref Range Status   SARS Coronavirus 2 by RT PCR NEGATIVE NEGATIVE Final    Comment: (NOTE) SARS-CoV-2 target nucleic acids are NOT DETECTED.  The SARS-CoV-2 RNA is generally detectable in upper respiratory specimens during the acute phase of infection. The lowest concentration of SARS-CoV-2 viral copies this assay can detect is 138 copies/mL. A negative result does not preclude SARS-Cov-2 infection and should not be used as the sole basis for treatment or other patient management decisions. A negative result may occur with  improper specimen collection/handling, submission of specimen other than nasopharyngeal swab, presence of viral mutation(s) within the areas targeted by this assay, and inadequate number of viral copies(<138 copies/mL). A negative result must be combined with clinical observations, patient history, and epidemiological information. The expected result is Negative.  Fact Sheet for Patients:  bloggercourse.com  Fact Sheet for Healthcare Providers:  seriousbroker.it  This test is no t yet approved or cleared by the [place]  FDA and  has been authorized for detection and/or diagnosis of SARS-CoV-2 by FDA under an Emergency Use Authorization (EUA). This EUA will remain  in effect (meaning this test can be used) for the duration of the COVID-19 declaration under Section 564(b)(1) of the Act, 21 U.S.C.section 360bbb-3(b)(1), unless the authorization is terminated  or revoked sooner.       Influenza A by PCR POSITIVE (A) NEGATIVE Final   Influenza B by PCR NEGATIVE NEGATIVE Final    Comment: (NOTE) The Xpert Xpress SARS-CoV-2/FLU/RSV plus assay is intended as an aid in the diagnosis of influenza from Nasopharyngeal swab specimens and should not be used as a sole basis for treatment. Nasal washings and aspirates are unacceptable for Xpert Xpress SARS-CoV-2/FLU/RSV testing.  Fact Sheet for  Patients: bloggercourse.com  Fact Sheet for Healthcare Providers: seriousbroker.it  This test is not yet approved or cleared by the [place]  FDA and has been authorized for detection and/or diagnosis of SARS-CoV-2 by FDA under an Emergency Use Authorization (EUA). This EUA will remain in effect (meaning this test can be used) for the duration of the COVID-19 declaration under Section 564(b)(1) of the Act, 21 U.S.C. section 360bbb-3(b)(1), unless the authorization is terminated or revoked.     Resp Syncytial Virus by PCR NEGATIVE NEGATIVE Final    Comment: (NOTE) Fact Sheet for Patients: bloggercourse.com  Fact Sheet for Healthcare Providers: seriousbroker.it  This test is not yet approved or cleared by the [place]  FDA and has been authorized for detection and/or diagnosis of SARS-CoV-2 by FDA under an Emergency Use Authorization (EUA). This EUA will remain in effect (meaning this test can be used) for the duration of the COVID-19 declaration under Section 564(b)(1) of the Act, 21 U.S.C. section 360bbb-3(b)(1), unless the authorization is terminated or revoked.  Performed at [facility], [address] [place], [place] [address]   Culture, blood (Routine X 2) w Reflex to ID Panel     Status: Abnormal   Collection Time: 11/23/24  2:33 PM   Specimen: BLOOD RIGHT HAND  Result Value  Ref Range Status   Specimen Description   Final    BLOOD RIGHT HAND Performed at [facility], [address], [place], [place] [address]    Special Requests   Final    BOTTLES DRAWN AEROBIC AND ANAEROBIC Blood Culture results may not be optimal due to an inadequate volume of blood received in culture bottles Performed at [facility], [address], [place], [place] [address]    Culture  Setup Time   Final    GRAM POSITIVE COCCI ANAEROBIC  BOTTLE ONLY CRITICAL RESULT CALLED TO, READ BACK BY AND VERIFIED WITH: PHARMD JUSTIN LEGGE [id number] AT 1447 BY EC GRAM POSITIVE RODS AEROBIC BOTTLE ONLY PHARMD WOFFORD 11/25/2024 @0655  BY AB    Culture (A)  Final    STAPHYLOCOCCUS HOMINIS STAPHYLOCOCCUS CAPITIS THE SIGNIFICANCE OF ISOLATING THIS ORGANISM FROM A SINGLE SET OF BLOOD CULTURES WHEN MULTIPLE SETS ARE DRAWN IS UNCERTAIN. PLEASE NOTIFY THE MICROBIOLOGY DEPARTMENT WITHIN ONE WEEK IF SPECIATION AND SENSITIVITIES ARE REQUIRED. Performed at [facility], [address] [place], [place] [address]    Report Status 11/26/2024 FINAL  Final  Blood Culture ID Panel (Reflexed)     Status: Abnormal   Collection Time: 11/23/24  2:33 PM  Result Value Ref Range Status   Enterococcus faecalis NOT DETECTED NOT DETECTED Final   Enterococcus Faecium NOT DETECTED NOT DETECTED Final   Listeria monocytogenes NOT DETECTED NOT DETECTED Final   Staphylococcus species DETECTED (A) NOT DETECTED Final    Comment: CRITICAL RESULT CALLED TO, READ BACK BY AND VERIFIED WITH: PHARMD JUSTIN LEGGE [id number] AT 1447 BY EC    Staphylococcus aureus (BCID) NOT DETECTED NOT DETECTED Final   Staphylococcus epidermidis NOT DETECTED NOT DETECTED Final   Staphylococcus lugdunensis NOT DETECTED NOT DETECTED Final   Streptococcus species NOT DETECTED NOT DETECTED Final   Streptococcus agalactiae NOT DETECTED NOT DETECTED Final   Streptococcus pneumoniae NOT DETECTED NOT DETECTED Final   Streptococcus pyogenes NOT DETECTED NOT DETECTED Final   A.calcoaceticus-baumannii NOT DETECTED NOT DETECTED Final   Bacteroides fragilis NOT DETECTED NOT DETECTED Final   Enterobacterales NOT DETECTED NOT DETECTED Final   Enterobacter cloacae complex NOT DETECTED NOT DETECTED Final   Escherichia coli NOT DETECTED NOT DETECTED Final   Klebsiella aerogenes NOT DETECTED NOT DETECTED Final   Klebsiella oxytoca NOT DETECTED NOT DETECTED Final   Klebsiella pneumoniae NOT DETECTED  NOT DETECTED Final   Proteus species NOT DETECTED NOT DETECTED Final   Salmonella species NOT DETECTED NOT DETECTED Final   Serratia marcescens NOT DETECTED NOT DETECTED Final   Haemophilus influenzae NOT DETECTED NOT DETECTED Final   Neisseria meningitidis NOT DETECTED NOT DETECTED Final   Pseudomonas aeruginosa NOT DETECTED NOT DETECTED Final   Stenotrophomonas maltophilia NOT DETECTED NOT DETECTED Final   Candida albicans NOT DETECTED NOT DETECTED Final   Candida auris NOT DETECTED NOT DETECTED Final   Candida glabrata NOT DETECTED NOT DETECTED Final   Candida krusei NOT DETECTED NOT DETECTED Final   Candida parapsilosis NOT DETECTED NOT DETECTED Final   Candida tropicalis NOT DETECTED NOT DETECTED Final   Cryptococcus neoformans/gattii NOT DETECTED NOT DETECTED Final    Comment: Performed at [facility], [address], [place], [place] [address]  Culture, blood (Routine X 2) w Reflex to ID Panel     Status: None   Collection Time: 11/23/24  7:19 PM   Specimen: BLOOD RIGHT HAND  Result Value Ref Range Status   Specimen  Description   Final    BLOOD RIGHT HAND Performed at [facility], [address] [place], [place] [address]    Special Requests   Final    BOTTLES DRAWN AEROBIC ONLY Blood Culture adequate volume Performed at [facility], [address], [place], [place] [address]    Culture   Final    NO GROWTH 5 DAYS Performed at [facility], [address], [place], [place] [address]    Report Status 11/28/2024 FINAL  Final     Labs:  CBC: Recent Labs  Lab 11/23/24 1219 11/24/24 0835 11/25/24 0546 11/26/24 0659 11/27/24 0521  WBC 14.6* 8.7 7.3 7.6 8.0  HGB 13.5 12.7 12.0 11.1* 11.7*  HCT 43.6 39.9 38.5 35.0* 35.6*  MCV 90.6 88.1 90.2 87.9 85.0  PLT 151 154 130* 151 138*   BMP &GFR Recent Labs  Lab 11/23/24 1219 11/24/24 0835 11/25/24 0546 11/26/24 0659 11/27/24 0521  NA 136 137 139 138 138  K 3.7 3.6 3.5 4.1  3.8  CL 97* 99 104 103 102  CO2 24 24 23 26 27   GLUCOSE 108* 105* 86 83 85  BUN 21 23 22 18 20   CREATININE 1.21* 1.04* 0.96 0.87 0.88  CALCIUM  9.0 9.0 8.6* 8.7* 8.8*  MG  --   --  2.1 2.0 1.9  PHOS  --   --  3.4 3.3 3.8   Estimated Creatinine Clearance: 40.8 mL/min (by C-G formula based on SCr of 0.88 mg/dL). Liver & Pancreas: Recent Labs  Lab 11/24/24 0835 11/25/24 0546 11/26/24 0659 11/27/24 0521  AST 26  --   --   --   ALT 15  --   --   --   ALKPHOS 89  --   --   --   BILITOT 0.2  --   --   --   PROT 6.4*  --   --   --   ALBUMIN 3.7 3.3* 3.1* 3.2*   No results for input(s): LIPASE, AMYLASE in the last 168 hours. No results for input(s): AMMONIA in the last 168 hours. Diabetic: No results for input(s): HGBA1C in the last 72 hours. No results for input(s): GLUCAP in the last 168 hours. Cardiac Enzymes: No results for input(s): CKTOTAL, CKMB, CKMBINDEX, TROPONINI in the last 168 hours. No results for input(s): PROBNP in the last 8760 hours. Coagulation Profile: No results for input(s): INR, PROTIME in the last 168 hours. Thyroid  Function Tests: No results for input(s): TSH, T4TOTAL, FREET4, T3FREE, THYROIDAB in the last 72 hours. Lipid Profile: No results for input(s): CHOL, HDL, LDLCALC, TRIG, CHOLHDL, LDLDIRECT in the last 72 hours. Anemia Panel: No results for input(s): VITAMINB12, FOLATE, FERRITIN, TIBC, IRON, RETICCTPCT in the last 72 hours. Urine analysis:    Component Value Date/Time   COLORURINE YELLOW 04/27/2020 1958   APPEARANCEUR CLEAR 04/27/2020 1958   LABSPEC 1.011 04/27/2020 1958   PHURINE 6.0 04/27/2020 1958   GLUCOSEU NEGATIVE 04/27/2020 1958   HGBUR NEGATIVE 04/27/2020 1958   BILIRUBINUR NEGATIVE 04/27/2020 1958   KETONESUR NEGATIVE 04/27/2020 1958   PROTEINUR NEGATIVE 04/27/2020 1958   UROBILINOGEN 0.2 11/03/2011 1220   NITRITE NEGATIVE 04/27/2020 1958   LEUKOCYTESUR MODERATE (A)  04/27/2020 1958   Sepsis Labs: Invalid input(s): PROCALCITONIN, LACTICIDVEN   SIGNED:  Teoman Giraud T Renetta Suman, MD  [facility] 11/28/2024, 11:03 AM   "

## 2024-12-06 ENCOUNTER — Other Ambulatory Visit (HOSPITAL_COMMUNITY): Payer: Self-pay
